# Patient Record
Sex: Female | Born: 1960 | Race: Black or African American | Hispanic: No | Smoking: Never smoker
Health system: Southern US, Community
[De-identification: ages and names within clinical notes are randomized; demographics above are authoritative.]

## PROBLEM LIST (undated history)

## (undated) DIAGNOSIS — G40909 Epilepsy, unspecified, not intractable, without status epilepticus: Secondary | ICD-10-CM

## (undated) DIAGNOSIS — J45909 Unspecified asthma, uncomplicated: Secondary | ICD-10-CM

## (undated) DIAGNOSIS — T7840XA Allergy, unspecified, initial encounter: Secondary | ICD-10-CM

## (undated) DIAGNOSIS — I10 Essential (primary) hypertension: Secondary | ICD-10-CM

## (undated) DIAGNOSIS — D649 Anemia, unspecified: Secondary | ICD-10-CM

## (undated) HISTORY — DX: Epilepsy, unspecified, not intractable, without status epilepticus: G40.909

## (undated) HISTORY — DX: Unspecified asthma, uncomplicated: J45.909

## (undated) HISTORY — DX: Essential (primary) hypertension: I10

## (undated) HISTORY — PX: BREAST SURGERY: SHX581

## (undated) HISTORY — DX: Allergy, unspecified, initial encounter: T78.40XA

## (undated) HISTORY — DX: Anemia, unspecified: D64.9

## (undated) HISTORY — PX: BREAST EXCISIONAL BIOPSY: SUR124

---

## 2004-04-02 ENCOUNTER — Ambulatory Visit: Payer: Self-pay

## 2004-04-10 ENCOUNTER — Ambulatory Visit: Payer: Self-pay

## 2004-10-10 ENCOUNTER — Ambulatory Visit: Payer: Self-pay | Admitting: General Surgery

## 2005-04-12 ENCOUNTER — Ambulatory Visit: Payer: Self-pay | Admitting: General Surgery

## 2005-07-08 ENCOUNTER — Emergency Department: Payer: Self-pay | Admitting: Unknown Physician Specialty

## 2005-07-08 ENCOUNTER — Other Ambulatory Visit: Payer: Self-pay

## 2006-04-16 ENCOUNTER — Ambulatory Visit: Payer: Self-pay | Admitting: General Surgery

## 2006-05-07 ENCOUNTER — Ambulatory Visit: Payer: Self-pay

## 2006-09-30 DIAGNOSIS — D509 Iron deficiency anemia, unspecified: Secondary | ICD-10-CM | POA: Insufficient documentation

## 2007-01-11 ENCOUNTER — Other Ambulatory Visit: Payer: Self-pay

## 2007-01-11 ENCOUNTER — Emergency Department: Payer: Self-pay | Admitting: Emergency Medicine

## 2007-04-21 ENCOUNTER — Ambulatory Visit: Payer: Self-pay | Admitting: General Surgery

## 2007-06-11 ENCOUNTER — Emergency Department: Payer: Self-pay | Admitting: Emergency Medicine

## 2007-06-11 ENCOUNTER — Other Ambulatory Visit: Payer: Self-pay

## 2007-06-15 ENCOUNTER — Ambulatory Visit: Payer: Self-pay | Admitting: Family Medicine

## 2007-06-15 ENCOUNTER — Ambulatory Visit: Payer: Self-pay | Admitting: Emergency Medicine

## 2007-08-10 ENCOUNTER — Ambulatory Visit: Payer: Self-pay | Admitting: Oncology

## 2007-08-11 ENCOUNTER — Ambulatory Visit: Payer: Self-pay | Admitting: Oncology

## 2007-09-06 ENCOUNTER — Emergency Department (HOSPITAL_COMMUNITY): Admission: EM | Admit: 2007-09-06 | Discharge: 2007-09-06 | Payer: Self-pay | Admitting: Emergency Medicine

## 2007-09-09 ENCOUNTER — Ambulatory Visit: Payer: Self-pay | Admitting: Oncology

## 2007-09-22 ENCOUNTER — Emergency Department: Payer: Self-pay | Admitting: Emergency Medicine

## 2007-09-22 ENCOUNTER — Other Ambulatory Visit: Payer: Self-pay

## 2007-10-10 ENCOUNTER — Ambulatory Visit: Payer: Self-pay | Admitting: Oncology

## 2007-12-10 ENCOUNTER — Ambulatory Visit: Payer: Self-pay | Admitting: Oncology

## 2007-12-28 ENCOUNTER — Ambulatory Visit: Payer: Self-pay | Admitting: Family Medicine

## 2008-01-05 ENCOUNTER — Ambulatory Visit: Payer: Self-pay | Admitting: Oncology

## 2008-01-10 ENCOUNTER — Ambulatory Visit: Payer: Self-pay | Admitting: Oncology

## 2008-01-13 ENCOUNTER — Ambulatory Visit: Payer: Self-pay | Admitting: Family Medicine

## 2008-02-25 ENCOUNTER — Emergency Department: Payer: Self-pay | Admitting: Emergency Medicine

## 2008-03-11 ENCOUNTER — Ambulatory Visit: Payer: Self-pay | Admitting: Oncology

## 2008-04-05 ENCOUNTER — Ambulatory Visit: Payer: Self-pay | Admitting: Oncology

## 2008-04-11 ENCOUNTER — Ambulatory Visit: Payer: Self-pay | Admitting: Oncology

## 2008-04-13 ENCOUNTER — Ambulatory Visit: Payer: Self-pay | Admitting: General Surgery

## 2008-04-20 ENCOUNTER — Ambulatory Visit: Payer: Self-pay | Admitting: General Surgery

## 2008-05-19 ENCOUNTER — Emergency Department: Payer: Self-pay

## 2008-06-09 ENCOUNTER — Ambulatory Visit: Payer: Self-pay | Admitting: Oncology

## 2008-07-05 ENCOUNTER — Ambulatory Visit: Payer: Self-pay | Admitting: Oncology

## 2008-07-09 ENCOUNTER — Ambulatory Visit: Payer: Self-pay | Admitting: Oncology

## 2008-09-08 ENCOUNTER — Ambulatory Visit: Payer: Self-pay | Admitting: Oncology

## 2008-10-04 ENCOUNTER — Ambulatory Visit: Payer: Self-pay | Admitting: Oncology

## 2008-10-09 ENCOUNTER — Ambulatory Visit: Payer: Self-pay | Admitting: Oncology

## 2009-03-11 ENCOUNTER — Ambulatory Visit: Payer: Self-pay | Admitting: Oncology

## 2009-04-06 ENCOUNTER — Ambulatory Visit: Payer: Self-pay | Admitting: Oncology

## 2009-04-11 ENCOUNTER — Ambulatory Visit: Payer: Self-pay | Admitting: Oncology

## 2009-04-14 ENCOUNTER — Ambulatory Visit: Payer: Self-pay | Admitting: General Surgery

## 2009-09-08 ENCOUNTER — Ambulatory Visit: Payer: Self-pay | Admitting: Oncology

## 2009-10-04 ENCOUNTER — Ambulatory Visit: Payer: Self-pay | Admitting: Oncology

## 2009-10-09 ENCOUNTER — Ambulatory Visit: Payer: Self-pay | Admitting: Oncology

## 2009-11-09 ENCOUNTER — Ambulatory Visit: Payer: Self-pay | Admitting: Oncology

## 2009-11-29 ENCOUNTER — Ambulatory Visit: Payer: Self-pay | Admitting: Oncology

## 2009-11-29 LAB — HM PAP SMEAR: HM PAP: NORMAL

## 2009-12-09 ENCOUNTER — Ambulatory Visit: Payer: Self-pay | Admitting: Oncology

## 2010-02-28 ENCOUNTER — Ambulatory Visit: Payer: Self-pay | Admitting: Oncology

## 2010-03-11 ENCOUNTER — Ambulatory Visit: Payer: Self-pay | Admitting: Oncology

## 2010-05-30 ENCOUNTER — Ambulatory Visit: Payer: Self-pay | Admitting: Oncology

## 2010-06-10 ENCOUNTER — Ambulatory Visit: Payer: Self-pay | Admitting: Oncology

## 2010-06-20 ENCOUNTER — Ambulatory Visit: Payer: Self-pay | Admitting: Family Medicine

## 2010-06-28 ENCOUNTER — Ambulatory Visit: Payer: Self-pay | Admitting: Family Medicine

## 2010-07-16 ENCOUNTER — Encounter: Payer: Self-pay | Admitting: Family Medicine

## 2010-07-24 NOTE — Assessment & Plan Note (Signed)
NAME:  Paula [name], Paula [name]               ACCOUNT NO.:  [id number]   MEDICAL RECORD NO.:  [id number]          PATIENT TYPE:  POB   LOCATION:  [facility] at [facility]         FACILITY:  [facility]   PHYSICIAN:  Tinnie Gens, MD        DATE OF BIRTH:  September 14, 1960   DATE OF SERVICE:  12/28/2007                                  CLINIC NOTE   CHIEF COMPLAINT:  Yearly exam.   HISTORY OF PRESENT ILLNESS:  The patient is a 50 year old nulligravida  who has a history of epilepsy.  She has had IUD in for the past 5 years.  She reports IUD was placed for abnormal uterine bleeding, iron  deficiency anemia, and this was worked really well.  She has an  occasional bleeding, but not really a lot.  She is interested in full  hysterectomy.  She is not currently sexually active as her partner has  diabetes and is unable to perform.  The patient would be issued in  replacement of her IUD.   PAST MEDICAL HISTORY:  Significant for:  1. Epilepsy.  2. Asthma.  3. Sleep apnea.   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  This continues to be followed with  annual mammograms by a general surgeon and breast exams.  Her last one  was in August 2009.   MEDICATIONS:  1. She is on Keppra 2 p.o. b.i.d.  2. Tegretol 1 p.o. t.i.d.  3. Topamax 1 p.o. t.i.d.  4. Lamictal 1 p.o. t.i.d.  She is currently weaning the Lamictal to up      and increasing the Keppra to help her with seizure control.   ALLERGIES:  None known.   OBSTETRICAL HISTORY:  She is P0 and G1, history of menarche at age 32.  She has IUD in place.  No history of abnormal Pap smear.  Last was  approximately 2 years ago.   FAMILY HISTORY:  Hypertension in her mother and breast cancer in her  mother diagnosed at age 1.   SOCIAL HISTORY:  The patient is worked for a while.  She does not smoke  or drink alcohol.  She does drink caffeinated beverages.   REVIEW OF SYSTEMS:  A 14-point review of  systems is reviewed.  Please  see GYN history in the chart.  She reports a little bit of bruising and  some coughing up blood or phlegm.  She does have gums bleeding and she  is on CPAP and does have some nasal dryness.  She also reports loss of  urine, not necessarily with coughing or sneezing.  She founds more of  her problem when she really has to go the bathroom that she loses her  urine.   PHYSICAL EXAMINATION:  VITAL SIGNS:  Her vitals are as in the chart.  Weight is 287.5 and blood pressure 138/98.  GENERAL:  She is a morbidly obese female.  GU:  Normal external female genitalia.  BUS normal.  Vagina is pink and  rugated.  Cervix is nulliparous without lesion.  Uterus is small,  anteverted.  No adnexal mass or  tenderness, although exam is markedly  limited by body habitus.   PROCEDURE:  On visualization of the cervix, intrauterine device strings  are easily visualized.  Her intrauterine device is removed without  difficulty.  Cervix has been grasped anteriorly with single-toothed  tenaculum.  The uterus was sounded to 8 cm and intrauterine device was  placed inside the uterine cavity.  Strings were trimmed to 1.5 cm.  The  patient tolerated the procedure well.   IMPRESSION:  1. Yearly exam.  2. History of menorrhagia, well controlled with intrauterine device      status post removal and replacement of intrauterine device today.   PLAN:  Continue mammograms per surgery.  Check Pap smear results.  Follow up with primary care for blood pressure issues and other issues.  Return in 2 weeks for IUD string check.           ______________________________  Tinnie Gens, MD     TP/MEDQ  D:  12/28/2007  T:  12/29/2007  Job:  [id number]

## 2010-07-27 NOTE — Assessment & Plan Note (Signed)
NAME:  Paula [name], Paula [name]               ACCOUNT NO.:  [id number]   MEDICAL RECORD NO.:  [id number]          PATIENT TYPE:  POB   LOCATION:  [facility] at [facility]         FACILITY:  [facility]   PHYSICIAN:  Tinnie Gens, MD        DATE OF BIRTH:  July 06, 1960   DATE OF SERVICE:                                  CLINIC NOTE   CHIEF COMPLAINT:  Followup IUD.   HISTORY:  This patient is a 50 year old female who has an IUD in place.  She has had no bleeding since her IUD was inserted.  She says she feels  well and doing fine.  Unfortunately, her Pap smear was rejected by lab  course, so that will need to be rerun.   PHYSICAL EXAMINATION:  VITAL SIGNS:  Vitals are as in the chart.  GENERAL:  She is an obese female in no acute.  ABDOMEN:  Soft, nontender, nondistended.  GU:  Normal external female genitalia.  BUS normal.  Vagina is pink and  rugated.  Cervix is parous without lesions.  IUD strings are easily  visualized.   IMPRESSION:  1. Abnormal bleeding status post intrauterine device insertion.  2. Need for Pap smear screening.   PLAN:  Pap smear today.  Followup as needed for her IUD.           ______________________________  Tinnie Gens, MD     TP/MEDQ  D:  01/14/2008  T:  01/15/2008  Job:  [id number]

## 2010-08-10 ENCOUNTER — Encounter: Payer: Self-pay | Admitting: Family Medicine

## 2010-08-30 ENCOUNTER — Ambulatory Visit: Payer: Self-pay | Admitting: Oncology

## 2010-09-09 ENCOUNTER — Ambulatory Visit: Payer: Self-pay | Admitting: Oncology

## 2010-11-22 ENCOUNTER — Ambulatory Visit: Payer: Self-pay | Admitting: Oncology

## 2010-12-10 ENCOUNTER — Ambulatory Visit: Payer: Self-pay | Admitting: Oncology

## 2011-02-21 ENCOUNTER — Ambulatory Visit: Payer: Self-pay | Admitting: Oncology

## 2011-03-12 ENCOUNTER — Ambulatory Visit: Payer: Self-pay | Admitting: Oncology

## 2011-05-14 ENCOUNTER — Emergency Department: Payer: Self-pay | Admitting: Emergency Medicine

## 2011-05-14 LAB — CBC
HCT: 30.5 % — ABNORMAL LOW (ref 35.0–47.0)
MCH: 29.1 pg (ref 26.0–34.0)
MCV: 87 fL (ref 80–100)
WBC: 3.9 10*3/uL (ref 3.6–11.0)

## 2011-05-15 LAB — COMPREHENSIVE METABOLIC PANEL
Albumin: 3.5 g/dL (ref 3.4–5.0)
Anion Gap: 13 (ref 7–16)
BUN: 16 mg/dL (ref 7–18)
Creatinine: 0.94 mg/dL (ref 0.60–1.30)
EGFR (African American): >60
Glucose: 93 mg/dL (ref 65–99)
Osmolality: 288 (ref 275–301)
Potassium: 4 mmol/L (ref 3.5–5.1)
SGOT(AST): 13 U/L — ABNORMAL LOW (ref 15–37)
Total Protein: 7.1 g/dL (ref 6.4–8.2)

## 2011-05-15 LAB — CK TOTAL AND CKMB (NOT AT ARMC)
CK, Total: 94 U/L (ref 21–215)
CK-MB: 0.7 ng/mL (ref 0.5–3.6)

## 2011-05-16 ENCOUNTER — Ambulatory Visit: Payer: Self-pay | Admitting: Oncology

## 2011-06-10 ENCOUNTER — Ambulatory Visit: Payer: Self-pay | Admitting: Oncology

## 2011-06-17 ENCOUNTER — Ambulatory Visit: Payer: Self-pay | Admitting: Internal Medicine

## 2011-08-19 ENCOUNTER — Ambulatory Visit: Payer: Self-pay | Admitting: Oncology

## 2011-09-09 ENCOUNTER — Ambulatory Visit: Payer: Self-pay | Admitting: Oncology

## 2011-09-16 ENCOUNTER — Ambulatory Visit: Payer: Self-pay | Admitting: Family Medicine

## 2011-11-18 ENCOUNTER — Ambulatory Visit: Payer: Self-pay | Admitting: Oncology

## 2011-12-10 ENCOUNTER — Ambulatory Visit: Payer: Self-pay | Admitting: Oncology

## 2012-02-17 ENCOUNTER — Ambulatory Visit: Payer: Self-pay | Admitting: Oncology

## 2012-03-11 ENCOUNTER — Ambulatory Visit: Payer: Self-pay | Admitting: Oncology

## 2012-05-09 ENCOUNTER — Ambulatory Visit: Payer: Self-pay | Admitting: Oncology

## 2012-06-09 ENCOUNTER — Ambulatory Visit: Payer: Self-pay | Admitting: Oncology

## 2012-08-09 ENCOUNTER — Ambulatory Visit: Payer: Self-pay | Admitting: Oncology

## 2012-09-08 ENCOUNTER — Ambulatory Visit: Payer: Self-pay | Admitting: Oncology

## 2012-11-23 ENCOUNTER — Ambulatory Visit: Payer: Self-pay | Admitting: Family Medicine

## 2012-12-28 ENCOUNTER — Ambulatory Visit: Payer: Self-pay | Admitting: Oncology

## 2013-01-09 ENCOUNTER — Ambulatory Visit: Payer: Self-pay | Admitting: Oncology

## 2013-04-09 ENCOUNTER — Emergency Department: Payer: Self-pay | Admitting: Emergency Medicine

## 2013-04-09 LAB — CBC
HCT: 35 % (ref 35.0–47.0)
HGB: 11.5 g/dL — ABNORMAL LOW (ref 12.0–16.0)
MCH: 29.1 pg (ref 26.0–34.0)
MCHC: 32.8 g/dL (ref 32.0–36.0)
MCV: 89 fL (ref 80–100)
Platelet: 204 10*3/uL (ref 150–440)
RBC: 3.95 10*6/uL (ref 3.80–5.20)
RDW: 14.1 % (ref 11.5–14.5)
WBC: 4.1 10*3/uL (ref 3.6–11.0)

## 2013-04-09 LAB — BASIC METABOLIC PANEL
Anion Gap: 6 — ABNORMAL LOW (ref 7–16)
BUN: 14 mg/dL (ref 7–18)
CO2: 27 mmol/L (ref 21–32)
CREATININE: 1.01 mg/dL (ref 0.60–1.30)
Calcium, Total: 8.9 mg/dL (ref 8.5–10.1)
Chloride: 105 mmol/L (ref 98–107)
EGFR (African American): >60
Glucose: 97 mg/dL (ref 65–99)
OSMOLALITY: 276 (ref 275–301)
Potassium: 3.6 mmol/L (ref 3.5–5.1)
SODIUM: 138 mmol/L (ref 136–145)

## 2013-04-09 LAB — TROPONIN I: Troponin-I: <0.02 ng/mL

## 2013-04-10 LAB — TROPONIN I

## 2013-04-26 ENCOUNTER — Ambulatory Visit: Payer: Self-pay | Admitting: Oncology

## 2013-05-09 ENCOUNTER — Ambulatory Visit: Payer: Self-pay | Admitting: Oncology

## 2013-05-11 ENCOUNTER — Ambulatory Visit: Payer: Self-pay | Admitting: Gastroenterology

## 2013-05-11 LAB — HM COLONOSCOPY

## 2013-06-16 DIAGNOSIS — M7502 Adhesive capsulitis of left shoulder: Secondary | ICD-10-CM | POA: Insufficient documentation

## 2013-06-16 DIAGNOSIS — G40909 Epilepsy, unspecified, not intractable, without status epilepticus: Secondary | ICD-10-CM | POA: Insufficient documentation

## 2013-06-16 DIAGNOSIS — G4733 Obstructive sleep apnea (adult) (pediatric): Secondary | ICD-10-CM | POA: Insufficient documentation

## 2013-06-16 DIAGNOSIS — G40919 Epilepsy, unspecified, intractable, without status epilepticus: Secondary | ICD-10-CM | POA: Insufficient documentation

## 2013-07-26 ENCOUNTER — Ambulatory Visit: Payer: Self-pay | Admitting: Oncology

## 2013-08-09 ENCOUNTER — Ambulatory Visit: Payer: Self-pay | Admitting: Oncology

## 2013-11-05 ENCOUNTER — Ambulatory Visit: Payer: Self-pay | Admitting: Oncology

## 2014-02-21 ENCOUNTER — Ambulatory Visit: Payer: Self-pay | Admitting: Oncology

## 2014-03-11 ENCOUNTER — Ambulatory Visit: Payer: Self-pay | Admitting: Oncology

## 2014-05-10 LAB — LIPID PANEL
Cholesterol: 229 mg/dL — AB (ref 0–200)
HDL: 102 mg/dL — AB (ref 35–70)
LDL Cholesterol: 113 mg/dL
Triglycerides: 71 mg/dL (ref 40–160)

## 2014-05-10 LAB — HEMOGLOBIN A1C: HEMOGLOBIN A1C: 5.4 % (ref 4.0–6.0)

## 2014-05-16 ENCOUNTER — Ambulatory Visit: Payer: Self-pay | Admitting: Family Medicine

## 2014-05-16 LAB — HM MAMMOGRAPHY: HM Mammogram: NORMAL

## 2014-06-28 ENCOUNTER — Encounter: Payer: Self-pay | Admitting: Family Medicine

## 2014-07-04 ENCOUNTER — Ambulatory Visit
Admit: 2014-07-04 | Disposition: A | Discharge status: Home / Self Care | Payer: Self-pay | Attending: Oncology | Admitting: Oncology

## 2014-08-15 ENCOUNTER — Encounter: Payer: Self-pay | Admitting: Family Medicine

## 2014-08-15 ENCOUNTER — Ambulatory Visit (INDEPENDENT_AMBULATORY_CARE_PROVIDER_SITE_OTHER): Payer: 59 | Admitting: Family Medicine

## 2014-08-15 VITALS — BP 134/86 | HR 88 | Temp 97.6°F | Resp 16 | Ht 62.0 in | Wt 294.1 lb

## 2014-08-15 DIAGNOSIS — J309 Allergic rhinitis, unspecified: Secondary | ICD-10-CM | POA: Insufficient documentation

## 2014-08-15 DIAGNOSIS — E559 Vitamin D deficiency, unspecified: Secondary | ICD-10-CM | POA: Insufficient documentation

## 2014-08-15 DIAGNOSIS — J3089 Other allergic rhinitis: Secondary | ICD-10-CM | POA: Diagnosis not present

## 2014-08-15 DIAGNOSIS — G40909 Epilepsy, unspecified, not intractable, without status epilepticus: Secondary | ICD-10-CM

## 2014-08-15 DIAGNOSIS — I10 Essential (primary) hypertension: Secondary | ICD-10-CM | POA: Diagnosis not present

## 2014-08-15 DIAGNOSIS — J4520 Mild intermittent asthma, uncomplicated: Secondary | ICD-10-CM | POA: Diagnosis not present

## 2014-08-15 DIAGNOSIS — I6789 Other cerebrovascular disease: Secondary | ICD-10-CM

## 2014-08-15 DIAGNOSIS — N920 Excessive and frequent menstruation with regular cycle: Secondary | ICD-10-CM | POA: Insufficient documentation

## 2014-08-15 DIAGNOSIS — E668 Other obesity: Secondary | ICD-10-CM

## 2014-08-15 DIAGNOSIS — A6009 Herpesviral infection of other urogenital tract: Secondary | ICD-10-CM | POA: Insufficient documentation

## 2014-08-15 DIAGNOSIS — R569 Unspecified convulsions: Secondary | ICD-10-CM | POA: Insufficient documentation

## 2014-08-15 MED ORDER — AMLODIPINE BESYLATE 2.5 MG PO TABS: 2.5000 mg | ORAL_TABLET | Freq: Every day | ORAL | Status: DC

## 2014-08-15 MED ORDER — LOSARTAN POTASSIUM-HCTZ 100-12.5 MG PO TABS: 1.0000 | ORAL_TABLET | Freq: Every day | ORAL | Status: DC

## 2014-08-15 NOTE — Progress Notes (Signed)
Name: Paula CombesCynthia L [name]   MRN: [id number]    DOB: 1961/01/28   Date:08/15/2014       Progress Note  Subjective  Chief Complaint  Chief Complaint  Patient presents with  . Medication Refill    3 month F/U  . Asthma    Improving  . Hypertension  . Allergic Rhinitis     HPI  HTN: taking medication, and denies side effects, and is compliant with Losatan/hctz and amlodipine. BP has been at goal during her visits, but she has not been checking bp at home  Seizure disorder: she is driving again since June 2016, she has been seizure free over the past few years. She is compliant with medications, and denies side effects [facility]  Asthma : mild intermittently, she has been asymptomatic and is not using any medications.   Allergic Rhinitis: she never started hydroxyzine, using nasal spray and loratatdine Patient Active Problem List   Diagnosis Date Noted  . Asthma, mild intermittent 08/15/2014  . Benign hypertension 08/15/2014  . Herpes 08/15/2014  . Excess, menstruation 08/15/2014  . Allergic rhinitis 08/15/2014  . Cerebral seizure 08/15/2014  . Avitaminosis D 08/15/2014  . Extreme obesity 08/03/2014  . Adhesive capsulitis 06/16/2013  . Obstructive sleep apnea of adult 06/16/2013  . Anemia, iron deficiency 09/30/2006    History reviewed. No pertinent past surgical history.  Family History  Problem Relation Age of Onset  . Cancer Mother     Breast  . Hypertension Mother   . Diabetes Mother   . Hyperlipidemia Mother     History   Social History  . Marital Status: Single    Spouse Name: N/A  . Number of Children: N/A  . Years of Education: N/A   Occupational History  . Not on file.   Social History Main Topics  . Smoking status: Never Smoker   . Smokeless tobacco: Not on file  . Alcohol Use: No  . Drug Use: No  . Sexual Activity: Not Currently   Other Topics Concern  . Not on file   Social History Narrative  . No narrative on file      Current outpatient prescriptions:  .  albuterol (PROVENTIL HFA;VENTOLIN HFA) 108 (90 BASE) MCG/ACT inhaler, Inhale 2 puffs into the lungs every 6 (six) hours as needed., Disp: , Rfl:  .  amLODipine (NORVASC) 2.5 MG tablet, Take 1 tablet by mouth daily., Disp: , Rfl:  .  budesonide-formoterol (SYMBICORT) 80-4.5 MCG/ACT inhaler, Inhale 2 puffs into the lungs as needed., Disp: , Rfl:  .  carbamazepine (TEGRETOL) 200 MG tablet, TEGRETOL, 200MG  (Oral Tablet)  one a half m and afternoon and 2 and half qhs for 0 days  Quantity: 1.00;  Refills: 0   Ordered :26-Jan-2010  Alba CorySOWLES, Raquell Richer MD;  Started 14-Mar-2008 Active, Disp: , Rfl:  .  fluticasone (FLONASE) 50 MCG/ACT nasal spray, Place 1 spray into both nostrils daily., Disp: , Rfl:  .  hydrOXYzine (ATARAX/VISTARIL) 25 MG tablet, Take 1 tablet by mouth every evening., Disp: , Rfl:  .  levETIRAcetam (KEPPRA) 500 MG tablet, Take 3 tablets by mouth 2 (two) times daily., Disp: , Rfl:  .  loratadine (CLARITIN) 10 MG tablet, Take 1 tablet by mouth 2 (two) times daily as needed., Disp: , Rfl:  .  losartan-hydrochlorothiazide (HYZAAR) 100-12.5 MG per tablet, Take 1 tablet by mouth daily., Disp: , Rfl:  .  topiramate (TOPAMAX) 100 MG tablet, Take 3 tablets by mouth 3 (three) times daily.,  Disp: , Rfl:  .  valACYclovir (VALTREX) 500 MG tablet, Take 1 tablet by mouth daily., Disp: , Rfl:   Allergies  Allergen Reactions  . Ace Inhibitors      ROS  Constitutional: Negative for fever or weight change.  Respiratory: Negative for cough and shortness of breath.   Cardiovascular: Negative for chest pain or palpitations.  Gastrointestinal: Negative for abdominal pain, no bowel changes.  Musculoskeletal: Negative for gait problem or joint swelling.  Skin: Negative for rash.  Neurological: Negative for dizziness or headache.  No other specific complaints in a complete review of systems (except as listed in HPI above).  Objective  Filed Vitals:   08/15/14  1630  BP: 134/86  Pulse: 88  Temp: 97.6 F (36.4 C)  TempSrc: Oral  Resp: 16  Height:  (1.575 m)  Weight: 294 lb 1.6 oz (133.403 kg)  SpO2: 97%    Physical Exam   Constitutional: Patient appears well-developed and morbidly obese. No distress.  HENT: Head: Normocephalic and atraumatic. Nose: Nose normal. Mouth/Throat: Oropharynx is clear and moist. No oropharyngeal exudate.  Eyes: Conjunctivae and EOM are normal. Pupils are equal, round, and reactive to light. No scleral icterus.  Neck: Normal range of motion. Neck supple. No JVD present. No thyromegaly present.  Cardiovascular: Normal rate, regular rhythm and normal heart sounds.  No murmur heard. No BLE edema. Pulmonary/Chest: Effort normal and breath sounds normal. No respiratory distress. Abdominal: Soft. Bowel sounds are normal, no distension. There is no tenderness. no masses Musculoskeletal: Normal range of motion, no joint effusions. No gross deformities Neurological: he is alert and oriented to person, place, and time. No cranial nerve deficit. Coordination, balance, strength, speech and gait are normal.  Skin: Skin is warm and dry. No rash noted. No erythema.  Psychiatric: Patient has a normal mood and affect. behavior is normal. Judgment and thought content normal.  Depression screen PHQ 2/9 08/15/2014  Decreased Interest 0  Down, Depressed, Hopeless 0  PHQ - 2 Score 0   Fall Risk: Fall Risk  08/15/2014  Falls in the past year? No     Assessment & Plan   1. Asthma, mild intermittent, uncomplicated Doing well, off medication now and spirometry within normal limits  - Spirometry: Peak  2. Other allergic rhinitis Doing well at this time  3. Cerebral seizure Continue follow up with [facility], she states she gets labs done for them on a regular basis  4. Extreme obesity Discussed importance of weight loss   5. Essential hypertension bp is at goal, recheck labs - Lipid panel - Comprehensive metabolic panel -  CBC with Differential - Hemoglobin A1c

## 2014-08-15 NOTE — Patient Instructions (Signed)

## 2014-09-23 ENCOUNTER — Ambulatory Visit (INDEPENDENT_AMBULATORY_CARE_PROVIDER_SITE_OTHER): Payer: 59 | Admitting: Family Medicine

## 2014-09-23 ENCOUNTER — Encounter: Payer: Self-pay | Admitting: Family Medicine

## 2014-09-23 VITALS — BP 132/82 | HR 66 | Temp 97.5°F | Resp 18 | Ht 62.0 in | Wt 296.0 lb

## 2014-09-23 DIAGNOSIS — I10 Essential (primary) hypertension: Secondary | ICD-10-CM

## 2014-09-23 DIAGNOSIS — Z01419 Encounter for gynecological examination (general) (routine) without abnormal findings: Secondary | ICD-10-CM

## 2014-09-23 DIAGNOSIS — Z0000 Encounter for general adult medical examination without abnormal findings: Secondary | ICD-10-CM

## 2014-09-23 DIAGNOSIS — Z30432 Encounter for removal of intrauterine contraceptive device: Secondary | ICD-10-CM

## 2014-09-23 DIAGNOSIS — Z124 Encounter for screening for malignant neoplasm of cervix: Secondary | ICD-10-CM

## 2014-09-23 DIAGNOSIS — N912 Amenorrhea, unspecified: Secondary | ICD-10-CM

## 2014-09-23 DIAGNOSIS — E559 Vitamin D deficiency, unspecified: Secondary | ICD-10-CM

## 2014-09-23 DIAGNOSIS — D509 Iron deficiency anemia, unspecified: Secondary | ICD-10-CM

## 2014-09-23 DIAGNOSIS — I8393 Asymptomatic varicose veins of bilateral lower extremities: Secondary | ICD-10-CM | POA: Insufficient documentation

## 2014-09-23 DIAGNOSIS — Z8742 Personal history of other diseases of the female genital tract: Secondary | ICD-10-CM

## 2014-09-23 NOTE — Patient Instructions (Signed)
Discussed importance of 150 minutes of physical activity weekly, eat two servings of fish weekly, eat one serving of tree nuts ( cashews, pistachios, pecans, almonds..) every other day, eat 6 servings of fruit/vegetables daily and drink plenty of water and avoid sweet beverages. 

## 2014-09-23 NOTE — Progress Notes (Signed)
Name: Paula [name]   MRN: [id number]    DOB: [birth year]   Date:09/23/2014       Progress Note  Subjective  Chief Complaint  Chief Complaint  Patient presents with  . Annual Exam    HPI  Well Woman Exam:   She is not sexually active, has IUD because of history of menorrhagia that caused anemia. She is due for replacement of IUD. She denies any spotting at this time.  Mammogram is up to date, colonoscopy is up to date, she is due for repeat pap smear.  Patient Active Problem List   Diagnosis Date Noted  . 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 Asthma, mild intermittent 08/15/2014  . Benign hypertension 08/15/2014  . Herpes genitalis in women 08/15/2014  . Allergic rhinitis 08/15/2014  . Cerebral seizure 08/15/2014  . Vitamin D deficiency 08/15/2014  . Extreme obesity 08/03/2014  . Adhesive capsulitis of left shoulder 06/16/2013  . Obstructive sleep apnea of adult 06/16/2013  . Anemia, iron deficiency 09/30/2006    History reviewed. No pertinent past surgical history.  Family History  Problem Relation Age of Onset  . Cancer Mother     Breast  . Hypertension Mother   . Diabetes Mother   . Hyperlipidemia Mother     History   Social History  . Marital Status: Single    Spouse Name: N/A  . Number of Children: N/A  . Years of Education: N/A   Occupational History  . Not on file.   Social History Main Topics  . Smoking status: Never Smoker   . Smokeless tobacco: Not on file  . Alcohol Use: No  . Drug Use: No  . Sexual Activity: Not Currently   Other Topics Concern  . Not on file   Social History Narrative     Current outpatient prescriptions:  .  albuterol (PROVENTIL HFA;VENTOLIN HFA) 108 (90 BASE) MCG/ACT inhaler, Inhale 2 puffs into the lungs every 6 (six) hours as needed., Disp: , Rfl:  .  amLODipine (NORVASC) 2.5 MG tablet, Take 1 tablet (2.5 mg total) by mouth daily., Disp: 90 tablet, Rfl: 3 .   budesonide-formoterol (SYMBICORT) 80-4.5 MCG/ACT inhaler, Inhale 2 puffs into the lungs as needed., Disp: , Rfl:  .  carbamazepine (TEGRETOL) 200 MG tablet, TEGRETOL,  (Oral Tablet)  one a half m and afternoon and 2 and half qhs for 0 days  Quantity: 1.00;  Refills: 0   Ordered :26-Jan-2010  Alba Cory MD;  Started 14-Mar-2008 Active, Disp: , Rfl:  .  fluticasone (FLONASE) 50 MCG/ACT nasal spray, Place 1 spray into both nostrils daily., Disp: , Rfl:  .  hydrOXYzine (ATARAX/VISTARIL) 25 MG tablet, Take 1 tablet by mouth every evening., Disp: , Rfl:  .  levETIRAcetam (KEPPRA) 500 MG tablet, Take 3 tablets by mouth 2 (two) times daily., Disp: , Rfl:  .  loratadine (CLARITIN) 10 MG tablet, Take 1 tablet by mouth 2 (two) times daily as needed., Disp: , Rfl:  .  losartan-hydrochlorothiazide (HYZAAR) 100-12.5 MG per tablet, Take 1 tablet by mouth daily., Disp: 90 tablet, Rfl: 3 .  topiramate (TOPAMAX) 100 MG tablet, Take 3 tablets by mouth 3 (three) times daily., Disp: , Rfl:  .  valACYclovir (VALTREX) 500 MG tablet, Take 1 tablet by mouth daily., Disp: , Rfl:   Allergies  Allergen Reactions  . Ace Inhibitors      ROS  Constitutional: Negative for fever or significant weight change.  Respiratory:  Negative for cough and shortness of breath.   Cardiovascular: Negative for chest pain or palpitations.  Gastrointestinal: Negative for abdominal pain, no bowel changes.  Musculoskeletal: Negative for gait problem or joint swelling.  Skin: Negative for rash.  Neurological: Negative for dizziness or headache.  No other specific complaints in a complete review of systems (except as listed in HPI above).  Objective  Filed Vitals:   09/23/14 1420  BP: 132/82  Pulse: 66  Temp: 97.5 F (36.4 C)  TempSrc: Oral  Resp: 18  Height: 5\' 2"  (1.575 m)  Weight: 296 lb (134.265 kg)  SpO2: 94%    Body mass index is 54.13 kg/(m^2).  Physical Exam  Constitutional: Patient appears well-developed  and obese No distress.  HENT: Head: Normocephalic and atraumatic. Ears: B TMs ok, no erythema or effusion; Nose: Nose normal. Mouth/Throat: Oropharynx is clear and moist. No oropharyngeal exudate.  Eyes: Conjunctivae and EOM are normal. Pupils are equal, round, and reactive to light. No scleral icterus.  Neck: Normal range of motion. Neck supple. No JVD present. No thyromegaly present.  Cardiovascular: Normal rate, regular rhythm and normal heart sounds.  No murmur heard. No BLE edema. She has varicose veins on both legs Pulmonary/Chest: Effort normal and breath sounds normal. No respiratory distress. Abdominal: Soft. Bowel sounds are normal, no distension. There is no tenderness. no masses Breast: no lumps or masses, no nipple discharge or rashes FEMALE GENITALIA:  External genitalia normal External urethra normal Vaginal vault normal without discharge or lesions Cervix normal without discharge or lesions Bimanual exam normal without masses RECTAL: not done Musculoskeletal: Normal range of motion, no joint effusions. No gross deformities Neurological: he is alert and oriented to person, place, and time. No cranial nerve deficit. Coordination, balance, strength, speech and gait are normal.  Skin: Skin is warm and dry. No rash noted. No erythema.  Psychiatric: Patient has a normal mood and affect. behavior is normal. Judgment and thought content normal.    PHQ2/9: Depression screen PHQ 2/9 08/15/2014  Decreased Interest 0  Down, Depressed, Hopeless 0  PHQ - 2 Score 0    Fall Risk: Fall Risk  08/15/2014  Falls in the past year? No     Assessment & Plan   1. Well woman exam Discussed importance of 150 minutes of physical activity weekly, eat two servings of fish weekly, eat one serving of tree nuts ( cashews, pistachios, pecans, almonds.Marland Kitchen.) every other day, eat 6 servings of fruit/vegetables daily and drink plenty of water and avoid sweet beverages.   2. Anemia, iron deficiency  -  Ferritin - CBC with Differential  3. Benign hypertension  - Comprehensive Metabolic Panel (CMET)  4. Vitamin D deficiency  - Vitamin D (25 hydroxy)  5. Cervical cancer screening  - Cytology - PAP  6. History of menorrhagia  But no spotting or bleeding in over one year, we will remove IUD and check labs  7. Amenorrhea - FSH - LH She would like to have another IUD placed, but per patient the current Mirena IUD was placed over 5years ago and she has been amenorrheic for over one year   8. Encounter for IUD removal Patient gave consent to remove IUD Area was prepped with betadine  IUD removed with ring forceps Patient tolerated procedure well No complications

## 2014-09-24 LAB — CBC WITH DIFFERENTIAL/PLATELET
BASOS ABS: 0 10*3/uL (ref 0.0–0.2)
Basos: 0 %
EOS (ABSOLUTE): 0.2 10*3/uL (ref 0.0–0.4)
EOS: 5 %
Hematocrit: 33.1 % — ABNORMAL LOW (ref 34.0–46.6)
Hemoglobin: 10.8 g/dL — ABNORMAL LOW (ref 11.1–15.9)
IMMATURE GRANS (ABS): 0 10*3/uL (ref 0.0–0.1)
IMMATURE GRANULOCYTES: 0 %
LYMPHS ABS: 1.1 10*3/uL (ref 0.7–3.1)
LYMPHS: 28 %
MCH: 27.6 pg (ref 26.6–33.0)
MCHC: 32.6 g/dL (ref 31.5–35.7)
MCV: 84 fL (ref 79–97)
MONOCYTES: 7 %
Monocytes Absolute: 0.3 10*3/uL (ref 0.1–0.9)
NEUTROS PCT: 60 %
Neutrophils Absolute: 2.4 10*3/uL (ref 1.4–7.0)
PLATELETS: 229 10*3/uL (ref 150–379)
RBC: 3.92 x10E6/uL (ref 3.77–5.28)
RDW: 14.4 % (ref 12.3–15.4)
WBC: 4.1 10*3/uL (ref 3.4–10.8)

## 2014-09-24 LAB — COMPREHENSIVE METABOLIC PANEL
ALT: 11 IU/L (ref 0–32)
AST: 14 IU/L (ref 0–40)
Albumin/Globulin Ratio: 1.8 (ref 1.1–2.5)
Albumin: 4.5 g/dL (ref 3.5–5.5)
Alkaline Phosphatase: 129 IU/L — ABNORMAL HIGH (ref 39–117)
BILIRUBIN TOTAL: 0.2 mg/dL (ref 0.0–1.2)
BUN / CREAT RATIO: 13 (ref 9–23)
BUN: 13 mg/dL (ref 6–24)
CHLORIDE: 104 mmol/L (ref 97–108)
CO2: 23 mmol/L (ref 18–29)
Calcium: 10 mg/dL (ref 8.7–10.2)
Creatinine, Ser: 0.98 mg/dL (ref 0.57–1.00)
GFR calc non Af Amer: 67 mL/min/{1.73_m2} (ref >59)
GFR, EST AFRICAN AMERICAN: 77 mL/min/{1.73_m2} (ref >59)
Globulin, Total: 2.5 g/dL (ref 1.5–4.5)
Glucose: 92 mg/dL (ref 65–99)
POTASSIUM: 4.5 mmol/L (ref 3.5–5.2)
SODIUM: 144 mmol/L (ref 134–144)
Total Protein: 7 g/dL (ref 6.0–8.5)

## 2014-09-24 LAB — LUTEINIZING HORMONE: LH: 38.4 m[IU]/mL

## 2014-09-24 LAB — VITAMIN D 25 HYDROXY (VIT D DEFICIENCY, FRACTURES): VIT D 25 HYDROXY: 6.3 ng/mL — AB (ref 30.0–100.0)

## 2014-09-24 LAB — FERRITIN: Ferritin: 127 ng/mL (ref 15–150)

## 2014-09-24 LAB — FOLLICLE STIMULATING HORMONE: FSH: 80.7 m[IU]/mL

## 2014-09-25 ENCOUNTER — Other Ambulatory Visit: Payer: Self-pay | Admitting: Family Medicine

## 2014-09-25 DIAGNOSIS — E559 Vitamin D deficiency, unspecified: Secondary | ICD-10-CM

## 2014-09-25 MED ORDER — VITAMIN D (ERGOCALCIFEROL) 1.25 MG (50000 UNIT) PO CAPS: 50000.0000 [IU] | ORAL_CAPSULE | ORAL | Status: DC

## 2014-09-26 ENCOUNTER — Other Ambulatory Visit: Payer: Self-pay | Admitting: Family Medicine

## 2014-09-26 ENCOUNTER — Telehealth: Payer: Self-pay

## 2014-09-26 NOTE — Telephone Encounter (Signed)
Lft vm 

## 2014-09-26 NOTE — Telephone Encounter (Signed)
-----   Message from Alba CoryKrichna Sowles, MD sent at 09/25/2014  1:57 PM EDT ----- Sugar , kidney and liver functions are within normal limits, except for alkaline phosphatase that is slightly elevated and likely from her medications - not to worry She has mild anemia, that is not iron deficiency - likely anemia of chronic disease. Vitamin D is very low and I will send prescription vitamin D [facility] and LH are elevated and consistent with post-menopausal- she does not need to go to GYN to have another IUD placed, please cancer referral and explain to patient

## 2014-09-29 LAB — PAP LB (LIQUID-BASED): PAP SMEAR COMMENT: 0

## 2014-09-29 NOTE — Progress Notes (Signed)
Patient notified

## 2014-11-02 ENCOUNTER — Encounter: Payer: Self-pay | Admitting: Family Medicine

## 2014-11-03 ENCOUNTER — Ambulatory Visit: Payer: Self-pay

## 2014-11-03 ENCOUNTER — Ambulatory Visit: Payer: Self-pay | Admitting: Oncology

## 2014-11-07 ENCOUNTER — Inpatient Hospital Stay: Payer: 59

## 2014-11-07 ENCOUNTER — Inpatient Hospital Stay: Payer: 59 | Attending: Oncology | Admitting: Oncology

## 2014-11-07 VITALS — BP 127/79 | HR 69 | Temp 96.1°F | Resp 18 | Wt 297.6 lb

## 2014-11-07 DIAGNOSIS — Z803 Family history of malignant neoplasm of breast: Secondary | ICD-10-CM | POA: Diagnosis not present

## 2014-11-07 DIAGNOSIS — Z8669 Personal history of other diseases of the nervous system and sense organs: Secondary | ICD-10-CM | POA: Insufficient documentation

## 2014-11-07 DIAGNOSIS — D72819 Decreased white blood cell count, unspecified: Secondary | ICD-10-CM

## 2014-11-07 DIAGNOSIS — I10 Essential (primary) hypertension: Secondary | ICD-10-CM | POA: Diagnosis not present

## 2014-11-07 DIAGNOSIS — Z79899 Other long term (current) drug therapy: Secondary | ICD-10-CM | POA: Diagnosis not present

## 2014-11-07 DIAGNOSIS — G4730 Sleep apnea, unspecified: Secondary | ICD-10-CM | POA: Diagnosis not present

## 2014-11-07 DIAGNOSIS — D509 Iron deficiency anemia, unspecified: Secondary | ICD-10-CM | POA: Insufficient documentation

## 2014-11-07 NOTE — Progress Notes (Signed)
Patient's concern is feeling fatigued.

## 2014-11-11 NOTE — Progress Notes (Signed)
[facility]  [phone] [phone]  ID: Jacqualin Combes OB: [birth year]  MR#: [id number]  AOZ#:[id number]  Patient Care Team: Alba Cory, MD as PCP - General (Family Medicine)  CHIEF COMPLAINT:  Chief Complaint  Patient presents with  . Follow-up    IDA    INTERVAL HISTORY: Patient returns to clinic today for repeat laboratory work and clinical evaluation.  She currently feels well and is asymptomatic. She does not complain of weakness fatigue today. She does not complain of being persistently cold. She continues to be active and work full-time. She denies any weight loss or weight gain.  She denies any current chest pain or shortness of breath.  She offers no specific complaints today.  REVIEW OF SYSTEMS:   Review of Systems  Constitutional: Negative.   Respiratory: Negative.   Cardiovascular: Negative.   Gastrointestinal: Negative.   Musculoskeletal: Negative.   Neurological: Negative.     As per HPI. Otherwise, a complete review of systems is negatve.  PAST MEDICAL HISTORY: Anemia, hypertension, seizure disorder, sleep apnea, hypertension, left breast lumpectomy with Dr. Lemar Livings. Colonoscopy in 2004.  PAST SURGICAL HISTORY: No past surgical history on file.  FAMILY HISTORY Family History  Problem Relation Age of Onset  . Cancer Mother     Breast  . Hypertension Mother   . Diabetes Mother   . Hyperlipidemia Mother        ADVANCED DIRECTIVES:    HEALTH MAINTENANCE: Social History  Substance Use Topics  . Smoking status: Never Smoker   . Smokeless tobacco: Not on file  . Alcohol Use: No     Colonoscopy:  PAP:  Bone density:  Lipid panel:  Allergies  Allergen Reactions  . Ace Inhibitors     Current Outpatient Prescriptions  Medication Sig Dispense Refill  . albuterol (PROVENTIL HFA;VENTOLIN HFA) 108 (90 BASE) MCG/ACT inhaler Inhale 2 puffs into the lungs every 6 (six) hours as needed.    Marland Kitchen amLODipine  (NORVASC) 2.5 MG tablet Take 1 tablet (2.5 mg total) by mouth daily. 90 tablet 3  . budesonide-formoterol (SYMBICORT) 80-4.5 MCG/ACT inhaler Inhale 2 puffs into the lungs as needed.    . carbamazepine (TEGRETOL) 200 MG tablet TEGRETOL, 200MG  (Oral Tablet)  one a half m and afternoon and 2 and half qhs for 0 days  Quantity: 1.00;  Refills: 0   Ordered :26-Jan-2010  Alba Cory MD;  Mora Appl 14-Mar-2008 Active    . fluticasone (FLONASE) 50 MCG/ACT nasal spray Place 1 spray into both nostrils daily.    . hydrOXYzine (ATARAX/VISTARIL) 25 MG tablet Take 1 tablet by mouth every evening.    . levETIRAcetam (KEPPRA) 500 MG tablet Take 3 tablets by mouth 2 (two) times daily.    Marland Kitchen loratadine (CLARITIN) 10 MG tablet Take 1 tablet by mouth 2 (two) times daily as needed.    Marland Kitchen losartan-hydrochlorothiazide (HYZAAR) 100-12.5 MG per tablet Take 1 tablet by mouth daily. 90 tablet 3  . topiramate (TOPAMAX) 100 MG tablet Take 3 tablets by mouth 3 (three) times daily.    . Vitamin D, Ergocalciferol, (DRISDOL) 50000 UNITS CAPS capsule Take 1 capsule (50,000 Units total) by mouth every 7 (seven) days. 12 capsule 1   No current facility-administered medications for this visit.    OBJECTIVE: Filed Vitals:   11/07/14 1058  BP: 127/79  Pulse: 69  Temp: 96.1 F (35.6 C)  Resp: 18     Body mass index is 54.42 kg/(m^2).    ECOG FS:0 - Asymptomatic  General: Well-developed, well-nourished, no acute distress. Eyes: Pink conjunctiva, anicteric sclera. Lungs: Clear to auscultation bilaterally. Heart: Regular rate and rhythm. No rubs, murmurs, or gallops. Abdomen: Soft, nontender, nondistended. No organomegaly noted, normoactive bowel sounds. Musculoskeletal: No edema, cyanosis, or clubbing. Neuro: Alert, answering all questions appropriately. Cranial nerves grossly intact. Skin: No rashes or petechiae noted. Psych: Normal affect.   LAB RESULTS:  Lab Results  Component Value Date   NA 144 09/23/2014   K 4.5  09/23/2014   CL 104 09/23/2014   CO2 23 09/23/2014   GLUCOSE 92 09/23/2014   BUN 13 09/23/2014   CREATININE 0.98 09/23/2014   CALCIUM 10.0 09/23/2014   PROT 7.0 09/23/2014   ALBUMIN 3.5 05/14/2011   AST 14 09/23/2014   ALT 11 09/23/2014   ALKPHOS 129* 09/23/2014   BILITOT 0.2 09/23/2014   GFRNONAA 67 09/23/2014   GFRAA 77 09/23/2014    Lab Results  Component Value Date   WBC 4.1 09/23/2014   NEUTROABS 2.4 09/23/2014   HGB 11.5* 04/09/2013   HCT 33.1* 09/23/2014   MCV 89 04/09/2013   PLT 204 04/09/2013     STUDIES: No results found.  ASSESSMENT: Iron deficiency anemia.  PLAN:    1. Anemia: Patient receives all of her lab work at [facility]. Patient's hemoglobin is 10.6. Serum iron 42, iron saturation 21%, ferritin 93. No intervention is needed today. He last received IV iron in May of 2015.  Her iron stores have slightly declined, therefore she may need IV Feraheme at her next clinic visit.Return to clinic in 6 months with repeat laboratory work and further evaluation.  Patient expressed understanding and was in agreement with this plan. 2.  Leukopenia: Patient's white blood cell count is within normal limits at 4.1 today. Most likely secondary to medications. No intervention is needed.  Patient expressed understanding and was in agreement with this plan. She also understands that She can call clinic at any time with any questions, concerns, or complaints.    Jeralyn Ruths, MD   11/11/2014 12:49 PM

## 2014-11-15 ENCOUNTER — Encounter: Payer: 59 | Admitting: Obstetrics and Gynecology

## 2014-12-26 ENCOUNTER — Ambulatory Visit: Payer: 59 | Admitting: Family Medicine

## 2015-01-31 ENCOUNTER — Encounter: Payer: Self-pay | Admitting: Oncology

## 2015-02-07 ENCOUNTER — Inpatient Hospital Stay: Payer: 59 | Attending: Oncology | Admitting: Oncology

## 2015-02-07 DIAGNOSIS — I10 Essential (primary) hypertension: Secondary | ICD-10-CM | POA: Diagnosis not present

## 2015-02-07 DIAGNOSIS — D509 Iron deficiency anemia, unspecified: Secondary | ICD-10-CM | POA: Diagnosis present

## 2015-02-07 DIAGNOSIS — G4730 Sleep apnea, unspecified: Secondary | ICD-10-CM | POA: Diagnosis not present

## 2015-02-07 DIAGNOSIS — G40909 Epilepsy, unspecified, not intractable, without status epilepticus: Secondary | ICD-10-CM

## 2015-02-07 DIAGNOSIS — Z803 Family history of malignant neoplasm of breast: Secondary | ICD-10-CM | POA: Diagnosis not present

## 2015-02-07 DIAGNOSIS — Z862 Personal history of diseases of the blood and blood-forming organs and certain disorders involving the immune mechanism: Secondary | ICD-10-CM | POA: Insufficient documentation

## 2015-02-07 DIAGNOSIS — Z79899 Other long term (current) drug therapy: Secondary | ICD-10-CM | POA: Insufficient documentation

## 2015-02-12 NOTE — Progress Notes (Signed)
[facility]  Telephone:(336) [phone] Fax:(336) [phone]  ID: Paula [name] OB: [birth year]  MR#: [id number]  AOZ#:[id number]  Patient Care Team: Alba Cory, MD as PCP - General (Family Medicine)  CHIEF COMPLAINT:  No chief complaint on file.   INTERVAL HISTORY: Patient returns to clinic today for repeat laboratory work and clinical evaluation.  She currently feels well and is asymptomatic. She does not complain of weakness fatigue today. She continues to complain of being persistently cold. She continues to be active and work full-time. She denies any weight loss or weight gain.  She denies any current chest pain or shortness of breath.  She offers no specific complaints today.  REVIEW OF SYSTEMS:   Review of Systems  Constitutional: Negative.   Respiratory: Negative.   Cardiovascular: Negative.   Gastrointestinal: Negative.   Musculoskeletal: Negative.   Neurological: Negative.     As per HPI. Otherwise, a complete review of systems is negatve.  PAST MEDICAL HISTORY: Anemia, hypertension, seizure disorder, sleep apnea, hypertension, left breast lumpectomy with Dr. Lemar Livings. Colonoscopy in 2004.  PAST SURGICAL HISTORY: No past surgical history on file.  FAMILY HISTORY Family History  Problem Relation Age of Onset  . Cancer Mother     Breast  . Hypertension Mother   . Diabetes Mother   . Hyperlipidemia Mother        ADVANCED DIRECTIVES:    HEALTH MAINTENANCE: Social History  Substance Use Topics  . Smoking status: Never Smoker   . Smokeless tobacco: Not on file  . Alcohol Use: No     Colonoscopy:  PAP:  Bone density:  Lipid panel:  Allergies  Allergen Reactions  . Ace Inhibitors     Current Outpatient Prescriptions  Medication Sig Dispense Refill  . albuterol (PROVENTIL HFA;VENTOLIN HFA) 108 (90 BASE) MCG/ACT inhaler Inhale 2 puffs into the lungs every 6 (six) hours as needed.    Marland Kitchen amLODipine (NORVASC) 2.5 MG tablet Take 1  tablet (2.5 mg total) by mouth daily. 90 tablet 3  . budesonide-formoterol (SYMBICORT) 80-4.5 MCG/ACT inhaler Inhale 2 puffs into the lungs as needed.    . carbamazepine (TEGRETOL) 200 MG tablet TEGRETOL,  (Oral Tablet)  one a half m and afternoon and 2 and half qhs for 0 days  Quantity: 1.00;  Refills: 0   Ordered :26-Jan-2010  Alba Cory MD;  Mora Appl 14-Mar-2008 Active    . fluticasone (FLONASE) 50 MCG/ACT nasal spray Place 1 spray into both nostrils daily.    . hydrOXYzine (ATARAX/VISTARIL) 25 MG tablet Take 1 tablet by mouth every evening.    . levETIRAcetam (KEPPRA) 500 MG tablet Take 3 tablets by mouth 2 (two) times daily.    Marland Kitchen loratadine (CLARITIN) 10 MG tablet Take 1 tablet by mouth 2 (two) times daily as needed.    Marland Kitchen losartan-hydrochlorothiazide (HYZAAR) 100-12.5 MG per tablet Take 1 tablet by mouth daily. 90 tablet 3  . topiramate (TOPAMAX) 100 MG tablet Take 3 tablets by mouth 3 (three) times daily.    . Vitamin D, Ergocalciferol, (DRISDOL) 50000 UNITS CAPS capsule Take 1 capsule (50,000 Units total) by mouth every 7 (seven) days. 12 capsule 1   No current facility-administered medications for this visit.    OBJECTIVE: There were no vitals filed for this visit.   There is no weight on file to calculate BMI.    ECOG FS:0 - Asymptomatic  General: Well-developed, well-nourished, no acute distress. Eyes: Pink conjunctiva, anicteric sclera. Lungs: Clear to auscultation bilaterally. Heart: Regular rate and  rhythm. No rubs, murmurs, or gallops. Abdomen: Soft, nontender, nondistended. No organomegaly noted, normoactive bowel sounds. Musculoskeletal: No edema, cyanosis, or clubbing. Neuro: Alert, answering all questions appropriately. Cranial nerves grossly intact. Skin: No rashes or petechiae noted. Psych: Normal affect.   LAB RESULTS:  Lab Results  Component Value Date   NA 144 09/23/2014   K 4.5 09/23/2014   CL 104 09/23/2014   CO2 23 09/23/2014   GLUCOSE 92  09/23/2014   BUN 13 09/23/2014   CREATININE 0.98 09/23/2014   CALCIUM 10.0 09/23/2014   PROT 7.0 09/23/2014   ALBUMIN 4.5 09/23/2014   AST 14 09/23/2014   ALT 11 09/23/2014   ALKPHOS 129* 09/23/2014   BILITOT 0.2 09/23/2014   GFRNONAA 67 09/23/2014   GFRAA 77 09/23/2014    Lab Results  Component Value Date   WBC 4.1 09/23/2014   NEUTROABS 2.4 09/23/2014   HGB 11.5* 04/09/2013   HCT 33.1* 09/23/2014   MCV 89 04/09/2013   PLT 204 04/09/2013     STUDIES: No results found.  ASSESSMENT: Iron deficiency anemia.  PLAN:    1. Anemia: Patient receives all of her lab work at [facility]. Patient's hemoglobin is decreased but essentially unchanged. Her iron stores are within normal limits. No intervention is needed today. He last received IV iron in May of 2015. Return to clinic in 6 months with repeat laboratory work and further evaluation.  Have previously recommended that patient be discharged from clinic and continued monitoring from her primary care physician, but she refuses and wishes to follow-up every 6 months as planned.  2.  History of leukopenia: Patient's white blood cell count is within normal limits. Most likely secondary to medications. No intervention is needed.  Patient expressed understanding and was in agreement with this plan. She also understands that She can call clinic at any time with any questions, concerns, or complaints.    Jeralyn Ruthsimothy J Adaia Matthies, MD   02/12/2015 11:05 AM

## 2015-05-08 ENCOUNTER — Ambulatory Visit: Payer: 59 | Admitting: Oncology

## 2015-05-08 ENCOUNTER — Ambulatory Visit: Payer: 59

## 2015-05-09 ENCOUNTER — Ambulatory Visit: Payer: 59 | Admitting: Oncology

## 2015-05-09 ENCOUNTER — Ambulatory Visit: Payer: 59

## 2015-06-05 ENCOUNTER — Other Ambulatory Visit: Payer: Self-pay | Admitting: Family Medicine

## 2015-06-05 NOTE — Telephone Encounter (Signed)
Patient requesting refill. 

## 2015-08-07 ENCOUNTER — Ambulatory Visit: Payer: 59

## 2015-08-07 ENCOUNTER — Ambulatory Visit: Payer: 59 | Admitting: Oncology

## 2015-08-14 ENCOUNTER — Inpatient Hospital Stay: Payer: 59 | Attending: Oncology | Admitting: Oncology

## 2015-08-14 ENCOUNTER — Inpatient Hospital Stay: Payer: 59

## 2015-08-14 VITALS — BP 155/90 | HR 71 | Temp 98.7°F | Resp 18 | Wt 306.4 lb

## 2015-08-14 DIAGNOSIS — D509 Iron deficiency anemia, unspecified: Secondary | ICD-10-CM | POA: Insufficient documentation

## 2015-08-14 DIAGNOSIS — G4730 Sleep apnea, unspecified: Secondary | ICD-10-CM

## 2015-08-14 DIAGNOSIS — I10 Essential (primary) hypertension: Secondary | ICD-10-CM | POA: Insufficient documentation

## 2015-08-14 DIAGNOSIS — Z79899 Other long term (current) drug therapy: Secondary | ICD-10-CM | POA: Insufficient documentation

## 2015-08-14 DIAGNOSIS — J069 Acute upper respiratory infection, unspecified: Secondary | ICD-10-CM

## 2015-08-14 DIAGNOSIS — G40909 Epilepsy, unspecified, not intractable, without status epilepticus: Secondary | ICD-10-CM | POA: Diagnosis not present

## 2015-08-14 DIAGNOSIS — R5383 Other fatigue: Secondary | ICD-10-CM

## 2015-08-14 DIAGNOSIS — Z809 Family history of malignant neoplasm, unspecified: Secondary | ICD-10-CM

## 2015-08-14 DIAGNOSIS — R05 Cough: Secondary | ICD-10-CM | POA: Diagnosis not present

## 2015-08-14 LAB — CBC WITH DIFFERENTIAL/PLATELET
BASOS PCT: 1 %
Basophils Absolute: 0 10*3/uL (ref 0–0.1)
EOS ABS: 0.2 10*3/uL (ref 0–0.7)
Eosinophils Relative: 5 %
HEMATOCRIT: 30.9 % — AB (ref 35.0–47.0)
HEMOGLOBIN: 10.1 g/dL — AB (ref 12.0–16.0)
LYMPHS ABS: 1.2 10*3/uL (ref 1.0–3.6)
Lymphocytes Relative: 32 %
MCH: 27 pg (ref 26.0–34.0)
MCHC: 32.8 g/dL (ref 32.0–36.0)
MCV: 82.3 fL (ref 80.0–100.0)
MONOS PCT: 9 %
Monocytes Absolute: 0.3 10*3/uL (ref 0.2–0.9)
NEUTROS ABS: 2 10*3/uL (ref 1.4–6.5)
NEUTROS PCT: 53 %
Platelets: 220 10*3/uL (ref 150–440)
RBC: 3.75 MIL/uL — AB (ref 3.80–5.20)
RDW: 15.1 % — ABNORMAL HIGH (ref 11.5–14.5)
WBC: 3.8 10*3/uL (ref 3.6–11.0)

## 2015-08-14 LAB — IRON AND TIBC
IRON: 25 ug/dL — AB (ref 28–170)
SATURATION RATIOS: 10 % — AB (ref 10.4–31.8)
TIBC: 249 ug/dL — AB (ref 250–450)
UIBC: 224 ug/dL

## 2015-08-14 LAB — FERRITIN: FERRITIN: 43 ng/mL (ref 11–307)

## 2015-08-14 NOTE — Progress Notes (Signed)
Patient is feeling an increase in fatigue.  She has a cough for the past 3 weeks and is requesting rx for antibiotic.

## 2015-08-14 NOTE — Progress Notes (Signed)
[facility]  Telephone:(336) [phone] Fax:(336) [phone]  ID: Jacqualin Combes OB: [birth year]  MR#: [id number]  QIO#:[id number]  Patient Care Team: Alba Cory, MD as PCP - General (Family Medicine)  CHIEF COMPLAINT:  Chief Complaint  Patient presents with  . Anemia    INTERVAL HISTORY: Patient returns to clinic today for repeat laboratory work and clinical evaluation. She is feeling increased fatigue today but has been fighting a cold for about 3 weeks. She is coughing up clear sputum and is requesting an antibiotic. Otherwise, she feels well. She continues to be active and work full-time. She denies fever or chills. She denies any weight loss or weight gain.  She denies any current chest pain or shortness of breath.  She offers no specific complaints today.  REVIEW OF SYSTEMS:   Review of Systems  Constitutional: Positive for malaise/fatigue.  Respiratory: Positive for cough.   Cardiovascular: Negative.   Gastrointestinal: Negative.   Musculoskeletal: Negative.   Neurological: Positive for weakness.    As per HPI. Otherwise, a complete review of systems is negatve.  PAST MEDICAL HISTORY: Anemia, hypertension, seizure disorder, sleep apnea, hypertension, left breast lumpectomy with Dr. Lemar Livings. Colonoscopy in 2004.  PAST SURGICAL HISTORY: No past surgical history on file.  FAMILY HISTORY Family History  Problem Relation Age of Onset  . Cancer Mother     Breast  . Hypertension Mother   . Diabetes Mother   . Hyperlipidemia Mother        ADVANCED DIRECTIVES:    HEALTH MAINTENANCE: Social History  Substance Use Topics  . Smoking status: Never Smoker   . Smokeless tobacco: Not on file  . Alcohol Use: No     Allergies  Allergen Reactions  . Ace Inhibitors     Current Outpatient Prescriptions  Medication Sig Dispense Refill  . albuterol (PROVENTIL HFA;VENTOLIN HFA) 108 (90 BASE) MCG/ACT inhaler Inhale 2 puffs into the lungs every 6 (six)  hours as needed.    Marland Kitchen amLODipine (NORVASC) 2.5 MG tablet Take 1 tablet (2.5 mg total) by mouth daily. 90 tablet 3  . budesonide-formoterol (SYMBICORT) 80-4.5 MCG/ACT inhaler Inhale 2 puffs into the lungs as needed.    . carbamazepine (TEGRETOL) 200 MG tablet TEGRETOL,  (Oral Tablet)  one a half m and afternoon and 2 and half qhs for 0 days  Quantity: 1.00;  Refills: 0   Ordered :26-Jan-2010  Alba Cory MD;  Mora Appl 14-Mar-2008 Active    . fluticasone (FLONASE) 50 MCG/ACT nasal spray Place 1 spray into both nostrils daily.    . hydrOXYzine (ATARAX/VISTARIL) 25 MG tablet Take 1 tablet by mouth every evening.    . levETIRAcetam (KEPPRA) 500 MG tablet Take 3 tablets by mouth 2 (two) times daily.    Marland Kitchen loratadine (CLARITIN) 10 MG tablet Take 1 tablet by mouth 2 (two) times daily as needed.    Marland Kitchen losartan-hydrochlorothiazide (HYZAAR) 100-12.5 MG per tablet Take 1 tablet by mouth daily. 90 tablet 3  . topiramate (TOPAMAX) 100 MG tablet Take 3 tablets by mouth 3 (three) times daily.    . Vitamin D, Ergocalciferol, (DRISDOL) 50000 units CAPS capsule Take 1 capsule by mouth  every 7 days 12 capsule 0   No current facility-administered medications for this visit.    OBJECTIVE: Filed Vitals:   08/14/15 1444  BP: 155/90  Pulse: 71  Temp: 98.7 F (37.1 C)  Resp: 18     Body mass index is 56.03 kg/(m^2).    ECOG FS:0 - Asymptomatic  General: Well-developed, well-nourished, no acute distress. Eyes: Pink conjunctiva, anicteric sclera. Lungs: Clear to auscultation bilaterally. Heart: Regular rate and rhythm. No rubs, murmurs, or gallops. Abdomen: Soft, nontender, nondistended. No organomegaly noted, normoactive bowel sounds. Musculoskeletal: No edema, cyanosis, or clubbing. Neuro: Alert, answering all questions appropriately. Cranial nerves grossly intact. Skin: No rashes or petechiae noted. Psych: Normal affect.   LAB RESULTS:  Lab Results  Component Value Date   NA 144 09/23/2014   K  4.5 09/23/2014   CL 104 09/23/2014   CO2 23 09/23/2014   GLUCOSE 92 09/23/2014   BUN 13 09/23/2014   CREATININE 0.98 09/23/2014   CALCIUM 10.0 09/23/2014   PROT 7.0 09/23/2014   ALBUMIN 4.5 09/23/2014   AST 14 09/23/2014   ALT 11 09/23/2014   ALKPHOS 129* 09/23/2014   BILITOT 0.2 09/23/2014   GFRNONAA 67 09/23/2014   GFRAA 77 09/23/2014    Lab Results  Component Value Date   WBC 3.8 08/14/2015   NEUTROABS 2.0 08/14/2015   HGB 10.1* 08/14/2015   HCT 30.9* 08/14/2015   MCV 82.3 08/14/2015   PLT 220 08/14/2015     STUDIES: No results found.  ASSESSMENT: Iron deficiency anemia.  PLAN:    1. Anemia: Patient did not have time to go to [facility] for her labs so she did get them here today. She will go to [facility] in the future for all her labs. Her HGB is decreased at 10.1 today. Her iron stores are mildly low today. Her last IV iron was in May 2015. Patient will return to clinic for 2 doses of 510 mg IV feraheme separated by one week.  Then she will return to clinic in 6 months with repeat laboratory work and further evaluation.  Have previously recommended that patient be discharged from clinic and continued monitoring from her primary care physician, but she refuses and wishes to follow-up every 6 months as planned.  2. Cough: Offered chest xray but patient declined at this time.    Patient expressed understanding and was in agreement with this plan. She also understands that She can call clinic at any time with any questions, concerns, or complaints.    Genevie Cheshirericia Taysen Bushart, NP   08/14/2015 3:26 PM

## 2015-08-21 ENCOUNTER — Other Ambulatory Visit: Payer: Self-pay | Admitting: Oncology

## 2015-08-21 ENCOUNTER — Inpatient Hospital Stay: Payer: 59

## 2015-08-21 VITALS — BP 117/79 | HR 72 | Temp 99.4°F | Resp 20

## 2015-08-21 DIAGNOSIS — D509 Iron deficiency anemia, unspecified: Secondary | ICD-10-CM

## 2015-08-21 MED ORDER — SODIUM CHLORIDE 0.9 % IV SOLN
510.0000 mg | Freq: Once | INTRAVENOUS | Status: AC
  Administered 2015-08-21: 510 mg via INTRAVENOUS
  Filled 2015-08-21: qty 17

## 2015-08-21 MED ORDER — SODIUM CHLORIDE 0.9 % IV SOLN
Freq: Once | INTRAVENOUS | Status: AC
  Administered 2015-08-21: 11:00:00 via INTRAVENOUS
  Filled 2015-08-21: qty 1000

## 2015-08-22 NOTE — Progress Notes (Signed)
08/14/2015 visit - Dr. Finnegan was available for consultation and review of plan of care for this patient 

## 2015-08-28 ENCOUNTER — Ambulatory Visit: Payer: 59

## 2015-09-01 ENCOUNTER — Ambulatory Visit (INDEPENDENT_AMBULATORY_CARE_PROVIDER_SITE_OTHER): Payer: 59

## 2015-09-01 ENCOUNTER — Ambulatory Visit (INDEPENDENT_AMBULATORY_CARE_PROVIDER_SITE_OTHER): Payer: 59 | Admitting: Sports Medicine

## 2015-09-01 ENCOUNTER — Encounter: Payer: Self-pay | Admitting: Sports Medicine

## 2015-09-01 DIAGNOSIS — M25571 Pain in right ankle and joints of right foot: Secondary | ICD-10-CM

## 2015-09-01 DIAGNOSIS — G5751 Tarsal tunnel syndrome, right lower limb: Secondary | ICD-10-CM

## 2015-09-01 DIAGNOSIS — M19071 Primary osteoarthritis, right ankle and foot: Secondary | ICD-10-CM | POA: Diagnosis not present

## 2015-09-01 DIAGNOSIS — M779 Enthesopathy, unspecified: Secondary | ICD-10-CM | POA: Diagnosis not present

## 2015-09-01 DIAGNOSIS — M79671 Pain in right foot: Secondary | ICD-10-CM | POA: Diagnosis not present

## 2015-09-01 MED ORDER — DICLOFENAC SODIUM 75 MG PO TBEC: 75.0000 mg | DELAYED_RELEASE_TABLET | Freq: Two times a day (BID) | ORAL | Status: DC

## 2015-09-01 MED ORDER — TRIAMCINOLONE ACETONIDE 10 MG/ML IJ SUSP: 10.0000 mg | Freq: Once | INTRAMUSCULAR | Status: DC

## 2015-09-01 MED ORDER — METHYLPREDNISOLONE 4 MG PO TBPK: ORAL_TABLET | ORAL | Status: DC

## 2015-09-01 NOTE — Progress Notes (Signed)
Patient ID: Paula [name], female   DOB: 10/29/1960, 55 y.o.   MRN: [id number] Subjective: Paula [name] is a 55 y.o. female patient who presents to office for evaluation of right ankle pain. Patient complains of continued pain in the ankle 3 weeks. Patient cannot describe pain. States that it just hurts. Patient has tried wearing her tennis shoes and orthotics with no relief in symptoms. Patient denies any other pedal complaints. Denies injury/trip/fall/sprain/any causative factors.   Patient Active Problem List   Diagnosis Date Noted  . 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 Asthma, mild intermittent 08/15/2014  . Benign hypertension 08/15/2014  . Herpes genitalis in women 08/15/2014  . Allergic rhinitis 08/15/2014  . Cerebral seizure (HCC) 08/15/2014  . Vitamin D deficiency 08/15/2014  . Extreme obesity (HCC) 08/03/2014  . Morbid obesity (HCC) 08/03/2014  . Adhesive capsulitis of left shoulder 06/16/2013  . Obstructive sleep apnea of adult 06/16/2013  . Epilepsy (HCC) 06/16/2013  . Intractable epilepsy (HCC) 06/16/2013  . Anemia, iron deficiency 09/30/2006    Current Outpatient Prescriptions on File Prior to Visit  Medication Sig Dispense Refill  . albuterol (PROVENTIL HFA;VENTOLIN HFA) 108 (90 BASE) MCG/ACT inhaler Inhale 2 puffs into the lungs every 6 (six) hours as needed.    Marland Kitchen. amLODipine (NORVASC) 2.5 MG tablet Take 1 tablet (2.5 mg total) by mouth daily. 90 tablet 3  . budesonide-formoterol (SYMBICORT) 80-4.5 MCG/ACT inhaler Inhale 2 puffs into the lungs as needed.    . carbamazepine (TEGRETOL) 200 MG tablet TEGRETOL, 200MG  (Oral Tablet)  one a half m and afternoon and 2 and half qhs for 0 days  Quantity: 1.00;  Refills: 0   Ordered :26-Jan-2010  Alba CorySOWLES, KRICHNA MD;  Mora ApplStarted 14-Mar-2008 Active    . fluticasone (FLONASE) 50 MCG/ACT nasal spray Place 1 spray into both nostrils daily.    . hydrOXYzine (ATARAX/VISTARIL) 25 MG tablet  Take 1 tablet by mouth every evening.    . levETIRAcetam (KEPPRA) 500 MG tablet Take 3 tablets by mouth 2 (two) times daily.    Marland Kitchen. loratadine (CLARITIN) 10 MG tablet Take 1 tablet by mouth 2 (two) times daily as needed.    Marland Kitchen. losartan-hydrochlorothiazide (HYZAAR) 100-12.5 MG per tablet Take 1 tablet by mouth daily. 90 tablet 3  . topiramate (TOPAMAX) 100 MG tablet Take 3 tablets by mouth 3 (three) times daily.    . Vitamin D, Ergocalciferol, (DRISDOL) 50000 units CAPS capsule Take 1 capsule by mouth  every 7 days 12 capsule 0   No current facility-administered medications on file prior to visit.    Allergies  Allergen Reactions  . Ace Inhibitors     Objective:  General: Alert and oriented x3 in no acute distress  Dermatology: No open lesions bilateral lower extremities, no webspace macerations, no ecchymosis bilateral, all nails x 10 are well manicured.  Vascular: Dorsalis Pedis and Posterior Tibial pedal pulses palpable, Capillary Fill Time 3 seconds,(+) pedal hair growth bilateral, Trace edema bilateral lower extremities, right ankle greater than left, Temperature gradient within normal limits.  Neurology: Michaell CowingGross sensation intact via light touch bilateral, Protective sensation intact  with Phoebe PerchSemmes Weinstein Monofilament to all pedal sites, Position sense intact, vibratory intact bilateral, Deep tendon reflexes within normal limits bilateral, No babinski sign present bilateral. (- )Tinels sign bilateral.   Musculoskeletal: Mild tenderness with palpation at Right ankle at extensor tendon complex with most tender point at sinus tarsi. Negative talar tilt, Negative tib-fib stress, No instability. No  pain with calf compression bilateral. Range of motion within normal limits with mild guarding on right ankle. Strength within normal limits in all groups bilateral. Mild hammertoe and pes planus.  Xrays  Right ankle and Foot   Impression: Normal osseous mineralization. There is significant ankle  and midfoot spurring suggestive of arthritis. There is significant calcaneal spurring and mild changes to toes suggestive of hammertoe deformity. Midtarsal breach, suggestive of planus foot type. No acute fracture or dislocation. No foreign body, soft tissues within normal limits.  Assessment and Plan: Problem List Items Addressed This Visit    None    Visit Diagnoses    Right foot pain    -  Primary    Relevant Medications    triamcinolone acetonide (KENALOG) 10 MG/ML injection 10 mg (Start on 09/01/2015  9:45 AM)    methylPREDNISolone (MEDROL DOSEPAK) 4 MG TBPK tablet    diclofenac (VOLTAREN) 75 MG EC tablet    Other Relevant Orders    DG Foot 2 Views Right    DG Ankle 2 Views Right    Ankle pain, right        Relevant Medications    triamcinolone acetonide (KENALOG) 10 MG/ML injection 10 mg (Start on 09/01/2015  9:45 AM)    methylPREDNISolone (MEDROL DOSEPAK) 4 MG TBPK tablet    diclofenac (VOLTAREN) 75 MG EC tablet    Sinus tarsi syndrome of right ankle        Relevant Medications    triamcinolone acetonide (KENALOG) 10 MG/ML injection 10 mg (Start on 09/01/2015  9:45 AM)    methylPREDNISolone (MEDROL DOSEPAK) 4 MG TBPK tablet    diclofenac (VOLTAREN) 75 MG EC tablet    Tendonitis        Relevant Medications    triamcinolone acetonide (KENALOG) 10 MG/ML injection 10 mg (Start on 09/01/2015  9:45 AM)    methylPREDNISolone (MEDROL DOSEPAK) 4 MG TBPK tablet    diclofenac (VOLTAREN) 75 MG EC tablet    Osteoarthritis of ankle and foot, right        Relevant Medications    triamcinolone acetonide (KENALOG) 10 MG/ML injection 10 mg (Start on 09/01/2015  9:45 AM)    methylPREDNISolone (MEDROL DOSEPAK) 4 MG TBPK tablet    diclofenac (VOLTAREN) 75 MG EC tablet        -Complete examination performed -Xrays reviewed -Discussed treatement options for likely tendinitis with sinus tarsi syndrome and arthritis secondary to foot type -After oral consent and aseptic prep, injected a mixture  containing 1 ml of 2%  plain lidocaine, 1 ml 0.5% plain marcaine, 0.5 ml of kenalog 10 and 0.5 ml of dexamethasone phosphate into right sinus tarsi without complication. Post-injection care discussed with patient.  -Dispensed Tri-Lock ankle brace for right and instructed patient on use -Rx Medrol dose pack and Voltaren to start once completed -Recommend, rest, ice, elevation daily until symptoms improve -Patient to return to office 4 weeks or sooner if condition worsens. Will evaluate patient's shoes and orthotics at next visit, if her symptoms are better.  Asencion Islamitorya Nohelia Valenza, DPM

## 2015-09-04 ENCOUNTER — Inpatient Hospital Stay: Payer: 59

## 2015-09-04 VITALS — BP 119/74 | HR 68 | Temp 98.4°F | Resp 18

## 2015-09-04 DIAGNOSIS — D509 Iron deficiency anemia, unspecified: Secondary | ICD-10-CM

## 2015-09-04 MED ORDER — SODIUM CHLORIDE 0.9 % IV SOLN
Freq: Once | INTRAVENOUS | Status: AC
Start: 2015-09-04 — End: 2015-09-04
  Administered 2015-09-04: 10:00:00 via INTRAVENOUS
  Filled 2015-09-04: qty 1000

## 2015-09-04 MED ORDER — SODIUM CHLORIDE 0.9 % IV SOLN
510.0000 mg | Freq: Once | INTRAVENOUS | Status: AC
  Administered 2015-09-04: 510 mg via INTRAVENOUS
  Filled 2015-09-04: qty 17

## 2015-09-08 ENCOUNTER — Other Ambulatory Visit: Payer: Self-pay | Admitting: Family Medicine

## 2015-09-08 MED ORDER — AMLODIPINE BESYLATE 2.5 MG PO TABS: 2.5000 mg | ORAL_TABLET | Freq: Every day | ORAL | Status: DC

## 2015-09-08 MED ORDER — LOSARTAN POTASSIUM-HCTZ 100-12.5 MG PO TABS: 1.0000 | ORAL_TABLET | Freq: Every day | ORAL | Status: DC

## 2015-09-08 NOTE — Telephone Encounter (Signed)
PT HAS MADE APPT ON July 5 BUT IS NEEDING HER BLOOD PRESSURE. PHARM IS [facility] FOR RIGHT NOW FOR 30 DAY SUPPLY BUT THEN  NEEDS 90 DAY SUPPLY TO GO TO [facility] ORDER

## 2015-09-08 NOTE — Telephone Encounter (Signed)
Left message with her mom. Patient has appointment for next week.

## 2015-09-13 ENCOUNTER — Encounter: Payer: Self-pay | Admitting: Family Medicine

## 2015-09-13 ENCOUNTER — Ambulatory Visit (INDEPENDENT_AMBULATORY_CARE_PROVIDER_SITE_OTHER): Payer: 59 | Admitting: Family Medicine

## 2015-09-13 VITALS — HR 84 | Temp 98.7°F | Resp 16 | Wt 307.1 lb

## 2015-09-13 DIAGNOSIS — J4520 Mild intermittent asthma, uncomplicated: Secondary | ICD-10-CM | POA: Diagnosis not present

## 2015-09-13 DIAGNOSIS — Z1159 Encounter for screening for other viral diseases: Secondary | ICD-10-CM | POA: Diagnosis not present

## 2015-09-13 DIAGNOSIS — E559 Vitamin D deficiency, unspecified: Secondary | ICD-10-CM | POA: Diagnosis not present

## 2015-09-13 DIAGNOSIS — I6789 Other cerebrovascular disease: Secondary | ICD-10-CM | POA: Diagnosis not present

## 2015-09-13 DIAGNOSIS — J3089 Other allergic rhinitis: Secondary | ICD-10-CM

## 2015-09-13 DIAGNOSIS — D509 Iron deficiency anemia, unspecified: Secondary | ICD-10-CM | POA: Diagnosis not present

## 2015-09-13 DIAGNOSIS — M19071 Primary osteoarthritis, right ankle and foot: Secondary | ICD-10-CM

## 2015-09-13 DIAGNOSIS — G5601 Carpal tunnel syndrome, right upper limb: Secondary | ICD-10-CM | POA: Insufficient documentation

## 2015-09-13 DIAGNOSIS — I10 Essential (primary) hypertension: Secondary | ICD-10-CM | POA: Diagnosis not present

## 2015-09-13 DIAGNOSIS — G4733 Obstructive sleep apnea (adult) (pediatric): Secondary | ICD-10-CM | POA: Diagnosis not present

## 2015-09-13 DIAGNOSIS — G40909 Epilepsy, unspecified, not intractable, without status epilepticus: Secondary | ICD-10-CM

## 2015-09-13 DIAGNOSIS — G5751 Tarsal tunnel syndrome, right lower limb: Secondary | ICD-10-CM

## 2015-09-13 MED ORDER — AMLODIPINE BESYLATE 2.5 MG PO TABS: 2.5000 mg | ORAL_TABLET | Freq: Every day | ORAL | Status: DC

## 2015-09-13 MED ORDER — FLUTICASONE PROPIONATE 50 MCG/ACT NA SUSP: 2.0000 | Freq: Every day | NASAL | Status: DC

## 2015-09-13 MED ORDER — LOSARTAN POTASSIUM-HCTZ 100-12.5 MG PO TABS: 1.0000 | ORAL_TABLET | Freq: Every day | ORAL | Status: DC

## 2015-09-13 MED ORDER — VITAMIN D (ERGOCALCIFEROL) 1.25 MG (50000 UNIT) PO CAPS: ORAL_CAPSULE | ORAL | Status: DC

## 2015-09-13 NOTE — Progress Notes (Signed)
Name: Paula CombesCynthia L [name]   MRN: [id number]    DOB: Dec 11, 1960   Date:09/13/2015       Progress Note  Subjective  Chief Complaint  Chief Complaint  Patient presents with  . Medication Refill  . Foot Pain    patient stated that she has had some issues with her right ankle and has paperwork for Dr. Carlynn PurlSowles to review.  . Wheezing    patient stated she is not sure if she is having some sinus problems or not b/c she is having some issues breathing  . Ear Pain    patient stated that on yesterday she had some left ear pain    HPI  HTN: taking medication, and denies side effects, and is compliant with Losatan/hctz and amlodipine. BP has been at goal during her visits. No chest pain or palpitation  Right ankle and foot pain: she has been seeing Podiatrist, and is supposed to be taking Diclofenac but she was afraid to do so. She states pain is intermittent, sharp, and sometimes has sensation of pin and needles. She had steroid injection and already took a medrol dose pain. She is currently wearing an ankle brace and seems to improve symptoms.   Seizure disorder: she is driving again since June 2016, she has been seizure free over the past four  years. She is compliant with medications, and denies side effects [facility]   Asthma : mild intermittently, she has noticed some wheezing lately, states symptoms are worse with high humidity, SOB is stable, does not taking daily medication, using rescue inhaler about once a week at most. No nocturnal symptoms  OSA: used to wear CPAP but had lost weight and was off CPAP, gaining weight again, she is not sure if she snores, waking up feeling rested  Allergic Rhinitis: she is only taking medication prn, yesterday had some left ear pain, described as sharp, but resolved now, no cold symptoms or fever  Obesity: she continues to gain weight, she states she has joined a gym and will try to lose weight again   Patient Active Problem List   Diagnosis  Date Noted  . Primary osteoarthritis, right ankle and foot 09/13/2015  . Carpal tunnel syndrome, right 09/13/2015  . Tarsal tunnel syndrome of right side 09/13/2015  . 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 Asthma, mild intermittent 08/15/2014  . Benign hypertension 08/15/2014  . Herpes genitalis in women 08/15/2014  . Allergic rhinitis 08/15/2014  . Cerebral seizure (HCC) 08/15/2014  . Vitamin D deficiency 08/15/2014  . Morbid obesity (HCC) 08/03/2014  . Adhesive capsulitis of left shoulder 06/16/2013  . Obstructive sleep apnea of adult 06/16/2013  . Epilepsy (HCC) 06/16/2013  . Intractable epilepsy (HCC) 06/16/2013  . Anemia, iron deficiency 09/30/2006    Past Surgical History  Procedure Laterality Date  . Breast surgery Left     benign lump at age 55    Family History  Problem Relation Age of Onset  . Cancer Mother     Breast  . Hypertension Mother   . Diabetes Mother   . Hyperlipidemia Mother     Social History   Social History  . Marital Status: Single    Spouse Name: N/A  . Number of Children: N/A  . Years of Education: N/A   Occupational History  . Not on file.   Social History Main Topics  . Smoking status: Never Smoker   . Smokeless tobacco: Not on  file  . Alcohol Use: No  . Drug Use: No  . Sexual Activity: Not Currently   Other Topics Concern  . Not on file   Social History Narrative     Current outpatient prescriptions:  .  albuterol (PROVENTIL HFA;VENTOLIN HFA) 108 (90 BASE) MCG/ACT inhaler, Inhale 2 puffs into the lungs every 6 (six) hours as needed., Disp: , Rfl:  .  amLODipine (NORVASC) 2.5 MG tablet, Take 1 tablet (2.5 mg total) by mouth daily., Disp: 90 tablet, Rfl: 1 .  carbamazepine (TEGRETOL) 200 MG tablet, TEGRETOL,  (Oral Tablet)  one a half m and afternoon and 2 and half qhs for 0 days  Quantity: 1.00;  Refills: 0   Ordered :26-Jan-2010  Alba Cory MD;  Started 14-Mar-2008  Active, Disp: , Rfl:  .  diclofenac (VOLTAREN) 75 MG EC tablet, Take 1 tablet (75 mg total) by mouth 2 (two) times daily., Disp: 30 tablet, Rfl: 0 .  fluticasone (FLONASE) 50 MCG/ACT nasal spray, Place 2 sprays into both nostrils daily., Disp: 48 g, Rfl: 1 .  levETIRAcetam (KEPPRA) 500 MG tablet, Take 3 tablets by mouth 2 (two) times daily., Disp: , Rfl:  .  loratadine (CLARITIN) 10 MG tablet, Take 1 tablet by mouth 2 (two) times daily as needed., Disp: , Rfl:  .  losartan-hydrochlorothiazide (HYZAAR) 100-12.5 MG tablet, Take 1 tablet by mouth daily., Disp: 90 tablet, Rfl: 1 .  topiramate (TOPAMAX) 100 MG tablet, Take 3 tablets by mouth 3 (three) times daily., Disp: , Rfl:  .  Vitamin D, Ergocalciferol, (DRISDOL) 50000 units CAPS capsule, Take 1 capsule by mouth  every 7 days, Disp: 12 capsule, Rfl: 0  Current facility-administered medications:  .  triamcinolone acetonide (KENALOG) 10 MG/ML injection 10 mg, 10 mg, Other, Once, Asencion Islam, DPM  Allergies  Allergen Reactions  . Ace Inhibitors      ROS  Ten systems reviewed and is negative except as mentioned in HPI  Objective  Filed Vitals:   09/13/15 0947  Pulse: 84  Temp: 98.7 F (37.1 C)  TempSrc: Oral  Resp: 16  Weight: 307 lb 1.6 oz (139.3 kg)  SpO2: 98%    Body mass index is 56.16 kg/(m^2).  Physical Exam  Constitutional: Patient appears well-developed and well-nourished. Obese  No distress.  HEENT: head atraumatic, normocephalic, pupils equal and reactive to light, ears normal TM bilaterally- pain likely secondary from allergies and needs to resume Fluticasone,  neck supple, throat within normal limits Cardiovascular: Normal rate, regular rhythm and normal heart sounds.  No murmur heard. No BLE edema. Pulmonary/Chest: Effort normal and breath sounds normal. No respiratory distress. Abdominal: Soft.  There is no tenderness. Psychiatric: Patient has a normal mood and affect. behavior is normal. Judgment and thought  content normal. Muscular skeletal: wearing a brace of right ankle  Recent Results (from the past 2160 hour(s))  CBC with Differential/Platelet     Status: Abnormal   Collection Time: 08/14/15  2:29 PM  Result Value Ref Range   WBC 3.8 3.6 - 11.0 K/uL   RBC 3.75 (L) 3.80 - 5.20 MIL/uL   Hemoglobin 10.1 (L) 12.0 - 16.0 g/dL   HCT 16.1 (L) 09.6 - 04.5 %   MCV 82.3 80.0 - 100.0 fL   MCH 27.0 26.0 - 34.0 pg   MCHC 32.8 32.0 - 36.0 g/dL   RDW 40.9 (H) 81.1 - 91.4 %   Platelets 220 150 - 440 K/uL   Neutrophils Relative % 53 %   Neutro  Abs 2.0 1.4 - 6.5 K/uL   Lymphocytes Relative 32 %   Lymphs Abs 1.2 1.0 - 3.6 K/uL   Monocytes Relative 9 %   Monocytes Absolute 0.3 0.2 - 0.9 K/uL   Eosinophils Relative 5 %   Eosinophils Absolute 0.2 0 - 0.7 K/uL   Basophils Relative 1 %   Basophils Absolute 0.0 0 - 0.1 K/uL  Ferritin     Status: None   Collection Time: 08/14/15  2:29 PM  Result Value Ref Range   Ferritin 43 11 - 307 ng/mL  Iron and TIBC     Status: Abnormal   Collection Time: 08/14/15  2:29 PM  Result Value Ref Range   Iron 25 (L) 28 - 170 ug/dL   TIBC 161249 (L) 096250 - 045450 ug/dL   Saturation Ratios 10 (L) 10.4 - 31.8 %   UIBC 224 ug/dL     WUJ8/1PHQ2/9: [facility] 09/13/2015 08/15/2014  Decreased Interest 0 0  Down, Depressed, Hopeless 0 0  PHQ - 2 Score 0 0     Fall Risk: Fall Risk  09/13/2015 08/15/2014  Falls in the past year? No No    Functional Status Survey: Is the patient deaf or have difficulty hearing?: No Does the patient have difficulty seeing, even when wearing glasses/contacts?: No Does the patient have difficulty concentrating, remembering, or making decisions?: No Does the patient have difficulty walking or climbing stairs?: Yes Does the patient have difficulty dressing or bathing?: No Does the patient have difficulty doing errands alone such as visiting a doctor's office or shopping?: No    Assessment & Plan  1. Benign hypertension  At goal,  check labs - losartan-hydrochlorothiazide (HYZAAR) 100-12.5 MG tablet; Take 1 tablet by mouth daily.  Dispense: 90 tablet; Refill: 1 - amLODipine (NORVASC) 2.5 MG tablet; Take 1 tablet (2.5 mg total) by mouth daily.  Dispense: 90 tablet; Refill: 1 - Comprehensive metabolic panel - Lipid panel  2. Vitamin D deficiency  - Vitamin D, Ergocalciferol, (DRISDOL) 50000 units CAPS capsule; Take 1 capsule by mouth  every 7 days  Dispense: 12 capsule; Refill: 0 - VITAMIN D 25 Hydroxy (Vit-D Deficiency, Fractures)  3. Anemia, iron deficiency  Keep follow up with hematologist, high iron diet and iron infusion, she is post-menopausal now and it should normalize soon  4. Asthma, mild intermittent, uncomplicated  Continue prn medication  5. Other allergic rhinitis  - fluticasone (FLONASE) 50 MCG/ACT nasal spray; Place 2 sprays into both nostrils daily.  Dispense: 48 g; Refill: 1  6. Cerebral seizure (HCC)  Continue follow up with Neurologist  7. Morbid obesity, unspecified obesity type [facility](HCC)  Discussed with the patient the risk posed by an increased BMI. Discussed importance of portion control, calorie counting and at least 150 minutes of physical activity weekly. Avoid sweet beverages and drink more water. Eat at least 6 servings of fruit and vegetables daily  - Lipid panel - Hemoglobin A1c  8. Need for hepatitis C screening test  - Hepatitis C antibody  9. Primary osteoarthritis, right ankle and foot  Discussed possible risk of Diclofenac  10. Tarsal tunnel syndrome of right side  Keep follow up with Podiatrist  11. Obstructive sleep apnea of adult  Discuss with Neurologist, may need repeat sleep study

## 2015-09-19 LAB — COMPREHENSIVE METABOLIC PANEL
A/G RATIO: 1.4 (ref 1.2–2.2)
ALK PHOS: 124 IU/L — AB (ref 39–117)
ALT: 13 IU/L (ref 0–32)
AST: 11 IU/L (ref 0–40)
Albumin: 4 g/dL (ref 3.5–5.5)
BUN/Creatinine Ratio: 16 (ref 9–23)
BUN: 13 mg/dL (ref 6–24)
CALCIUM: 9.1 mg/dL (ref 8.7–10.2)
CHLORIDE: 102 mmol/L (ref 96–106)
CO2: 24 mmol/L (ref 18–29)
Creatinine, Ser: 0.8 mg/dL (ref 0.57–1.00)
GFR calc Af Amer: 97 mL/min/{1.73_m2} (ref >59)
GFR, EST NON AFRICAN AMERICAN: 84 mL/min/{1.73_m2} (ref >59)
GLOBULIN, TOTAL: 2.9 g/dL (ref 1.5–4.5)
Glucose: 89 mg/dL (ref 65–99)
POTASSIUM: 4.2 mmol/L (ref 3.5–5.2)
SODIUM: 140 mmol/L (ref 134–144)
Total Protein: 6.9 g/dL (ref 6.0–8.5)

## 2015-09-19 LAB — HEMOGLOBIN A1C
Est. average glucose Bld gHb Est-mCnc: 111 mg/dL
Hgb A1c MFr Bld: 5.5 % (ref 4.8–5.6)

## 2015-09-19 LAB — LIPID PANEL
CHOL/HDL RATIO: 2.1 ratio (ref 0.0–4.4)
CHOLESTEROL TOTAL: 196 mg/dL (ref 100–199)
HDL: 92 mg/dL (ref >39)
LDL Calculated: 87 mg/dL (ref 0–99)
TRIGLYCERIDES: 86 mg/dL (ref 0–149)
VLDL Cholesterol Cal: 17 mg/dL (ref 5–40)

## 2015-09-19 LAB — VITAMIN D 25 HYDROXY (VIT D DEFICIENCY, FRACTURES): VIT D 25 HYDROXY: 7.5 ng/mL — AB (ref 30.0–100.0)

## 2015-09-19 LAB — HEPATITIS C ANTIBODY

## 2015-09-21 ENCOUNTER — Other Ambulatory Visit: Payer: Self-pay | Admitting: Family Medicine

## 2015-09-21 DIAGNOSIS — E559 Vitamin D deficiency, unspecified: Secondary | ICD-10-CM

## 2015-09-21 MED ORDER — VITAMIN D (ERGOCALCIFEROL) 1.25 MG (50000 UNIT) PO CAPS: ORAL_CAPSULE | ORAL | Status: DC

## 2015-09-29 ENCOUNTER — Ambulatory Visit (INDEPENDENT_AMBULATORY_CARE_PROVIDER_SITE_OTHER): Payer: 59 | Admitting: Sports Medicine

## 2015-09-29 ENCOUNTER — Encounter: Payer: Self-pay | Admitting: Sports Medicine

## 2015-09-29 DIAGNOSIS — M79671 Pain in right foot: Secondary | ICD-10-CM

## 2015-09-29 DIAGNOSIS — M25571 Pain in right ankle and joints of right foot: Secondary | ICD-10-CM | POA: Diagnosis not present

## 2015-09-29 DIAGNOSIS — G5751 Tarsal tunnel syndrome, right lower limb: Secondary | ICD-10-CM

## 2015-09-29 DIAGNOSIS — M19071 Primary osteoarthritis, right ankle and foot: Secondary | ICD-10-CM

## 2015-09-29 DIAGNOSIS — M729 Fibroblastic disorder, unspecified: Secondary | ICD-10-CM

## 2015-09-29 DIAGNOSIS — M779 Enthesopathy, unspecified: Secondary | ICD-10-CM

## 2015-09-29 MED ORDER — TRIAMCINOLONE ACETONIDE 10 MG/ML IJ SUSP: 10.0000 mg | Freq: Once | INTRAMUSCULAR | Status: DC

## 2015-09-29 NOTE — Progress Notes (Signed)
Patient ID: YITTY ROADS, female   DOB: 1960-06-06, 55 y.o.   MRN: [id number]   Subjective: Paula [name] is a 55 y.o. female patient who returns to office for evaluation of right ankle pain, patient had injection 4 weeks ago and states that the shot helped and that her foot is "doing all right". Patient has tried wearing tri-lock states she has difficulty with the brace and bought another brace that slips on that she is able to wear a little better. Reports she did not take the diclofenac 2x daily because she did not want it to interact with her other meds. Patient denies any other pedal complaints.  Patient Active Problem List   Diagnosis Date Noted  . Primary osteoarthritis, right ankle and foot 09/13/2015  . Carpal tunnel syndrome, right 09/13/2015  . Tarsal tunnel syndrome of right side 09/13/2015  . 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 Asthma, mild intermittent 08/15/2014  . Benign hypertension 08/15/2014  . Herpes genitalis in women 08/15/2014  . Allergic rhinitis 08/15/2014  . Cerebral seizure (HCC) 08/15/2014  . Vitamin D deficiency 08/15/2014  . Morbid obesity (HCC) 08/03/2014  . Adhesive capsulitis of left shoulder 06/16/2013  . Obstructive sleep apnea of adult 06/16/2013  . Epilepsy (HCC) 06/16/2013  . Intractable epilepsy (HCC) 06/16/2013  . Anemia, iron deficiency 09/30/2006    Current Outpatient Prescriptions on File Prior to Visit  Medication Sig Dispense Refill  . albuterol (PROVENTIL HFA;VENTOLIN HFA) 108 (90 BASE) MCG/ACT inhaler Inhale 2 puffs into the lungs every 6 (six) hours as needed.    Marland Kitchen amLODipine (NORVASC) 2.5 MG tablet Take 1 tablet (2.5 mg total) by mouth daily. 90 tablet 1  . carbamazepine (TEGRETOL) 200 MG tablet TEGRETOL,  (Oral Tablet)  one a half m and afternoon and 2 and half qhs for 0 days  Quantity: 1.00;  Refills: 0   Ordered :26-Jan-2010  Alba Cory MD;  Mora Appl 14-Mar-2008 Active    .  diclofenac (VOLTAREN) 75 MG EC tablet Take 1 tablet (75 mg total) by mouth 2 (two) times daily. 30 tablet 0  . fluticasone (FLONASE) 50 MCG/ACT nasal spray Place 2 sprays into both nostrils daily. 48 g 1  . levETIRAcetam (KEPPRA) 500 MG tablet Take 3 tablets by mouth 2 (two) times daily.    Marland Kitchen loratadine (CLARITIN) 10 MG tablet Take 1 tablet by mouth 2 (two) times daily as needed.    Marland Kitchen losartan-hydrochlorothiazide (HYZAAR) 100-12.5 MG tablet Take 1 tablet by mouth daily. 90 tablet 1  . topiramate (TOPAMAX) 100 MG tablet Take 3 tablets by mouth 3 (three) times daily.    . Vitamin D, Ergocalciferol, (DRISDOL) 50000 units CAPS capsule Take 1 capsule by mouth  every 7 days 12 capsule 0   Current Facility-Administered Medications on File Prior to Visit  Medication Dose Route Frequency Provider Last Rate Last Dose  . triamcinolone acetonide (KENALOG) 10 MG/ML injection 10 mg  10 mg Other Once Asencion Islam, DPM        Allergies  Allergen Reactions  . Ace Inhibitors     Objective:  General: Alert and oriented x3 in no acute distress  Dermatology: No open lesions bilateral lower extremities, no webspace macerations, no ecchymosis bilateral, all nails x 10 are well manicured.  Vascular: Dorsalis Pedis and Posterior Tibial pedal pulses palpable, Capillary Fill Time 3 seconds,(+) pedal hair growth bilateral, Trace edema bilateral lower extremities, right ankle greater than left, Temperature gradient within normal limits.  Neurology: Michaell CowingGross sensation intact via light touch bilateral, Protective sensation intact  with Phoebe PerchSemmes Weinstein Monofilament to all pedal sites, Position sense intact, vibratory intact bilateral, Deep tendon reflexes within normal limits bilateral, No babinski sign present bilateral. (- )Tinels sign bilateral.   Musculoskeletal: Mild tenderness with palpation at Right ankle at lateral ankle ligaments and extensor tendon complex with most tender point at sinus tarsi. Negative talar  tilt, Negative tib-fib stress, No instability. No reproducible pain at plantar fascia. No pain with calf compression bilateral. Range of motion within normal limits with mild guarding on right ankle. Strength within normal limits in all groups bilateral. Mild hammertoe and pes planus.  Assessment and Plan: Problem List Items Addressed This Visit    None    Visit Diagnoses    Right foot pain    -  Primary    Relevant Medications    triamcinolone acetonide (KENALOG) 10 MG/ML injection 10 mg    Ankle pain, right        Relevant Medications    triamcinolone acetonide (KENALOG) 10 MG/ML injection 10 mg    Sinus tarsi syndrome of right ankle        Relevant Medications    triamcinolone acetonide (KENALOG) 10 MG/ML injection 10 mg    Tendonitis        Relevant Medications    triamcinolone acetonide (KENALOG) 10 MG/ML injection 10 mg    Osteoarthritis of ankle and foot, right        Relevant Medications    triamcinolone acetonide (KENALOG) 10 MG/ML injection 10 mg    Fasciitis          -Complete examination performed -Previous Xrays reviewed -Discussed treatement options and continued care for  tendinitis with sinus tarsi syndrome and arthritis secondary to foot type -After oral consent and aseptic prep, injected a mixture containing 1 ml of 2%  plain lidocaine, 1 ml 0.5% plain marcaine, 0.5 ml of kenalog 40 and 0.5 ml of dexamethasone phosphate into right sinus tarsi without complication. This is injection #2. Post-injection care discussed with patient.  -Continue with Tri-Lock ankle brace for right Outside of work and soft slip on ankle brace when at work -Scanned patient for new set of orthotics -Continue with Diclofenac until completed -Recommend, rest, ice, elevation daily until symptoms improve -Patient to return to office pick up orthotics or sooner if condition worsens.   Asencion Islamitorya Akshith Moncus, DPM

## 2015-10-13 ENCOUNTER — Encounter: Payer: Self-pay | Admitting: *Deleted

## 2015-10-20 ENCOUNTER — Encounter: Payer: Self-pay | Admitting: Sports Medicine

## 2015-10-20 ENCOUNTER — Ambulatory Visit (INDEPENDENT_AMBULATORY_CARE_PROVIDER_SITE_OTHER): Payer: 59 | Admitting: Sports Medicine

## 2015-10-20 DIAGNOSIS — M729 Fibroblastic disorder, unspecified: Secondary | ICD-10-CM

## 2015-10-20 DIAGNOSIS — G5751 Tarsal tunnel syndrome, right lower limb: Secondary | ICD-10-CM

## 2015-10-20 DIAGNOSIS — M25571 Pain in right ankle and joints of right foot: Secondary | ICD-10-CM

## 2015-10-20 DIAGNOSIS — M19071 Primary osteoarthritis, right ankle and foot: Secondary | ICD-10-CM

## 2015-10-20 DIAGNOSIS — M779 Enthesopathy, unspecified: Secondary | ICD-10-CM

## 2015-10-20 DIAGNOSIS — M79671 Pain in right foot: Secondary | ICD-10-CM

## 2015-10-20 NOTE — Patient Instructions (Signed)

## 2015-10-20 NOTE — Progress Notes (Signed)
Patient discussed with medical assistant. One pair of refurbished Custom Functional orthotics were fitted and dispensed to patient with wear instructions and break in period explained. Patient to follow up as scheduled for continued care or sooner if problems or issues arise. -Dr. Marylene LandStover

## 2015-12-18 ENCOUNTER — Encounter: Payer: Self-pay | Admitting: Family Medicine

## 2015-12-18 ENCOUNTER — Ambulatory Visit (INDEPENDENT_AMBULATORY_CARE_PROVIDER_SITE_OTHER): Payer: 59 | Admitting: Family Medicine

## 2015-12-18 VITALS — BP 134/76 | HR 82 | Temp 98.1°F | Resp 16 | Ht 62.0 in | Wt 307.5 lb

## 2015-12-18 DIAGNOSIS — I10 Essential (primary) hypertension: Secondary | ICD-10-CM

## 2015-12-18 DIAGNOSIS — J4520 Mild intermittent asthma, uncomplicated: Secondary | ICD-10-CM

## 2015-12-18 DIAGNOSIS — D509 Iron deficiency anemia, unspecified: Secondary | ICD-10-CM | POA: Diagnosis not present

## 2015-12-18 DIAGNOSIS — G4733 Obstructive sleep apnea (adult) (pediatric): Secondary | ICD-10-CM | POA: Diagnosis not present

## 2015-12-18 DIAGNOSIS — Z23 Encounter for immunization: Secondary | ICD-10-CM

## 2015-12-18 DIAGNOSIS — E559 Vitamin D deficiency, unspecified: Secondary | ICD-10-CM

## 2015-12-18 MED ORDER — LOSARTAN POTASSIUM-HCTZ 100-12.5 MG PO TABS: 1.0000 | ORAL_TABLET | Freq: Every day | ORAL | 1 refills | Status: DC

## 2015-12-18 MED ORDER — AMLODIPINE BESYLATE 2.5 MG PO TABS: 2.5000 mg | ORAL_TABLET | Freq: Every day | ORAL | 1 refills | Status: DC

## 2015-12-18 MED ORDER — VITAMIN D (ERGOCALCIFEROL) 1.25 MG (50000 UNIT) PO CAPS: ORAL_CAPSULE | ORAL | 1 refills | Status: DC

## 2015-12-18 NOTE — Progress Notes (Signed)
Name: Paula [name]   MRN: [id number]    DOB: May 25, 1960   Date:12/18/2015       Progress Note  Subjective  Chief Complaint  Chief Complaint  Patient presents with  . Medication Refill    3 month F/U  . Obesity    Patient weight is unchanged  . Hypertension    Edema in Bilateral feet and is wearing new orthotics  . Asthma    Worst when the weather changes  . Allergic Rhinitis     HPI  HTN: taking medication, and denies side effects, and is compliant with Losatan/hctz and amlodipine. BP has been at goal during her visits. No chest pain, SOB or  palpitation  Right ankle and foot pain: she was seeing  Podiatrist, she is doing better since she was given steroid injections and wore a brace, she was given the diagnosed of OA, advised her to stop Diclofenac and take Tylenol prn for pain  Seizure disorder: she is driving again since June 2016, she has been seizure free over the past four  years. She is compliant with medications, and denies side effects Continue follow up at [facility]- Dr. Alessandra Bevels   Asthma : mild intermittently, she has been doing well now, no wheezing or SOB , she only uses inhaler prn, and has not used in the past few months. No nocturnal symptoms  OSA: used to wear CPAP but had lost weight and was off CPAP, gaining weight again, she is not sure if she snores, waking up feeling rested, she states Dr. Alessandra Bevels sent her for another sleep study and she does not need CPAP at this time  Allergic Rhinitis: she is only taking medication prn, she denies rhinorrhea or nasal congestion at this time  Obesity: she did not gain any weight since last visit, she joined a gym but has not gone to the gym yet.   Anemia: post-menopausal now, she sees hematologist   Vitamin D deficiency: needs to take Vitamin D rx daily   Patient Active Problem List   Diagnosis Date Noted  . Primary osteoarthritis, right ankle and foot 09/13/2015  . Carpal tunnel syndrome, right 09/13/2015  . Tarsal  tunnel syndrome of right side 09/13/2015  . 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 Asthma, mild intermittent 08/15/2014  . Benign hypertension 08/15/2014  . Herpes genitalis in women 08/15/2014  . Allergic rhinitis 08/15/2014  . Cerebral seizure 08/15/2014  . Vitamin D deficiency 08/15/2014  . Morbid obesity (HCC) 08/03/2014  . Adhesive capsulitis of left shoulder 06/16/2013  . Obstructive sleep apnea of adult 06/16/2013  . Epilepsy (HCC) 06/16/2013  . Intractable epilepsy (HCC) 06/16/2013  . Anemia, iron deficiency 09/30/2006    Past Surgical History:  Procedure Laterality Date  . BREAST SURGERY Left    benign lump at age 57    Family History  Problem Relation Age of Onset  . Cancer Mother     Breast  . Hypertension Mother   . Diabetes Mother   . Hyperlipidemia Mother     Social History   Social History  . Marital status: Single    Spouse name: N/A  . Number of children: N/A  . Years of education: N/A   Occupational History  . Not on file.   Social History Main Topics  . Smoking status: Never Smoker  . Smokeless tobacco: Not on file  . Alcohol use No  . Drug use: No  . Sexual activity: Not Currently  Other Topics Concern  . Not on file   Social History Narrative  . No narrative on file     Current Outpatient Prescriptions:  .  albuterol (PROVENTIL HFA;VENTOLIN HFA) 108 (90 BASE) MCG/ACT inhaler, Inhale 2 puffs into the lungs every 6 (six) hours as needed., Disp: , Rfl:  .  amLODipine (NORVASC) 2.5 MG tablet, Take 1 tablet (2.5 mg total) by mouth daily., Disp: 90 tablet, Rfl: 1 .  carbamazepine (TEGRETOL) 200 MG tablet, TEGRETOL, 200MG  (Oral Tablet)  one a half m and afternoon and 2 and half qhs for 0 days  Quantity: 1.00;  Refills: 0   Ordered :26-Jan-2010  Alba CorySOWLES, Lunna Vogelgesang MD;  Started 14-Mar-2008 Active, Disp: , Rfl:  .  diclofenac (VOLTAREN) 75 MG EC tablet, Take 1 tablet (75 mg total) by mouth 2 (two) times  daily., Disp: 30 tablet, Rfl: 0 .  fluticasone (FLONASE) 50 MCG/ACT nasal spray, Place 2 sprays into both nostrils daily., Disp: 48 g, Rfl: 1 .  levETIRAcetam (KEPPRA) 500 MG tablet, Take 3 tablets by mouth 2 (two) times daily., Disp: , Rfl:  .  losartan-hydrochlorothiazide (HYZAAR) 100-12.5 MG tablet, Take 1 tablet by mouth daily., Disp: 90 tablet, Rfl: 1 .  topiramate (TOPAMAX) 100 MG tablet, Take 3 tablets by mouth 3 (three) times daily., Disp: , Rfl:  .  Vitamin D, Ergocalciferol, (DRISDOL) 50000 units CAPS capsule, Take 1 capsule by mouth  every 7 days, Disp: 12 capsule, Rfl: 1  Allergies  Allergen Reactions  . Ace Inhibitors      ROS  Constitutional: Negative for fever or weight change.  Respiratory: Negative for cough and shortness of breath.   Cardiovascular: Negative for chest pain or palpitations.  Gastrointestinal: Negative for abdominal pain, no bowel changes.  Musculoskeletal: Negative for gait problem or joint swelling.  Skin: She states that a couple of weeks ago she had redness and pain on left lower leg, but resolved now, likely cellulitis advised to come in if it happens again Neurological: Negative for dizziness or headache.  No other specific complaints in a complete review of systems (except as listed in HPI above).  Objective  Vitals:   12/18/15 0924  BP: 134/76  Pulse: 82  Resp: 16  Temp: 98.1 F (36.7 C)  TempSrc: Oral  SpO2: 99%  Weight: (!) 307 lb 8 oz (139.5 kg)  Height: 5\' 2"  (1.575 m)    Body mass index is 56.24 kg/m.  Physical Exam  Constitutional: Patient appears well-developed and well-nourished. Obese No distress.  HEENT: head atraumatic, normocephalic, pupils equal and reactive to light, neck supple, throat within normal limits Cardiovascular: Normal rate, regular rhythm and normal heart sounds.  No murmur heard. Trace BLE edema. Pulmonary/Chest: Effort normal and breath sounds normal. No respiratory distress. Abdominal: Soft.  There  is no tenderness. Psychiatric: Patient has a normal mood and affect. behavior is normal. Judgment and thought content normal. Skin: no sings of infection on legs, she has thick toenails, but no tinea pedis  PHQ2/9: Depression screen [facility] 2/9 12/18/2015 09/13/2015 08/15/2014  Decreased Interest 0 0 0  Down, Depressed, Hopeless 0 0 0  PHQ - 2 Score 0 0 0     Fall Risk: Fall Risk  12/18/2015 09/13/2015 08/15/2014  Falls in the past year? No No No     Functional Status Survey: Is the patient deaf or have difficulty hearing?: No Does the patient have difficulty seeing, even when wearing glasses/contacts?: No Does the patient have difficulty concentrating, remembering, or making  decisions?: No Does the patient have difficulty walking or climbing stairs?: No Does the patient have difficulty dressing or bathing?: No Does the patient have difficulty doing errands alone such as visiting a doctor's office or shopping?: No    Assessment & Plan  1. Mild intermittent asthma without complication  - Spirometry: Peak - normal   2. Needs flu shot  - Flu Vaccine QUAD 36+ mos PF IM (Fluarix & Fluzone Quad PF)  3. Morbid obesity, unspecified obesity type [facility])  Discussed with the patient the risk posed by an increased BMI. Discussed importance of portion control, calorie counting and at least 150 minutes of physical activity weekly. Avoid sweet beverages and drink more water. Eat at least 6 servings of fruit and vegetables daily   4. Benign hypertension  - amLODipine (NORVASC) 2.5 MG tablet; Take 1 tablet (2.5 mg total) by mouth daily.  Dispense: 90 tablet; Refill: 1 - losartan-hydrochlorothiazide (HYZAAR) 100-12.5 MG tablet; Take 1 tablet by mouth daily.  Dispense: 90 tablet; Refill: 1  5. Vitamin D deficiency  - Vitamin D, Ergocalciferol, (DRISDOL) 50000 units CAPS capsule; Take 1 capsule by mouth  every 7 days  Dispense: 12 capsule; Refill: 1  6. Obstructive sleep apnea of adult  Per patient  recently re-tested and dose not needs CPAP at this time  7. Iron deficiency anemia, unspecified iron deficiency anemia type  Continue follow up with hematologist

## 2016-02-06 ENCOUNTER — Encounter: Payer: Self-pay | Admitting: Oncology

## 2016-02-12 ENCOUNTER — Inpatient Hospital Stay: Payer: 59 | Attending: Oncology | Admitting: Oncology

## 2016-02-12 ENCOUNTER — Inpatient Hospital Stay: Payer: 59

## 2016-02-12 VITALS — BP 138/81 | HR 64 | Temp 98.6°F | Resp 18

## 2016-02-12 DIAGNOSIS — G40909 Epilepsy, unspecified, not intractable, without status epilepticus: Secondary | ICD-10-CM | POA: Diagnosis not present

## 2016-02-12 DIAGNOSIS — Z803 Family history of malignant neoplasm of breast: Secondary | ICD-10-CM | POA: Diagnosis not present

## 2016-02-12 DIAGNOSIS — D509 Iron deficiency anemia, unspecified: Secondary | ICD-10-CM | POA: Diagnosis not present

## 2016-02-12 DIAGNOSIS — Z79899 Other long term (current) drug therapy: Secondary | ICD-10-CM | POA: Insufficient documentation

## 2016-02-12 DIAGNOSIS — R5383 Other fatigue: Secondary | ICD-10-CM | POA: Diagnosis not present

## 2016-02-12 DIAGNOSIS — G4730 Sleep apnea, unspecified: Secondary | ICD-10-CM | POA: Insufficient documentation

## 2016-02-12 DIAGNOSIS — R531 Weakness: Secondary | ICD-10-CM | POA: Diagnosis not present

## 2016-02-12 DIAGNOSIS — I10 Essential (primary) hypertension: Secondary | ICD-10-CM | POA: Insufficient documentation

## 2016-02-12 NOTE — Progress Notes (Signed)
Complains of pain in both feet.

## 2016-02-12 NOTE — Progress Notes (Signed)
[facility]  Telephone:([phone] Fax:(336) [phone]  ID: Jacqualin Combesynthia L Beever OB: 1960-09-21  MR#: [id number]  WUX#:[id number]#:[id number]  Patient Care Team: Alba CoryKrichna Sowles, MD as PCP - General (Family Medicine)  CHIEF COMPLAINT: Iron deficiency anemia.  INTERVAL HISTORY: Patient returns to clinic today for repeat laboratory work and clinical evaluation. She is still feeling tired and sluggish. She continues to be active and work full-time. She denies fever or chills. She denies any weight loss or weight gain. She denies any current chest pain or shortness of breath.  She offers no specific complaints today.  REVIEW OF SYSTEMS:   Review of Systems  Constitutional: Positive for malaise/fatigue. Negative for fever and weight loss.  Respiratory: Negative for cough and shortness of breath.   Cardiovascular: Negative.  Negative for chest pain and leg swelling.  Gastrointestinal: Negative.  Negative for abdominal pain, blood in stool and melena.  Musculoskeletal: Negative.   Neurological: Positive for weakness.  Psychiatric/Behavioral: Negative.  The patient is not nervous/anxious.     As per HPI. Otherwise, a complete review of systems is negative.  PAST MEDICAL HISTORY: Anemia, hypertension, seizure disorder, sleep apnea, hypertension, left breast lumpectomy with Dr. Lemar LivingsByrnett. Colonoscopy in 2004.  PAST SURGICAL HISTORY: Past Surgical History:  Procedure Laterality Date  . BREAST SURGERY Left    benign lump at age 55    FAMILY HISTORY Family History  Problem Relation Age of Onset  . Cancer Mother     Breast  . Hypertension Mother   . Diabetes Mother   . Hyperlipidemia Mother        ADVANCED DIRECTIVES:    HEALTH MAINTENANCE: Social History  Substance Use Topics  . Smoking status: Never Smoker  . Smokeless tobacco: Not on file  . Alcohol use No     Allergies  Allergen Reactions  . Ace Inhibitors     Current Outpatient Prescriptions  Medication Sig  Dispense Refill  . albuterol (PROVENTIL HFA;VENTOLIN HFA) 108 (90 BASE) MCG/ACT inhaler Inhale 2 puffs into the lungs every 6 (six) hours as needed.    Marland Kitchen. amLODipine (NORVASC) 2.5 MG tablet Take 1 tablet (2.5 mg total) by mouth daily. 90 tablet 1  . carbamazepine (TEGRETOL) 200 MG tablet TEGRETOL, 200MG  (Oral Tablet)  one a half m and afternoon and 2 and half qhs for 0 days  Quantity: 1.00;  Refills: 0   Ordered :26-Jan-2010  Alba CorySOWLES, KRICHNA MD;  Mora ApplStarted 14-Mar-2008 Active    . fluticasone (FLONASE) 50 MCG/ACT nasal spray Place 2 sprays into both nostrils daily. 48 g 1  . levETIRAcetam (KEPPRA) 500 MG tablet Take 3 tablets by mouth 2 (two) times daily.    Marland Kitchen. losartan-hydrochlorothiazide (HYZAAR) 100-12.5 MG tablet Take 1 tablet by mouth daily. 90 tablet 1  . topiramate (TOPAMAX) 100 MG tablet Take 3 tablets by mouth 3 (three) times daily.    . Vitamin D, Ergocalciferol, (DRISDOL) 50000 units CAPS capsule Take 1 capsule by mouth  every 7 days 12 capsule 1   No current facility-administered medications for this visit.     OBJECTIVE: Vitals:   02/12/16 0955  BP: 138/81  Pulse: 64  Resp: 18  Temp: 98.6 F (37 C)     There is no height or weight on file to calculate BMI.    ECOG FS:0 - Asymptomatic  General: Well-developed, well-nourished, no acute distress. Eyes: Pink conjunctiva, anicteric sclera. Lungs: Clear to auscultation bilaterally. Heart: Regular rate and rhythm. No rubs, murmurs, or gallops. Abdomen: Soft, nontender, nondistended. No  organomegaly noted, normoactive bowel sounds. Musculoskeletal: No edema, cyanosis, or clubbing. Neuro: Alert, answering all questions appropriately. Cranial nerves grossly intact. Skin: No rashes or petechiae noted. Psych: Normal affect.   LAB RESULTS:  Lab Results  Component Value Date   NA 140 09/18/2015   K 4.2 09/18/2015   CL 102 09/18/2015   CO2 24 09/18/2015   GLUCOSE 89 09/18/2015   BUN 13 09/18/2015   CREATININE 0.80 09/18/2015    CALCIUM 9.1 09/18/2015   PROT 6.9 09/18/2015   ALBUMIN 4.0 09/18/2015   AST 11 09/18/2015   ALT 13 09/18/2015   ALKPHOS 124 (H) 09/18/2015   BILITOT <0.2 09/18/2015   GFRNONAA 84 09/18/2015   GFRAA 97 09/18/2015    Lab Results  Component Value Date   WBC 3.8 08/14/2015   NEUTROABS 2.0 08/14/2015   HGB 10.1 (L) 08/14/2015   HCT 30.9 (L) 08/14/2015   MCV 82.3 08/14/2015   PLT 220 08/14/2015     STUDIES: No results found.  ASSESSMENT: Iron deficiency anemia.  PLAN:    1. Iron deficiency anemia: She uses [facility] for labs. Her HGB 10.6 today which is baseline for her. Her iron stores are 187 today and Iron level are mildly low. Her last IV iron was in June 2017. She does not require feraheme today. She is okay with that. She will return to clinic in 6 months with repeat laboratory work and further evaluation.   Patient expressed understanding and was in agreement with this plan. She also understands that She can call clinic at any time with any questions, concerns, or complaints.   Durenda HurtJennifer Burns, NP 02/12/2016 10:50 AM  Patient was seen and evaluated independently and I agree with the assessment and plan as dictated above.  Jeralyn Ruthsimothy J Finnegan, MD 02/12/16 10:02 PM

## 2016-03-01 ENCOUNTER — Other Ambulatory Visit: Payer: Self-pay | Admitting: Family Medicine

## 2016-03-01 DIAGNOSIS — I10 Essential (primary) hypertension: Secondary | ICD-10-CM

## 2016-05-13 DIAGNOSIS — I10 Essential (primary) hypertension: Secondary | ICD-10-CM | POA: Diagnosis not present

## 2016-05-13 DIAGNOSIS — M722 Plantar fascial fibromatosis: Secondary | ICD-10-CM | POA: Diagnosis not present

## 2016-05-30 DIAGNOSIS — M79671 Pain in right foot: Secondary | ICD-10-CM | POA: Diagnosis not present

## 2016-05-30 DIAGNOSIS — M19071 Primary osteoarthritis, right ankle and foot: Secondary | ICD-10-CM | POA: Diagnosis not present

## 2016-05-30 DIAGNOSIS — M25571 Pain in right ankle and joints of right foot: Secondary | ICD-10-CM | POA: Diagnosis not present

## 2016-06-17 ENCOUNTER — Ambulatory Visit (INDEPENDENT_AMBULATORY_CARE_PROVIDER_SITE_OTHER): Payer: 59 | Admitting: Family Medicine

## 2016-06-17 VITALS — BP 124/68 | HR 85 | Temp 97.6°F | Resp 16 | Ht 62.0 in | Wt 304.3 lb

## 2016-06-17 DIAGNOSIS — J4520 Mild intermittent asthma, uncomplicated: Secondary | ICD-10-CM | POA: Diagnosis not present

## 2016-06-17 DIAGNOSIS — I6789 Other cerebrovascular disease: Secondary | ICD-10-CM

## 2016-06-17 DIAGNOSIS — M722 Plantar fascial fibromatosis: Secondary | ICD-10-CM

## 2016-06-17 DIAGNOSIS — M19071 Primary osteoarthritis, right ankle and foot: Secondary | ICD-10-CM | POA: Diagnosis not present

## 2016-06-17 DIAGNOSIS — G40909 Epilepsy, unspecified, not intractable, without status epilepticus: Secondary | ICD-10-CM

## 2016-06-17 DIAGNOSIS — E559 Vitamin D deficiency, unspecified: Secondary | ICD-10-CM | POA: Diagnosis not present

## 2016-06-17 DIAGNOSIS — G4733 Obstructive sleep apnea (adult) (pediatric): Secondary | ICD-10-CM

## 2016-06-17 DIAGNOSIS — M25571 Pain in right ankle and joints of right foot: Secondary | ICD-10-CM

## 2016-06-17 DIAGNOSIS — I10 Essential (primary) hypertension: Secondary | ICD-10-CM

## 2016-06-17 MED ORDER — VITAMIN D (ERGOCALCIFEROL) 1.25 MG (50000 UNIT) PO CAPS: ORAL_CAPSULE | ORAL | 1 refills | Status: DC

## 2016-06-17 MED ORDER — AMLODIPINE BESYLATE 2.5 MG PO TABS: 2.5000 mg | ORAL_TABLET | Freq: Every day | ORAL | 1 refills | Status: DC

## 2016-06-17 MED ORDER — LOSARTAN POTASSIUM-HCTZ 100-12.5 MG PO TABS: 1.0000 | ORAL_TABLET | Freq: Every day | ORAL | 1 refills | Status: DC

## 2016-06-17 NOTE — Progress Notes (Signed)
Name: Paula [name]   MRN: [id number]    DOB: May 19, 1960   Date:06/17/2016       Progress Note  Subjective  Chief Complaint  Chief Complaint  Patient presents with  . Hypertension    6 month follow up  . Obesity  . Sleep Apnea  . Rash    itchy rash on arms and back of her neck    HPI  HTN: taking medication, and denies side effects, and is compliant with Losatan/hctz and amlodipine. BP has been at goal during her visits. No chest pain, SOB or palpitation.   Right ankle and foot pain: she has seen Dr. Ether Griffins, and had right foot steroid injection and is doing better, she will have insoles also.   Seizure disorder: she is driving again since June 2016, she has been seizure free over the past four years. She is compliant with medications, and denies side effects Continue follow up at [facility]- Dr. Alessandra Bevels  Asthma : mild intermittently, she has been doing well now, no wheezing or SOB , she only uses inhaler prn, and has not used in the past 6 months. No nocturnal symptoms  OSA: used to wear CPAP but had lost weight and was off CPAP,  she is not sure if she snores, waking up feeling rested, she states Dr. Alessandra Bevels sent her for another sleep study and she does not need CPAP at this time  Allergic Rhinitis: she is only taking medication prn, she denies rhinorrhea or nasal congestion at this time  Obesity: she lost a few pounds since last visit. Sshe joined a gym one year ago,  but has not gone to the gym yet. She will try to stop drinking sodas.   Anemia: post-menopausal now, she sees hematologist, Dr. Orlie Dakin    Vitamin D deficiency: needs to take Vitamin D rx as prescribed  Patient Active Problem List   Diagnosis Date Noted  . 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 Plantar fasciitis, right 06/17/2016  . Osteoarthritis of right foot 06/17/2016  . Primary osteoarthritis, right ankle and foot 09/13/2015  . Carpal tunnel syndrome, right 09/13/2015  . Tarsal tunnel  syndrome of right side 09/13/2015  . 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 Asthma, mild intermittent 08/15/2014  . Benign hypertension 08/15/2014  . Herpes genitalis in women 08/15/2014  . Allergic rhinitis 08/15/2014  . Cerebral seizure 08/15/2014  . Vitamin D deficiency 08/15/2014  . Morbid obesity (HCC) 08/03/2014  . Adhesive capsulitis of left shoulder 06/16/2013  . Obstructive sleep apnea of adult 06/16/2013  . Epilepsy (HCC) 06/16/2013  . Intractable epilepsy (HCC) 06/16/2013  . Anemia, iron deficiency 09/30/2006    Past Surgical History:  Procedure Laterality Date  . BREAST SURGERY Left    benign lump at age 7    Family History  Problem Relation Age of Onset  . Cancer Mother     Breast  . Hypertension Mother   . Diabetes Mother   . Hyperlipidemia Mother     Social History   Social History  . Marital status: Single    Spouse name: N/A  . Number of children: N/A  . Years of education: N/A   Occupational History  . Not on file.   Social History Main Topics  . Smoking status: Never Smoker  . Smokeless tobacco: Not on file  . Alcohol use No  . Drug use: No  . Sexual activity: Not Currently   Other Topics  Concern  . Not on file   Social History Narrative  . No narrative on file     Current Outpatient Prescriptions:  .  meloxicam (MOBIC) 15 MG tablet, Take by mouth., Disp: , Rfl:  .  albuterol (PROVENTIL HFA;VENTOLIN HFA) 108 (90 BASE) MCG/ACT inhaler, Inhale 2 puffs into the lungs every 6 (six) hours as needed., Disp: , Rfl:  .  amLODipine (NORVASC) 2.5 MG tablet, Take 1 tablet (2.5 mg total) by mouth daily., Disp: 90 tablet, Rfl: 1 .  carbamazepine (TEGRETOL) 200 MG tablet, TEGRETOL,  (Oral Tablet)  one a half m and afternoon and 2 and half qhs for 0 days  Quantity: 1.00;  Refills: 0   Ordered :26-Jan-2010  Alba Cory MD;  Started 14-Mar-2008 Active, Disp: , Rfl:  .  fluticasone (FLONASE) 50  MCG/ACT nasal spray, Place 2 sprays into both nostrils daily., Disp: 48 g, Rfl: 1 .  levETIRAcetam (KEPPRA) 500 MG tablet, Take 3 tablets by mouth 2 (two) times daily., Disp: , Rfl:  .  losartan-hydrochlorothiazide (HYZAAR) 100-12.5 MG tablet, Take 1 tablet by mouth daily., Disp: 90 tablet, Rfl: 1 .  topiramate (TOPAMAX) 100 MG tablet, Take 3 tablets by mouth 3 (three) times daily., Disp: , Rfl:  .  Vitamin D, Ergocalciferol, (DRISDOL) 50000 units CAPS capsule, Take 1 capsule by mouth  every 7 days, Disp: 12 capsule, Rfl: 1  Allergies  Allergen Reactions  . Ace Inhibitors      ROS  Constitutional: Negative for fever or significant weight change.  Respiratory: Negative for cough and shortness of breath.   Cardiovascular: Negative for chest pain or palpitations.  Gastrointestinal: Negative for abdominal pain, no bowel changes.  Musculoskeletal: Negative for gait problem or joint swelling.  Skin: Negative for rash.  Neurological: Negative for dizziness or headache.  No other specific complaints in a complete review of systems (except as listed in HPI above).  Objective  Vitals:   06/17/16 0912  BP: 124/68  Pulse: 85  Resp: 16  Temp: 97.6 F (36.4 C)  SpO2: 99%  Weight: (!) 304 lb 5 oz (138 kg)  Height:  (1.575 m)    Body mass index is 55.66 kg/m.  Physical Exam  Constitutional: Patient appears well-developed and well-nourished. Obese.  No distress.  HEENT: head atraumatic, normocephalic, pupils equal and reactive to light, neck supple, throat within normal limits Cardiovascular: Normal rate, regular rhythm and normal heart sounds.  No murmur heard. No BLE edema. Pulmonary/Chest: Effort normal and breath sounds normal. No respiratory distress. Abdominal: Soft.  There is no tenderness. Psychiatric: Patient has a normal mood and affect. behavior is normal. Judgment and thought content normal.   PHQ2/9: Depression screen [facility] 2/9 06/17/2016 12/18/2015 09/13/2015 08/15/2014   Decreased Interest 0 0 0 0  Down, Depressed, Hopeless 0 0 0 0  PHQ - 2 Score 0 0 0 0     Fall Risk: Fall Risk  06/17/2016 12/18/2015 09/13/2015 08/15/2014  Falls in the past year? No No No No     Functional Status Survey: Is the patient deaf or have difficulty hearing?: No Does the patient have difficulty seeing, even when wearing glasses/contacts?: No Does the patient have difficulty concentrating, remembering, or making decisions?: No Does the patient have difficulty walking or climbing stairs?: Yes Does the patient have difficulty dressing or bathing?: No Does the patient have difficulty doing errands alone such as visiting a doctor's office or shopping?: No    Assessment & Plan  1. Benign hypertension  Well controlled - losartan-hydrochlorothiazide (HYZAAR) 100-12.5 MG tablet; Take 1 tablet by mouth daily.  Dispense: 90 tablet; Refill: 1 - amLODipine (NORVASC) 2.5 MG tablet; Take 1 tablet (2.5 mg total) by mouth daily.  Dispense: 90 tablet; Refill: 1  2. Sinus tarsi syndrome of right foot  Seeing Dr. Ether Griffins  3. Plantar fasciitis, right  She will get insoles from Dr. Ether Griffins  4. Primary osteoarthritis of right foot  Continue follow up with Dr. Ether Griffins  5. Obstructive sleep apnea of adult  She had it done by neurologist at [facility], and states not a candidate for CPAP at this time  6. Cerebral seizure  Doing well, sees Dr. Alessandra Bevels, no episodes in the past 3 years  7. Morbid obesity, unspecified obesity type [facility])  Discussed with the patient the risk posed by an increased BMI. Discussed importance of portion control, calorie counting and at least 150 minutes of physical activity weekly. Avoid sweet beverages and drink more water. Eat at least 6 servings of fruit and vegetables daily  She states she will try to cut down on sodas , go from 3 to one can a day.   8. Mild intermittent asthma without complication  She is doing well at this time  9. Vitamin D deficiency  -  Vitamin D, Ergocalciferol, (DRISDOL) 50000 units CAPS capsule; Take 1 capsule by mouth  every 7 days  Dispense: 12 capsule; Refill: 1

## 2016-06-21 ENCOUNTER — Other Ambulatory Visit: Payer: Self-pay | Admitting: Family Medicine

## 2016-06-21 DIAGNOSIS — Z1231 Encounter for screening mammogram for malignant neoplasm of breast: Secondary | ICD-10-CM

## 2016-07-15 ENCOUNTER — Other Ambulatory Visit: Payer: Self-pay | Admitting: Family Medicine

## 2016-07-15 ENCOUNTER — Ambulatory Visit
Admission: RE | Admit: 2016-07-15 | Discharge: 2016-07-15 | Disposition: A | Discharge status: Home / Self Care | Payer: 59 | Source: Ambulatory Visit | Attending: Family Medicine | Admitting: Family Medicine

## 2016-07-15 DIAGNOSIS — Z1231 Encounter for screening mammogram for malignant neoplasm of breast: Secondary | ICD-10-CM | POA: Diagnosis not present

## 2016-08-07 DIAGNOSIS — Z7689 Persons encountering health services in other specified circumstances: Secondary | ICD-10-CM | POA: Diagnosis not present

## 2016-08-12 ENCOUNTER — Inpatient Hospital Stay: Payer: 59

## 2016-08-12 ENCOUNTER — Inpatient Hospital Stay: Payer: 59 | Attending: Oncology | Admitting: Oncology

## 2016-08-12 VITALS — BP 112/73 | HR 87 | Temp 99.6°F

## 2016-08-12 DIAGNOSIS — Z803 Family history of malignant neoplasm of breast: Secondary | ICD-10-CM | POA: Insufficient documentation

## 2016-08-12 DIAGNOSIS — R531 Weakness: Secondary | ICD-10-CM | POA: Diagnosis not present

## 2016-08-12 DIAGNOSIS — G40909 Epilepsy, unspecified, not intractable, without status epilepticus: Secondary | ICD-10-CM | POA: Insufficient documentation

## 2016-08-12 DIAGNOSIS — D509 Iron deficiency anemia, unspecified: Secondary | ICD-10-CM | POA: Insufficient documentation

## 2016-08-12 DIAGNOSIS — R5383 Other fatigue: Secondary | ICD-10-CM | POA: Diagnosis not present

## 2016-08-12 DIAGNOSIS — Z79899 Other long term (current) drug therapy: Secondary | ICD-10-CM | POA: Diagnosis not present

## 2016-08-12 DIAGNOSIS — I10 Essential (primary) hypertension: Secondary | ICD-10-CM | POA: Diagnosis not present

## 2016-08-12 DIAGNOSIS — G4730 Sleep apnea, unspecified: Secondary | ICD-10-CM | POA: Diagnosis not present

## 2016-08-12 NOTE — Progress Notes (Signed)
[facility]  Telephone:(336) [phone] Fax:(336) [phone]  ID: Paula [name] OB: 1961/02/22  MR#: [id number]  UXL#:[id number]#:[id number]  Patient Care Team: Alba CorySowles, Krichna, MD as PCP - General (Family Medicine) Gwyneth RevelsFowler, Justin, DPM as Consulting Physician (Podiatry) Bonnee QuinVaughn, Bradley V, MD as Consulting Physician (Neurology) Alwyn Peaallwood, Dwayne D, MD as Consulting Physician (Cardiology)  CHIEF COMPLAINT: Iron deficiency anemia.  INTERVAL HISTORY: Patient returns to clinic today for repeat laboratory work and clinical evaluation. She has chronic weakness and fatigue. Despite this, she continues to be active and work full-time. She has no neurologic complaints. She denies fever or chills. She denies any weight loss or weight gain. She denies any current chest pain or shortness of breath.  She denies any nausea, vomiting, constipation, or diarrhea. She has no urinary complaints. Patient offers no specific complaints today.  REVIEW OF SYSTEMS:   Review of Systems  Constitutional: Positive for malaise/fatigue. Negative for fever and weight loss.  Respiratory: Negative for cough and shortness of breath.   Cardiovascular: Negative.  Negative for chest pain and leg swelling.  Gastrointestinal: Negative.  Negative for abdominal pain, blood in stool and melena.  Genitourinary: Negative.   Musculoskeletal: Negative.   Skin: Negative.  Negative for rash.  Neurological: Positive for weakness.  Psychiatric/Behavioral: Negative.  The patient is not nervous/anxious.     As per HPI. Otherwise, a complete review of systems is negative.  PAST MEDICAL HISTORY: Anemia, hypertension, seizure disorder, sleep apnea, hypertension, left breast lumpectomy with Dr. Lemar LivingsByrnett. Colonoscopy in 2004.  PAST SURGICAL HISTORY: Past Surgical History:  Procedure Laterality Date  . BREAST BIOPSY Left    age 56 tumor removed  . BREAST SURGERY Left    benign lump at age 56    FAMILY HISTORY Family History    Problem Relation Age of Onset  . Cancer Mother        Breast  . Hypertension Mother   . Diabetes Mother   . Hyperlipidemia Mother   . Breast cancer Mother        ADVANCED DIRECTIVES:    HEALTH MAINTENANCE: Social History  Substance Use Topics  . Smoking status: Never Smoker  . Smokeless tobacco: Not on file  . Alcohol use No     Allergies  Allergen Reactions  . Ace Inhibitors     Current Outpatient Prescriptions  Medication Sig Dispense Refill  . albuterol (PROVENTIL HFA;VENTOLIN HFA) 108 (90 BASE) MCG/ACT inhaler Inhale 2 puffs into the lungs every 6 (six) hours as needed.    Marland Kitchen. amLODipine (NORVASC) 2.5 MG tablet Take 1 tablet (2.5 mg total) by mouth daily. 90 tablet 1  . carbamazepine (TEGRETOL) 200 MG tablet TEGRETOL, 200MG  (Oral Tablet)  one a half m and afternoon and 2 and half qhs for 0 days  Quantity: 1.00;  Refills: 0   Ordered :26-Jan-2010  Alba CorySOWLES, KRICHNA MD;  Mora ApplStarted 14-Mar-2008 Active    . fluticasone (FLONASE) 50 MCG/ACT nasal spray Place 2 sprays into both nostrils daily. 48 g 1  . levETIRAcetam (KEPPRA) 500 MG tablet Take 3 tablets by mouth 2 (two) times daily.    Marland Kitchen. losartan-hydrochlorothiazide (HYZAAR) 100-12.5 MG tablet Take 1 tablet by mouth daily. 90 tablet 1  . meloxicam (MOBIC) 15 MG tablet   0  . topiramate (TOPAMAX) 100 MG tablet Take 3 tablets by mouth 3 (three) times daily.    . Vitamin D, Ergocalciferol, (DRISDOL) 50000 units CAPS capsule Take 1 capsule by mouth  every 7 days 12 capsule 1   No  current facility-administered medications for this visit.     OBJECTIVE: Vitals:   08/12/16 1401  BP: 112/73  Pulse: 87  Temp: 99.6 F (37.6 C)     There is no height or weight on file to calculate BMI.    ECOG FS:0 - Asymptomatic  General: Well-developed, well-nourished, no acute distress. Eyes: Pink conjunctiva, anicteric sclera. Lungs: Clear to auscultation bilaterally. Heart: Regular rate and rhythm. No rubs, murmurs, or gallops. Abdomen:  Soft, nontender, nondistended. No organomegaly noted, normoactive bowel sounds. Musculoskeletal: No edema, cyanosis, or clubbing. Neuro: Alert, answering all questions appropriately. Cranial nerves grossly intact. Skin: No rashes or petechiae noted. Psych: Normal affect.   LAB RESULTS:  Lab Results  Component Value Date   NA 140 09/18/2015   K 4.2 09/18/2015   CL 102 09/18/2015   CO2 24 09/18/2015   GLUCOSE 89 09/18/2015   BUN 13 09/18/2015   CREATININE 0.80 09/18/2015   CALCIUM 9.1 09/18/2015   PROT 6.9 09/18/2015   ALBUMIN 4.0 09/18/2015   AST 11 09/18/2015   ALT 13 09/18/2015   ALKPHOS 124 (H) 09/18/2015   BILITOT <0.2 09/18/2015   GFRNONAA 84 09/18/2015   GFRAA 97 09/18/2015    Lab Results  Component Value Date   WBC 3.8 08/14/2015   NEUTROABS 2.0 08/14/2015   HGB 10.1 (L) 08/14/2015   HCT 30.9 (L) 08/14/2015   MCV 82.3 08/14/2015   PLT 220 08/14/2015     STUDIES: Mm Screening Breast Tomo Bilateral  Result Date: 07/15/2016 CLINICAL DATA:  Screening. EXAM: 2D DIGITAL SCREENING BILATERAL MAMMOGRAM WITH CAD AND ADJUNCT TOMO COMPARISON:  Previous exam(s). ACR Breast Density Category b: There are scattered areas of fibroglandular density. FINDINGS: There are no findings suspicious for malignancy. Images were processed with CAD. IMPRESSION: No mammographic evidence of malignancy. A result letter of this screening mammogram will be mailed directly to the patient. RECOMMENDATION: Screening mammogram in one year. (Code:SM-B-01Y) BI-RADS CATEGORY  1: Negative. Electronically Signed   By: Dalphine Handing M.D.   On: 07/15/2016 13:22    ASSESSMENT: Iron deficiency anemia.  PLAN:    1. Iron deficiency anemia: She uses [facility] for labs which are pending at time of dictation. Previously, her hemoglobin was greater than 10.0 which is her baseline. Iron stores continue to be within normal limits. Her last IV iron was in June 2017. She does not require feraheme today. Return to clinic in  6 months with repeat laboratory work and further evaluation.   Approximately 20 minutes was spent in discussion of which greater than 50% was consultation.  Patient expressed understanding and was in agreement with this plan. She also understands that She can call clinic at any time with any questions, concerns, or complaints.    Jeralyn Ruths, MD 08/12/16 2:14 PM

## 2016-08-12 NOTE — Progress Notes (Signed)
Patient here today for follow up.   

## 2016-09-24 DIAGNOSIS — M19071 Primary osteoarthritis, right ankle and foot: Secondary | ICD-10-CM | POA: Diagnosis not present

## 2016-09-24 DIAGNOSIS — M76822 Posterior tibial tendinitis, left leg: Secondary | ICD-10-CM | POA: Diagnosis not present

## 2016-09-24 DIAGNOSIS — M76821 Posterior tibial tendinitis, right leg: Secondary | ICD-10-CM | POA: Diagnosis not present

## 2016-09-24 DIAGNOSIS — B351 Tinea unguium: Secondary | ICD-10-CM | POA: Diagnosis not present

## 2016-10-08 ENCOUNTER — Encounter: Payer: Self-pay | Admitting: Family Medicine

## 2016-10-08 ENCOUNTER — Ambulatory Visit (INDEPENDENT_AMBULATORY_CARE_PROVIDER_SITE_OTHER): Payer: 59 | Admitting: Family Medicine

## 2016-10-08 VITALS — BP 130/82 | HR 95 | Temp 97.8°F | Resp 16 | Ht 62.0 in | Wt 303.0 lb

## 2016-10-08 DIAGNOSIS — L299 Pruritus, unspecified: Secondary | ICD-10-CM

## 2016-10-08 DIAGNOSIS — R21 Rash and other nonspecific skin eruption: Secondary | ICD-10-CM | POA: Diagnosis not present

## 2016-10-08 MED ORDER — CETIRIZINE HCL 10 MG PO TABS: 10.0000 mg | ORAL_TABLET | Freq: Every day | ORAL | 1 refills | Status: DC

## 2016-10-08 MED ORDER — TRIAMCINOLONE ACETONIDE 0.1 % EX OINT: 1.0000 "application " | TOPICAL_OINTMENT | Freq: Two times a day (BID) | CUTANEOUS | 1 refills | Status: DC

## 2016-10-08 NOTE — Patient Instructions (Addendum)
Pruritus Pruritus is an itching feeling. There are many different conditions and factors that can make your skin itchy. Dry skin is one of the most common causes of itching. Most cases of itching do not require medical attention. Itchy skin can turn into a rash. Follow these instructions at home: Watch your pruritus for any changes. Take these steps to help with your condition: Skin Care  Moisturize your skin as needed. A moisturizer that contains petroleum jelly is best for keeping moisture in your skin.  Take or apply medicines only as directed by your health care provider. This may include: ? Corticosteroid cream. ? Anti-itch lotions. ? Oral anti-histamines.  Apply cool compresses to the affected areas.  Try taking a bath with: ? Epsom salts. Follow the instructions on the packaging. You can get these at your local pharmacy or grocery store. ? Baking soda. Pour a small amount into the bath as directed by your health care provider. ? Colloidal oatmeal. Follow the instructions on the packaging. You can get this at your local pharmacy or grocery store.  Try applying baking soda paste to your skin. Stir water into baking soda until it reaches a paste-like consistency.  Do not scratch your skin.  Avoid hot showers or baths, which can make itching worse. A cold shower may help with itching as long as you use a moisturizer after.  Avoid scented soaps, detergents, and perfumes. Use gentle soaps, detergents, perfumes, and other cosmetic products. General instructions  Avoid wearing tight clothes.  Keep a journal to help track what causes your itch. Write down: ? What you eat. ? What cosmetic products you use. ? What you drink. ? What you wear. This includes jewelry.  Use a humidifier. This keeps the air moist, which helps to prevent dry skin. Contact a health care provider if:  The itching does not go away after several days.  You sweat at night.  You have weight loss.  You  are unusually thirsty.  You urinate more than normal.  You are more tired than normal.  You have abdominal pain.  Your skin tingles.  You feel weak.  Your skin or the whites of your eyes look yellow (jaundice).  Your skin feels numb. This information is not intended to replace advice given to you by your health care provider. Make sure you discuss any questions you have with your health care provider. Document Released: 11/07/2010 Document Revised: 08/03/2015 Document Reviewed: 02/21/2014 Elsevier Interactive Patient Education  2018 [employer]  Rash A rash is a change in the color of the skin. A rash can also change the way your skin feels. There are many different conditions and factors that can cause a rash. Follow these instructions at home: Pay attention to any changes in your symptoms. Follow these instructions to help with your condition: Medicine Take or apply over-the-counter and prescription medicines only as told by your doctor. These may include:  Corticosteroid cream.  Anti-itch lotions.  Oral antihistamines.  Skin Care  Put cool compresses on the affected areas.  Try taking a bath with: ? Epsom salts. Follow the instructions on the packaging. You can get these at your local pharmacy or grocery store. ? Baking soda. Pour a small amount into the bath as told by your doctor. ? Colloidal oatmeal. Follow the instructions on the packaging. You can get this at your local pharmacy or grocery store.  Try putting baking soda paste onto your skin. Stir water into baking soda until it gets like  a paste.  Do not scratch or rub your skin.  Avoid covering the rash. Make sure the rash is exposed to air as much as possible. General instructions  Avoid hot showers or baths, which can make itching worse. A cold shower may help.  Avoid scented soaps, detergents, and perfumes. Use gentle soaps, detergents, perfumes, and other cosmetic products.  Avoid anything that  causes your rash. Keep a journal to help track what causes your rash. Write down: ? What you eat. ? What cosmetic products you use. ? What you drink. ? What you wear. This includes jewelry.  Keep all follow-up visits as told by your doctor. This is important. Contact a doctor if:  You sweat at night.  You lose weight.  You pee (urinate) more than normal.  You feel weak.  You throw up (vomit).  Your skin or the whites of your eyes look yellow (jaundice).  Your skin: ? Tingles. ? Is numb.  Your rash: ? Does not go away after a few days. ? Gets worse.  You are: ? More thirsty than normal. ? More tired than normal.  You have: ? New symptoms. ? Pain in your belly (abdomen). ? A fever. ? Watery poop (diarrhea). Get help right away if:  Your rash covers all or most of your body. The rash may or may not be painful.  You have blisters that: ? Are on top of the rash. ? Grow larger. ? Grow together. ? Are painful. ? Are inside your nose or mouth.  You have a rash that: ? Looks like purple pinprick-sized spots all over your body. ? Has a "bull's eye" or looks like a target. ? Is red and painful, causes your skin to peel, and is not from being in the sun too long. This information is not intended to replace advice given to you by your health care provider. Make sure you discuss any questions you have with your health care provider. Document Released: 08/14/2007 Document Revised: 08/03/2015 Document Reviewed: 07/13/2014 Elsevier Interactive Patient Education  2018 [employer]

## 2016-10-08 NOTE — Progress Notes (Addendum)
Name: Paula [name]   MRN: [id number]    DOB: January 28, 1961   Date:10/08/2016       Progress Note  Subjective  Chief Complaint  Chief Complaint  Patient presents with  . Rash    all over body    HPI  Pt presents with c/o rash on BUE and chest for several months; describes rash as very itchy but no pain, bleeding, or discharge. She has not tried any new foods recently, no recent medication changes, no new products or detergents.  She does note that she thinks she may have bed bugs In her home and her itching is worse at night and when she is very hot/sweating. She has not initiated any bed bug inspection/eradication efforts but plans to do so this week.  She has not done anything to help the rash yet aside from applying a cool pack/[facility] both of which seems to relieve the itching. She denies fatigue/malaise, seeing any insects on her skin/in her home, no nausea/vomiting/diarrhea, no body aches.  Patient Active Problem List   Diagnosis Date Noted  . 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 Plantar fasciitis, right 06/17/2016  . Osteoarthritis of right foot 06/17/2016  . Primary osteoarthritis, right ankle and foot 09/13/2015  . Carpal tunnel syndrome, right 09/13/2015  . Tarsal tunnel syndrome of right side 09/13/2015  . 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 Asthma, mild intermittent 08/15/2014  . Benign hypertension 08/15/2014  . Herpes genitalis in women 08/15/2014  . Allergic rhinitis 08/15/2014  . Cerebral seizure 08/15/2014  . Vitamin D deficiency 08/15/2014  . Morbid obesity (HCC) 08/03/2014  . Adhesive capsulitis of left shoulder 06/16/2013  . Obstructive sleep apnea of adult 06/16/2013  . Epilepsy (HCC) 06/16/2013  . Intractable epilepsy (HCC) 06/16/2013  . Anemia, iron deficiency 09/30/2006    Social History  Substance Use Topics  . Smoking status: Never Smoker  . Smokeless tobacco: Never Used  .  Alcohol use No     Current Outpatient Prescriptions:  .  albuterol (PROVENTIL HFA;VENTOLIN HFA) 108 (90 BASE) MCG/ACT inhaler, Inhale 2 puffs into the lungs every 6 (six) hours as needed., Disp: , Rfl:  .  amLODipine (NORVASC) 2.5 MG tablet, Take 1 tablet (2.5 mg total) by mouth daily., Disp: 90 tablet, Rfl: 1 .  carbamazepine (TEGRETOL) 200 MG tablet, TEGRETOL, 200MG  (Oral Tablet)  one a half m and afternoon and 2 and half qhs for 0 days  Quantity: 1.00;  Refills: 0   Ordered :26-Jan-2010  Paula CorySOWLES, KRICHNA MD;  Started 14-Mar-2008 Active, Disp: , Rfl:  .  fluticasone (FLONASE) 50 MCG/ACT nasal spray, Place 2 sprays into both nostrils daily., Disp: 48 g, Rfl: 1 .  levETIRAcetam (KEPPRA) 500 MG tablet, Take 3 tablets by mouth 2 (two) times daily., Disp: , Rfl:  .  losartan-hydrochlorothiazide (HYZAAR) 100-12.5 MG tablet, Take 1 tablet by mouth daily., Disp: 90 tablet, Rfl: 1 .  meloxicam (MOBIC) 15 MG tablet, , Disp: , Rfl: 0 .  topiramate (TOPAMAX) 100 MG tablet, Take 3 tablets by mouth 3 (three) times daily., Disp: , Rfl:  .  Vitamin D, Ergocalciferol, (DRISDOL) 50000 units CAPS capsule, Take 1 capsule by mouth  every 7 days, Disp: 12 capsule, Rfl: 1  Allergies  Allergen Reactions  . Ace Inhibitors     ROS Constitutional: Negative for fever or weight change.  Respiratory: Negative for cough and shortness of breath.   Cardiovascular: Negative  for chest pain or palpitations.  Gastrointestinal: Negative for abdominal pain, no bowel changes.  Musculoskeletal: Negative for gait problem or joint swelling.  Skin: Positive for rash and pruritis.  Neurological: Negative for dizziness or headache.  No other specific complaints in a complete review of systems (except as listed in HPI above).  Objective  Vitals:   10/08/16 0839  BP: 130/82  Pulse: 95  Resp: 16  Temp: 97.8 F (36.6 C)  TempSrc: Oral  SpO2: 99%  Weight: (!) 303 lb (137.4 kg)  Height: 5\' 2"  (1.575 m)   Body mass index is  55.42 kg/m.  Nursing Note and Vital Signs reviewed.  Physical Exam  Constitutional: Patient appears well-developed and well-nourished. Obese No distress.  HEENT: head atraumatic, normocephalic Cardiovascular: Normal rate, regular rhythm, S1/S2 present.  No murmur or rub heard. No BLE edema. Pulmonary/Chest: Effort normal and breath sounds clear. No respiratory distress or retractions. Psychiatric: Patient has a normal mood and affect. behavior is normal. Judgment and thought content normal. Skin: Maculopapular rash to BUE and chest - intermittent areas of rash present, it is not diffuse on these areas. Minimal erythema, no bleeding, excoriation, or discharge on areas.  No rash present on flexor surfaces or interdigital folds.  No results found for this or any previous visit (from the past 2160 hour(s)).   Assessment & Plan 1. Rash and nonspecific skin eruption - triamcinolone ointment (KENALOG) 0.1 %; Apply 1 application topically 2 (two) times daily.  Dispense: 30 g; Refill: 1  2. Pruritus - cetirizine (ZYRTEC) 10 MG tablet; Take 1 tablet (10 mg total) by mouth daily.  Dispense: 30 tablet; Refill: 1 - Apply cold packs and/or sit in cool area. - Discussed using Vistaril for itching at night and patient declines, wants to try Zyrtec first.  -Red flags and when to present for emergency care or RTC including fever >101.77F, malaise/fatigue, bleeding/exudate, new/worsening/un-resolving symptoms reviewed with patient at time of visit. Follow up and care instructions discussed and provided in AVS.  I have reviewed this encounter including the documentation in this note and/or discussed this patient with the Deboraha Sprangprovider,Nakota Ackert, FNP, NP-C. I am certifying that I agree with the content of this note as supervising physician.  Paula CoryKrichna Sowles, MD [facility] 10/09/2016, 9:38 PM

## 2016-10-14 ENCOUNTER — Encounter: Payer: Self-pay | Admitting: Family Medicine

## 2016-10-14 ENCOUNTER — Ambulatory Visit (INDEPENDENT_AMBULATORY_CARE_PROVIDER_SITE_OTHER): Payer: 59 | Admitting: Family Medicine

## 2016-10-14 VITALS — BP 128/82 | HR 78 | Temp 98.3°F | Resp 16 | Ht 62.0 in | Wt 299.3 lb

## 2016-10-14 DIAGNOSIS — R21 Rash and other nonspecific skin eruption: Secondary | ICD-10-CM

## 2016-10-14 NOTE — Progress Notes (Addendum)
Name: Paula [name]   MRN: [id number]    DOB: 23-Jun-1960   Date:10/14/2016       Progress Note  Subjective  Chief Complaint  Chief Complaint  Patient presents with  . Follow-up    6 days recheck rash    HPI  Patient presents to follow up on rash. She has been significantly less itchy since using Kenalog ointment and PO Zyrtec and she denies pain, fevers/chills, bleeding/drainage.  She has noticed that the rash and itching worsen only when she is hot, but overall the rash has greatly improved. She still thinks she has bed bugs in her home, but has not done anything about it yet; says plans to call someone this week or next.  Patient Active Problem List   Diagnosis Date Noted  . 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 Plantar fasciitis, right 06/17/2016  . Osteoarthritis of right foot 06/17/2016  . Primary osteoarthritis, right ankle and foot 09/13/2015  . Carpal tunnel syndrome, right 09/13/2015  . Tarsal tunnel syndrome of right side 09/13/2015  . 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 Asthma, mild intermittent 08/15/2014  . Benign hypertension 08/15/2014  . Herpes genitalis in women 08/15/2014  . Allergic rhinitis 08/15/2014  . Cerebral seizure 08/15/2014  . Vitamin D deficiency 08/15/2014  . Morbid obesity (HCC) 08/03/2014  . Adhesive capsulitis of left shoulder 06/16/2013  . Obstructive sleep apnea of adult 06/16/2013  . Epilepsy (HCC) 06/16/2013  . Intractable epilepsy (HCC) 06/16/2013  . Anemia, iron deficiency 09/30/2006    Social History  Substance Use Topics  . Smoking status: Never Smoker  . Smokeless tobacco: Never Used  . Alcohol use No     Current Outpatient Prescriptions:  .  albuterol (PROVENTIL HFA;VENTOLIN HFA) 108 (90 BASE) MCG/ACT inhaler, Inhale 2 puffs into the lungs every 6 (six) hours as needed., Disp: , Rfl:  .  amLODipine (NORVASC) 2.5 MG tablet, Take 1 tablet (2.5 mg total) by mouth  daily., Disp: 90 tablet, Rfl: 1 .  carbamazepine (TEGRETOL) 200 MG tablet, TEGRETOL, 200MG  (Oral Tablet)  one a half m and afternoon and 2 and half qhs for 0 days  Quantity: 1.00;  Refills: 0   Ordered :26-Jan-2010  Alba Cory MD;  Started 14-Mar-2008 Active, Disp: , Rfl:  .  cetirizine (ZYRTEC) 10 MG tablet, Take 1 tablet (10 mg total) by mouth daily., Disp: 30 tablet, Rfl: 1 .  fluticasone (FLONASE) 50 MCG/ACT nasal spray, Place 2 sprays into both nostrils daily., Disp: 48 g, Rfl: 1 .  levETIRAcetam (KEPPRA) 500 MG tablet, Take 3 tablets by mouth 2 (two) times daily., Disp: , Rfl:  .  losartan-hydrochlorothiazide (HYZAAR) 100-12.5 MG tablet, Take 1 tablet by mouth daily., Disp: 90 tablet, Rfl: 1 .  meloxicam (MOBIC) 15 MG tablet, , Disp: , Rfl: 0 .  topiramate (TOPAMAX) 100 MG tablet, Take 3 tablets by mouth 3 (three) times daily., Disp: , Rfl:  .  triamcinolone ointment (KENALOG) 0.1 %, Apply 1 application topically 2 (two) times daily., Disp: 30 g, Rfl: 1 .  Vitamin D, Ergocalciferol, (DRISDOL) 50000 units CAPS capsule, Take 1 capsule by mouth  every 7 days, Disp: 12 capsule, Rfl: 1  Allergies  Allergen Reactions  . Ace Inhibitors     ROS  Constitutional: Negative for fever; lost 4lbs since last visit (pt is trying to lose weight).  Respiratory: Negative for cough and shortness of breath.   Cardiovascular: Negative for chest  pain or palpitations.  Gastrointestinal: Negative for abdominal pain, no bowel changes.  Musculoskeletal: Negative for gait problem or joint swelling.  Skin: Positive for rash - still present on chest and BUE.  Neurological: Negative for dizziness or headache.  No other specific complaints in a complete review of systems (except as listed in HPI above).  Objective  Vitals:   10/14/16 0752  BP: 128/82  Pulse: 78  Resp: 16  Temp: 98.3 F (36.8 C)  TempSrc: Oral  SpO2: 99%  Weight: 299 lb 4.8 oz (135.8 kg)  Height: 5\' 2"  (1.575 m)   Body mass index  is 54.74 kg/m.  Nursing Note and Vital Signs reviewed.  Physical Exam Constitutional: Patient appears well-developed and well-nourished. Obese No distress.  HEENT: head atraumatic, normocephalic Cardiovascular: Normal rate, regular rhythm, S1/S2 present.  No murmur or rub heard. No BLE edema. Pulmonary/Chest: Effort normal and breath sounds clear. No respiratory distress or retractions. Psychiatric: Patient has a normal mood and affect. behavior is normal. Judgment and thought content normal. Skin: BUE and chest have very small number of isolated papular, mildly erythematous lesions. No bleeding/drainage or excoriation to this area.  No results found for this or any previous visit (from the past 2160 hour(s)).   Assessment & Plan  1. Rash and nonspecific skin eruption - Will call someone about bed bug removal this week or next. - Will call when she needs refills of Zyrtec and/or Kenalog ointment - Discussed signs and symptoms of Scabies and of infection - Keep cool when possible, apply cool packs to areas PRN for itch relief.  -Red flags and when to present for emergency care or RTC including fever >101.30F, increase in redness, drainage/bleeding, new/worsening/un-resolving symptoms, reviewed with patient at time of visit. Follow up and care instructions discussed and provided in AVS.  I have reviewed this encounter including the documentation in this note and/or discussed this patient with the Deboraha Sprangprovider,Emily Boyce, FNP, NP-C. I am certifying that I agree with the content of this note as supervising physician.  Alba CoryKrichna Sowles, MD [facility] 10/14/2016, 10:07 AM

## 2016-11-25 ENCOUNTER — Other Ambulatory Visit: Payer: Self-pay | Admitting: Family Medicine

## 2016-11-25 DIAGNOSIS — I10 Essential (primary) hypertension: Secondary | ICD-10-CM

## 2016-11-25 NOTE — Telephone Encounter (Signed)
Patient requesting refill of Amlodipine and Losartan-HCTZ to [facility].

## 2016-12-30 ENCOUNTER — Ambulatory Visit (INDEPENDENT_AMBULATORY_CARE_PROVIDER_SITE_OTHER): Payer: 59 | Admitting: Family Medicine

## 2016-12-30 ENCOUNTER — Encounter: Payer: Self-pay | Admitting: Family Medicine

## 2016-12-30 VITALS — BP 140/80 | HR 80 | Temp 98.0°F | Resp 14 | Ht 64.0 in | Wt 300.2 lb

## 2016-12-30 DIAGNOSIS — Z6841 Body Mass Index (BMI) 40.0 and over, adult: Secondary | ICD-10-CM | POA: Diagnosis not present

## 2016-12-30 DIAGNOSIS — J4521 Mild intermittent asthma with (acute) exacerbation: Secondary | ICD-10-CM | POA: Diagnosis not present

## 2016-12-30 DIAGNOSIS — G4733 Obstructive sleep apnea (adult) (pediatric): Secondary | ICD-10-CM

## 2016-12-30 DIAGNOSIS — I6789 Other cerebrovascular disease: Secondary | ICD-10-CM | POA: Diagnosis not present

## 2016-12-30 DIAGNOSIS — I10 Essential (primary) hypertension: Secondary | ICD-10-CM

## 2016-12-30 DIAGNOSIS — Z01419 Encounter for gynecological examination (general) (routine) without abnormal findings: Secondary | ICD-10-CM | POA: Diagnosis not present

## 2016-12-30 DIAGNOSIS — L309 Dermatitis, unspecified: Secondary | ICD-10-CM | POA: Diagnosis not present

## 2016-12-30 DIAGNOSIS — E559 Vitamin D deficiency, unspecified: Secondary | ICD-10-CM | POA: Diagnosis not present

## 2016-12-30 DIAGNOSIS — G40909 Epilepsy, unspecified, not intractable, without status epilepticus: Secondary | ICD-10-CM

## 2016-12-30 DIAGNOSIS — B002 Herpesviral gingivostomatitis and pharyngotonsillitis: Secondary | ICD-10-CM

## 2016-12-30 DIAGNOSIS — M19071 Primary osteoarthritis, right ankle and foot: Secondary | ICD-10-CM

## 2016-12-30 DIAGNOSIS — Z1239 Encounter for other screening for malignant neoplasm of breast: Secondary | ICD-10-CM

## 2016-12-30 MED ORDER — LOSARTAN POTASSIUM-HCTZ 100-12.5 MG PO TABS: 1.0000 | ORAL_TABLET | Freq: Every day | ORAL | 1 refills | Status: DC

## 2016-12-30 MED ORDER — AMLODIPINE BESYLATE 2.5 MG PO TABS: 2.5000 mg | ORAL_TABLET | Freq: Every day | ORAL | 1 refills | Status: DC

## 2016-12-30 MED ORDER — BUDESONIDE-FORMOTEROL FUMARATE 160-4.5 MCG/ACT IN AERO: 2.0000 | INHALATION_SPRAY | Freq: Two times a day (BID) | RESPIRATORY_TRACT | 0 refills | Status: DC

## 2016-12-30 MED ORDER — MELOXICAM 15 MG PO TABS: 15.0000 mg | ORAL_TABLET | Freq: Every day | ORAL | 0 refills | Status: DC

## 2016-12-30 MED ORDER — PIMECROLIMUS 1 % EX CREA
TOPICAL_CREAM | Freq: Two times a day (BID) | CUTANEOUS | 0 refills | Status: DC
Start: 2016-12-30 — End: 2017-03-17

## 2016-12-30 MED ORDER — ASPIRIN EC 81 MG PO TBEC: 81.0000 mg | DELAYED_RELEASE_TABLET | Freq: Every day | ORAL | 0 refills | Status: DC

## 2016-12-30 MED ORDER — VALACYCLOVIR HCL 1 G PO TABS: 1000.0000 mg | ORAL_TABLET | Freq: Two times a day (BID) | ORAL | 0 refills | Status: DC

## 2016-12-30 NOTE — Patient Instructions (Signed)
Preventive Care 40-64 Years, Female Preventive care refers to lifestyle choices and visits with your health care provider that can promote health and wellness. What does preventive care include?  A yearly physical exam. This is also called an annual well check.  Dental exams once or twice a year.  Routine eye exams. Ask your health care provider how often you should have your eyes checked.  Personal lifestyle choices, including: ? Daily care of your teeth and gums. ? Regular physical activity. ? Eating a healthy diet. ? Avoiding tobacco and drug use. ? Limiting alcohol use. ? Practicing safe sex. ? Taking low-dose aspirin daily starting at age 58. ? Taking vitamin and mineral supplements as recommended by your health care provider. What happens during an annual well check? The services and screenings done by your health care provider during your annual well check will depend on your age, overall health, lifestyle risk factors, and family history of disease. Counseling Your health care provider may ask you questions about your:  Alcohol use.  Tobacco use.  Drug use.  Emotional well-being.  Home and relationship well-being.  Sexual activity.  Eating habits.  Work and work Statistician.  Method of birth control.  Menstrual cycle.  Pregnancy history.  Screening You may have the following tests or measurements:  Height, weight, and BMI.  Blood pressure.  Lipid and cholesterol levels. These may be checked every 5 years, or more frequently if you are over 81 years old.  Skin check.  Lung cancer screening. You may have this screening every year starting at age 78 if you have a 30-pack-year history of smoking and currently smoke or have quit within the past 15 years.  Fecal occult blood test (FOBT) of the stool. You may have this test every year starting at age 65.  Flexible sigmoidoscopy or colonoscopy. You may have a sigmoidoscopy every 5 years or a colonoscopy  every 10 years starting at age 30.  Hepatitis C blood test.  Hepatitis B blood test.  Sexually transmitted disease (STD) testing.  Diabetes screening. This is done by checking your blood sugar (glucose) after you have not eaten for a while (fasting). You may have this done every 1-3 years.  Mammogram. This may be done every 1-2 years. Talk to your health care provider about when you should start having regular mammograms. This may depend on whether you have a family history of breast cancer.  BRCA-related cancer screening. This may be done if you have a family history of breast, ovarian, tubal, or peritoneal cancers.  Pelvic exam and Pap test. This may be done every 3 years starting at age 80. Starting at age 36, this may be done every 5 years if you have a Pap test in combination with an HPV test.  Bone density scan. This is done to screen for osteoporosis. You may have this scan if you are at high risk for osteoporosis.  Discuss your test results, treatment options, and if necessary, the need for more tests with your health care provider. Vaccines Your health care provider may recommend certain vaccines, such as:  Influenza vaccine. This is recommended every year.  Tetanus, diphtheria, and acellular pertussis (Tdap, Td) vaccine. You may need a Td booster every 10 years.  Varicella vaccine. You may need this if you have not been vaccinated.  Zoster vaccine. You may need this after age 5.  Measles, mumps, and rubella (MMR) vaccine. You may need at least one dose of MMR if you were born in  1955 or later. You may also need a second dose.  Pneumococcal 13-valent conjugate (PCV13) vaccine. You may need this if you have certain conditions and were not previously vaccinated.  Pneumococcal polysaccharide (PPSV23) vaccine. You may need one or two doses if you smoke cigarettes or if you have certain conditions.  Meningococcal vaccine. You may need this if you have certain  conditions.  Hepatitis A vaccine. You may need this if you have certain conditions or if you travel or work in places where you may be exposed to hepatitis A.  Hepatitis B vaccine. You may need this if you have certain conditions or if you travel or work in places where you may be exposed to hepatitis B.  Haemophilus influenzae type b (Hib) vaccine. You may need this if you have certain conditions.  Talk to your health care provider about which screenings and vaccines you need and how often you need them. This information is not intended to replace advice given to you by your health care provider. Make sure you discuss any questions you have with your health care provider. Document Released: 03/24/2015 Document Revised: 11/15/2015 Document Reviewed: 12/27/2014 Elsevier Interactive Patient Education  2017 [employer].

## 2016-12-30 NOTE — Progress Notes (Addendum)
Name: Paula [name]   MRN: [id number]    DOB: [birth year]   Date:12/30/2016       Progress Note  Subjective  Chief Complaint  Chief Complaint  Patient presents with  . Annual Exam  . Hypertension    HPI  Well Woman:  She is dating same person for over 30 years but they are not sexually active. No breast lumps, just lumpy breasts. Denies vaginal discharge. She has urge incontinence. Discussed Kegel exercise  HTN: taking medication, and denies side effects, and is compliant with Losatan/hctz and amlodipine. No checking bp at home. No chest pain, SOB or palpitation.    Right ankle and foot pain: she has seen Dr. Ether Griffins, and had right foot steroid injection and is doing better, she has been wearing insoles, denies limping. Taking Meloxicam prn and needs a refill.   Seizure disorder: she is driving again since June 2016, she has been seizure free over the past four years. She is compliant with medications, and denies side effects Continue follow up at [facility]- Dr. Alessandra Bevels. Labs checked by him  Asthma : mild intermittently, she states she noticed some wheezing over the past few days, she denies cough or SOB, she would like to have a refill of Symbicort to take until better, she only uses inhaler prn, and has not used in the past 6 months. No nocturnal symptoms  OSA: used to wear CPAP but had lost weight and was off CPAP,  she is not sure if she snores, waking up feeling rested, she states Dr. Alessandra Bevels sent her for another sleep study and she does not need CPAP at this time,  Allergic Rhinitis: she is only taking medication prn, she denies rhinorrhea or nasal congestion at this time  Obesity: weight is unchanged and she is not interested in weight loss management at this time. She joined a gym one year ago,  but has not gone to the gym yet.  Anemia: post-menopausal now, she sees hematologist, Dr. Orlie Dakin. Colonoscopy is up to date  Vitamin D deficiency: she has been taking Vitamin D  supplements  Patient Active Problem List   Diagnosis Date Noted  . 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 Plantar fasciitis, right 06/17/2016  . Osteoarthritis of right foot 06/17/2016  . Primary osteoarthritis, right ankle and foot 09/13/2015  . Carpal tunnel syndrome, right 09/13/2015  . Tarsal tunnel syndrome of right side 09/13/2015  . 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 Asthma, mild intermittent 08/15/2014  . Benign hypertension 08/15/2014  . Herpes genitalis in women 08/15/2014  . Allergic rhinitis 08/15/2014  . Cerebral seizure 08/15/2014  . Vitamin D deficiency 08/15/2014  . Morbid obesity (HCC) 08/03/2014  . Adhesive capsulitis of left shoulder 06/16/2013  . Obstructive sleep apnea of adult 06/16/2013  . Epilepsy (HCC) 06/16/2013  . Intractable epilepsy (HCC) 06/16/2013  . Anemia, iron deficiency 09/30/2006    Past Surgical History:  Procedure Laterality Date  . BREAST BIOPSY Left    age 89 tumor removed  . BREAST SURGERY Left    benign lump at age 68    Family History  Problem Relation Age of Onset  . Cancer Mother        Breast  . Hypertension Mother   . Diabetes Mother   . Hyperlipidemia Mother   . Breast cancer Mother     Social History   Social History  . Marital status: Single    Spouse name:  N/A  . Number of children: 0  . Years of education: N/A   Occupational History  . lab specialist [employer]   Social History Main Topics  . Smoking status: Never Smoker  . Smokeless tobacco: Never Used  . Alcohol use No  . Drug use: No  . Sexual activity: Not Currently   Other Topics Concern  . Not on file   Social History Narrative   Lives with mother and boyfriend   Never had children    Works for [employer]      Current Outpatient Prescriptions:  .  albuterol (PROVENTIL HFA;VENTOLIN HFA) 108 (90 BASE) MCG/ACT inhaler, Inhale 2 puffs into the lungs every 6 (six) hours as needed., Disp: ,  Rfl:  .  carbamazepine (TEGRETOL) 200 MG tablet, TEGRETOL, 200MG  (Oral Tablet)  one a half m and afternoon and 2 and half qhs for 0 days  Quantity: 1.00;  Refills: 0   Ordered :26-Jan-2010  Alba Cory MD;  Started 14-Mar-2008 Active, Disp: , Rfl:  .  cetirizine (ZYRTEC) 10 MG tablet, Take 1 tablet (10 mg total) by mouth daily., Disp: 30 tablet, Rfl: 1 .  fluticasone (FLONASE) 50 MCG/ACT nasal spray, Place 2 sprays into both nostrils daily., Disp: 48 g, Rfl: 1 .  levETIRAcetam (KEPPRA) 500 MG tablet, Take 3 tablets by mouth 2 (two) times daily., Disp: , Rfl:  .  meloxicam (MOBIC) 15 MG tablet, Take 1 tablet (15 mg total) by mouth daily., Disp: 90 tablet, Rfl: 0 .  topiramate (TOPAMAX) 100 MG tablet, Take 3 tablets by mouth 3 (three) times daily., Disp: , Rfl:  .  triamcinolone ointment (KENALOG) 0.1 %, Apply 1 application topically 2 (two) times daily., Disp: 30 g, Rfl: 1 .  Vitamin D, Ergocalciferol, (DRISDOL) 50000 units CAPS capsule, Take 1 capsule by mouth  every 7 days, Disp: 12 capsule, Rfl: 1 .  amLODipine (NORVASC) 2.5 MG tablet, Take 1 tablet (2.5 mg total) by mouth daily., Disp: 90 tablet, Rfl: 1 .  aspirin EC 81 MG tablet, Take 1 tablet (81 mg total) by mouth daily., Disp: 30 tablet, Rfl: 0 .  losartan-hydrochlorothiazide (HYZAAR) 100-12.5 MG tablet, Take 1 tablet by mouth daily., Disp: 90 tablet, Rfl: 1 .  pimecrolimus (ELIDEL) 1 % cream, Apply topically 2 (two) times daily., Disp: 100 g, Rfl: 0 .  valACYclovir (VALTREX) 1000 MG tablet, Take 1 tablet (1,000 mg total) by mouth 2 (two) times daily., Disp: 20 tablet, Rfl: 0  Allergies  Allergen Reactions  . Ace Inhibitors     ROS  Constitutional: Negative for fever or weight change.  Respiratory: Negative for cough and shortness of breath.   Cardiovascular: Negative for chest pain or palpitations.  Gastrointestinal: Negative for abdominal pain, no bowel changes.  Musculoskeletal: Negative for gait problem or joint swelling.   Skin: Negative for rash.  Neurological: Negative for dizziness or headache.  No other specific complaints in a complete review of systems (except as listed in HPI above).  Objective  Vitals:   12/30/16 0828  BP: 140/80  Pulse: 80  Resp: 14  Temp: 98 F (36.7 C)  TempSrc: Oral  SpO2: 97%  Weight: (!) 300 lb 3.2 oz (136.2 kg)  Height: 5\' 4"  (1.626 m)    Body mass index is 51.53 kg/m.  Physical Exam  Constitutional: Patient appears well-developed and well-nourished. No distress.  HENT: Head: Normocephalic and atraumatic. Ears: B TMs ok, no erythema or effusion; Nose: Nose normal. Mouth/Throat: Oropharynx is clear and moist. No oropharyngeal  exudate. Torus palatino Eyes: Conjunctivae and EOM are normal. Pupils are equal, round, and reactive to light. No scleral icterus.  Neck: Normal range of motion. Neck supple. No JVD present. No thyromegaly present.  Cardiovascular: Normal rate, regular rhythm and normal heart sounds.  No murmur heard. No BLE edema. Pulmonary/Chest: Effort normal and breath sounds normal. No respiratory distress. Abdominal: Soft. Bowel sounds are normal, no distension. There is no tenderness. no masses Breast: no lumps or masses, no nipple discharge or rashes FEMALE GENITALIA:  Did not want to have exam RECTAL: not done Musculoskeletal: Normal range of motion, no joint effusions. No gross deformities Neurological: he is alert and oriented to person, place, and time. No cranial nerve deficit. Coordination, balance, strength, speech and gait are normal.  Skin: Skin is warm and dry. She has a papular rash on anterior chest, also keratosis pilaris on her back and arms.  Psychiatric: Patient has a normal mood and affect. behavior is normal. Judgment and thought content normal.  PHQ2/9: Depression screen [facility] 2/9 12/30/2016 06/17/2016 12/18/2015 09/13/2015 08/15/2014  Decreased Interest 0 0 0 0 0  Down, Depressed, Hopeless 0 0 0 0 0  PHQ - 2 Score 0 0 0 0 0      Fall Risk: Fall Risk  12/30/2016 12/30/2016 06/17/2016 12/18/2015 09/13/2015  Falls in the past year? Yes No No No No  Number falls in past yr: 1 - - - -  Injury with Fall? No - - - -  Follow up Education provided - - - -    Functional Status Survey: Is the patient deaf or have difficulty hearing?: No Does the patient have difficulty seeing, even when wearing glasses/contacts?: No Does the patient have difficulty concentrating, remembering, or making decisions?: No Does the patient have difficulty walking or climbing stairs?: Yes Does the patient have difficulty dressing or bathing?: No Does the patient have difficulty doing errands alone such as visiting a doctor's office or shopping?: No  Current Exercise Habits: The patient does not participate in regular exercise at present     Assessment & Plan  1. Well woman exam  Discussed importance of 150 minutes of physical activity weekly, eat two servings of fish weekly, eat one serving of tree nuts ( cashews, pistachios, pecans, almonds.Marland Kitchen) every other day, eat 6 servings of fruit/vegetables daily and drink plenty of water and avoid sweet beverages.  - CBC with Differential/Platelet - Hemoglobin A1c - HIV antibody - Lipid panel - TSH - VITAMIN D 25 Hydroxy (Vit-D Deficiency, Fractures) - Vitamin B12 - Comprehensive metabolic panel  2. Benign hypertension  - amLODipine (NORVASC) 2.5 MG tablet; Take 1 tablet (2.5 mg total) by mouth daily.  Dispense: 90 tablet; Refill: 1 - losartan-hydrochlorothiazide (HYZAAR) 100-12.5 MG tablet; Take 1 tablet by mouth daily.  Dispense: 90 tablet; Refill: 1  3. Breast cancer screening  Up to date  4. Obstructive sleep apnea of adult  She is not compliant with CPAP   5. Cerebral seizure  Keep follow up with neurologist, no recent episodes  6. Mild intermittent asthma without complication  Symbicort   7. Morbid obesity, unspecified obesity type [facility])  Discussed with the patient the  risk posed by an increased BMI. Discussed importance of portion control, calorie counting and at least 150 minutes of physical activity weekly. Avoid sweet beverages and drink more water. Eat at least 6 servings of fruit and vegetables daily   8. Primary osteoarthritis of right foot  - meloxicam (MOBIC) 15 MG tablet; Take  1 tablet (15 mg total) by mouth daily.  Dispense: 90 tablet; Refill: 0  9. Vitamin D deficiency   10. Oral herpes simplex infection  - valACYclovir (VALTREX) 1000 MG tablet; Take 1 tablet (1,000 mg total) by mouth 2 (two) times daily.  Dispense: 20 tablet; Refill: 0  11. Eczema, unspecified type  - pimecrolimus (ELIDEL) 1 % cream; Apply topically 2 (two) times daily.  Dispense: 100 g; Refill: 0

## 2016-12-31 DIAGNOSIS — Z01419 Encounter for gynecological examination (general) (routine) without abnormal findings: Secondary | ICD-10-CM | POA: Diagnosis not present

## 2017-01-01 ENCOUNTER — Encounter: Payer: Self-pay | Admitting: Family Medicine

## 2017-01-01 ENCOUNTER — Other Ambulatory Visit: Payer: Self-pay | Admitting: Family Medicine

## 2017-01-01 DIAGNOSIS — R7303 Prediabetes: Secondary | ICD-10-CM | POA: Insufficient documentation

## 2017-01-01 LAB — COMPREHENSIVE METABOLIC PANEL
A/G RATIO: 1.5 (ref 1.2–2.2)
ALK PHOS: 127 IU/L — AB (ref 39–117)
ALT: 8 IU/L (ref 0–32)
AST: 10 IU/L (ref 0–40)
Albumin: 4.3 g/dL (ref 3.5–5.5)
BUN / CREAT RATIO: 15 (ref 9–23)
BUN: 13 mg/dL (ref 6–24)
CHLORIDE: 104 mmol/L (ref 96–106)
CO2: 26 mmol/L (ref 20–29)
Calcium: 9.3 mg/dL (ref 8.7–10.2)
Creatinine, Ser: 0.87 mg/dL (ref 0.57–1.00)
GFR calc Af Amer: 87 mL/min/{1.73_m2} (ref >59)
GFR calc non Af Amer: 76 mL/min/{1.73_m2} (ref >59)
GLOBULIN, TOTAL: 2.9 g/dL (ref 1.5–4.5)
Glucose: 112 mg/dL — ABNORMAL HIGH (ref 65–99)
POTASSIUM: 3.8 mmol/L (ref 3.5–5.2)
SODIUM: 140 mmol/L (ref 134–144)
Total Protein: 7.2 g/dL (ref 6.0–8.5)

## 2017-01-01 LAB — CBC WITH DIFFERENTIAL/PLATELET
Basophils Absolute: 0 10*3/uL (ref 0.0–0.2)
Basos: 0 %
EOS (ABSOLUTE): 0.1 10*3/uL (ref 0.0–0.4)
EOS: 4 %
HEMATOCRIT: 33 % — AB (ref 34.0–46.6)
HEMOGLOBIN: 10.6 g/dL — AB (ref 11.1–15.9)
IMMATURE GRANS (ABS): 0 10*3/uL (ref 0.0–0.1)
Immature Granulocytes: 0 %
LYMPHS ABS: 1 10*3/uL (ref 0.7–3.1)
Lymphs: 29 %
MCH: 26.6 pg (ref 26.6–33.0)
MCHC: 32.1 g/dL (ref 31.5–35.7)
MCV: 83 fL (ref 79–97)
MONOCYTES: 6 %
Monocytes Absolute: 0.2 10*3/uL (ref 0.1–0.9)
NEUTROS ABS: 2 10*3/uL (ref 1.4–7.0)
Neutrophils: 61 %
Platelets: 246 10*3/uL (ref 150–379)
RBC: 3.99 x10E6/uL (ref 3.77–5.28)
RDW: 15 % (ref 12.3–15.4)
WBC: 3.3 10*3/uL — ABNORMAL LOW (ref 3.4–10.8)

## 2017-01-01 LAB — LIPID PANEL
CHOL/HDL RATIO: 2.5 ratio (ref 0.0–4.4)
CHOLESTEROL TOTAL: 220 mg/dL — AB (ref 100–199)
HDL: 89 mg/dL (ref >39)
LDL CALC: 111 mg/dL — AB (ref 0–99)
TRIGLYCERIDES: 99 mg/dL (ref 0–149)
VLDL Cholesterol Cal: 20 mg/dL (ref 5–40)

## 2017-01-01 LAB — TSH: TSH: 1.68 u[IU]/mL (ref 0.450–4.500)

## 2017-01-01 LAB — HEMOGLOBIN A1C
ESTIMATED AVERAGE GLUCOSE: 117 mg/dL
Hgb A1c MFr Bld: 5.7 % — ABNORMAL HIGH (ref 4.8–5.6)

## 2017-01-01 LAB — VITAMIN B12: VITAMIN B 12: 346 pg/mL (ref 232–1245)

## 2017-01-01 LAB — VITAMIN D 25 HYDROXY (VIT D DEFICIENCY, FRACTURES): VIT D 25 HYDROXY: 11.1 ng/mL — AB (ref 30.0–100.0)

## 2017-01-01 LAB — HIV ANTIBODY (ROUTINE TESTING W REFLEX): HIV Screen 4th Generation wRfx: NONREACTIVE

## 2017-01-01 MED ORDER — ROSUVASTATIN CALCIUM 10 MG PO TABS: 10.0000 mg | ORAL_TABLET | Freq: Every day | ORAL | 1 refills | Status: DC

## 2017-01-27 ENCOUNTER — Ambulatory Visit (INDEPENDENT_AMBULATORY_CARE_PROVIDER_SITE_OTHER): Payer: 59 | Admitting: Family Medicine

## 2017-01-27 ENCOUNTER — Encounter: Payer: Self-pay | Admitting: Family Medicine

## 2017-01-27 VITALS — BP 128/78 | HR 99 | Temp 98.3°F | Resp 16 | Ht 64.0 in | Wt 300.5 lb

## 2017-01-27 DIAGNOSIS — J45901 Unspecified asthma with (acute) exacerbation: Secondary | ICD-10-CM | POA: Diagnosis not present

## 2017-01-27 DIAGNOSIS — J069 Acute upper respiratory infection, unspecified: Secondary | ICD-10-CM

## 2017-01-27 DIAGNOSIS — I10 Essential (primary) hypertension: Secondary | ICD-10-CM | POA: Diagnosis not present

## 2017-01-27 MED ORDER — BUDESONIDE-FORMOTEROL FUMARATE 160-4.5 MCG/ACT IN AERO: 2.0000 | INHALATION_SPRAY | Freq: Two times a day (BID) | RESPIRATORY_TRACT | 2 refills | Status: DC

## 2017-01-27 MED ORDER — ALBUTEROL SULFATE HFA 108 (90 BASE) MCG/ACT IN AERS: 2.0000 | INHALATION_SPRAY | Freq: Four times a day (QID) | RESPIRATORY_TRACT | 0 refills | Status: DC | PRN

## 2017-01-27 NOTE — Patient Instructions (Signed)
Coricidin HBP for cold symptoms and also saline spray are safe

## 2017-01-27 NOTE — Progress Notes (Signed)
Name: Paula [name]   MRN: [name]    DOB: 05-16-1960   Date:01/27/2017       Progress Note  Subjective  Chief Complaint  Chief Complaint  Patient presents with  . Cough    Onset-2 days, nasal drainage, wheezing, SOB, congested. Chest tightness from coughing so much  . Asthma    HPI  URI/asthma flare: she states that she developed rhinorrhea, nasal congestion, mild sore throat and a cough two days ago. No fever, chills, nausea , vomiting or change in appetite. She states she has also noticed some wheezing at night. She had a symbicort at home but did not use it yet. No shortness of breath. She feels some chest congestion.   HTN: bp elevated when she came in. BP is usually at goal    Patient Active Problem List   Diagnosis Date Noted  . Pre-diabetes 01/01/2017  . 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 Plantar fasciitis, right 06/17/2016  . Osteoarthritis of right foot 06/17/2016  . Primary osteoarthritis, right ankle and foot 09/13/2015  . Carpal tunnel syndrome, right 09/13/2015  . Tarsal tunnel syndrome of right side 09/13/2015  . 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 Asthma, mild intermittent 08/15/2014  . Benign hypertension 08/15/2014  . Herpes genitalis in women 08/15/2014  . Allergic rhinitis 08/15/2014  . Cerebral seizure 08/15/2014  . Vitamin D deficiency 08/15/2014  . Morbid obesity (HCC) 08/03/2014  . Adhesive capsulitis of left shoulder 06/16/2013  . Obstructive sleep apnea of adult 06/16/2013  . Epilepsy (HCC) 06/16/2013  . Intractable epilepsy (HCC) 06/16/2013  . Anemia, iron deficiency 09/30/2006    Past Surgical History:  Procedure Laterality Date  . BREAST BIOPSY Left    age 56 tumor removed  . BREAST SURGERY Left    benign lump at age 56    Family History  Problem Relation Age of Onset  . Cancer Mother        Breast  . Hypertension Mother   . Diabetes Mother   . Hyperlipidemia  Mother   . Breast cancer Mother     Social History   Socioeconomic History  . Marital status: Single    Spouse name: Not on file  . Number of children: 0  . Years of education: Not on file  . Highest education level: Not on file  Social Needs  . Financial resource strain: Not on file  . Food insecurity - worry: Not on file  . Food insecurity - inability: Not on file  . Transportation needs - medical: Not on file  . Transportation needs - non-medical: Not on file  Occupational History  . Occupation: lab specialist    Employer: [employer]  Tobacco Use  . Smoking status: Never Smoker  . Smokeless tobacco: Never Used  Substance and Sexual Activity  . Alcohol use: No    Alcohol/week: 0.0 oz  . Drug use: No  . Sexual activity: Not Currently  Other Topics Concern  . Not on file  Social History Narrative   Lives with mother and boyfriend   Never had children    Works for [employer]      Current Outpatient Medications:  .  albuterol (PROVENTIL HFA;VENTOLIN HFA) 108 (90 Base) MCG/ACT inhaler, Inhale 2 puffs every 6 (six) hours as needed into the lungs., Disp: 1 Inhaler, Rfl: 0 .  amLODipine (NORVASC) 2.5 MG tablet, Take 1 tablet (2.5 mg total) by mouth daily., Disp: 90 tablet, Rfl:  1 .  aspirin EC 81 MG tablet, Take 1 tablet (81 mg total) by mouth daily., Disp: 30 tablet, Rfl: 0 .  budesonide-formoterol (SYMBICORT) 160-4.5 MCG/ACT inhaler, Inhale 2 puffs 2 (two) times daily into the lungs., Disp: 1 Inhaler, Rfl: 2 .  carbamazepine (TEGRETOL) 200 MG tablet, TEGRETOL, 200MG  (Oral Tablet)  one a half m and afternoon and 2 and half qhs for 0 days  Quantity: 1.00;  Refills: 0   Ordered :26-Jan-2010  Alba Cory MD;  Started 14-Mar-2008 Active, Disp: , Rfl:  .  cetirizine (ZYRTEC) 10 MG tablet, Take 1 tablet (10 mg total) by mouth daily., Disp: 30 tablet, Rfl: 1 .  fluticasone (FLONASE) 50 MCG/ACT nasal spray, Place 2 sprays into both nostrils daily., Disp: 48 g, Rfl: 1 .  levETIRAcetam  (KEPPRA) 500 MG tablet, Take 3 tablets by mouth 2 (two) times daily., Disp: , Rfl:  .  losartan-hydrochlorothiazide (HYZAAR) 100-12.5 MG tablet, Take 1 tablet by mouth daily., Disp: 90 tablet, Rfl: 1 .  meloxicam (MOBIC) 15 MG tablet, Take 1 tablet (15 mg total) by mouth daily., Disp: 90 tablet, Rfl: 0 .  pimecrolimus (ELIDEL) 1 % cream, Apply topically 2 (two) times daily., Disp: 100 g, Rfl: 0 .  rosuvastatin (CRESTOR) 10 MG tablet, Take 1 tablet (10 mg total) by mouth daily., Disp: 90 tablet, Rfl: 1 .  topiramate (TOPAMAX) 100 MG tablet, Take 3 tablets by mouth 3 (three) times daily., Disp: , Rfl:  .  triamcinolone ointment (KENALOG) 0.1 %, Apply 1 application topically 2 (two) times daily., Disp: 30 g, Rfl: 1 .  valACYclovir (VALTREX) 1000 MG tablet, Take 1 tablet (1,000 mg total) by mouth 2 (two) times daily., Disp: 20 tablet, Rfl: 0 .  Vitamin D, Ergocalciferol, (DRISDOL) 50000 units CAPS capsule, Take 1 capsule by mouth  every 7 days, Disp: 12 capsule, Rfl: 1  Allergies  Allergen Reactions  . Ace Inhibitors      ROS  Ten systems reviewed and is negative except as mentioned in HPI   Objective  Vitals:   01/27/17 1541 01/27/17 1608  BP: (!) 154/88 128/78  Pulse: 99   Resp: 16   Temp: 98.3 F (36.8 C)   TempSrc: Oral   SpO2: 98%   Weight: (!) 300 lb 8 oz (136.3 kg)   Height: 5\' 4"  (1.626 m)     Body mass index is 51.58 kg/m.  Physical Exam  Constitutional: Patient appears well-developed and well-nourished. Obese  No distress.  HEENT: head atraumatic, normocephalic, pupils equal and reactive to light, ears normal TM billeralty , neck supple, throat within normal limits, boggy turbinates with erythema Cardiovascular: Normal rate, regular rhythm and normal heart sounds.  No murmur heard. No BLE edema. Pulmonary/Chest: Effort normal and breath sounds normal. No respiratory distress. Abdominal: Soft.  There is no tenderness. Psychiatric: Patient has a normal mood and  affect. behavior is normal. Judgment and thought content normal.  Recent Results (from the past 2160 hour(s))  CBC with Differential/Platelet     Status: Abnormal   Collection Time: 12/31/16  1:53 PM  Result Value Ref Range   WBC 3.3 (L) 3.4 - 10.8 x10E3/uL   RBC 3.99 3.77 - 5.28 x10E6/uL   Hemoglobin 10.6 (L) 11.1 - 15.9 g/dL   Hematocrit 16.1 (L) 09.6 - 46.6 %   MCV 83 79 - 97 fL   MCH 26.6 26.6 - 33.0 pg   MCHC 32.1 31.5 - 35.7 g/dL   RDW 04.5 40.9 - 81.1 %  Platelets 246 150 - 379 x10E3/uL   Neutrophils 61 Not Estab. %   Lymphs 29 Not Estab. %   Monocytes 6 Not Estab. %   Eos 4 Not Estab. %   Basos 0 Not Estab. %   Neutrophils Absolute 2.0 1.4 - 7.0 x10E3/uL   Lymphocytes Absolute 1.0 0.7 - 3.1 x10E3/uL   Monocytes Absolute 0.2 0.1 - 0.9 x10E3/uL   EOS (ABSOLUTE) 0.1 0.0 - 0.4 x10E3/uL   Basophils Absolute 0.0 0.0 - 0.2 x10E3/uL   Immature Granulocytes 0 Not Estab. %   Immature Grans (Abs) 0.0 0.0 - 0.1 x10E3/uL  Hemoglobin A1c     Status: Abnormal   Collection Time: 12/31/16  1:53 PM  Result Value Ref Range   Hgb A1c MFr Bld 5.7 (H) 4.8 - 5.6 %    Comment:          Prediabetes: 5.7 - 6.4          Diabetes: >6.4          Glycemic control for adults with diabetes: <7.0    Est. average glucose Bld gHb Est-mCnc 117 mg/dL  HIV antibody     Status: None   Collection Time: 12/31/16  1:53 PM  Result Value Ref Range   HIV Screen 4th Generation wRfx Non Reactive Non Reactive  Lipid panel     Status: Abnormal   Collection Time: 12/31/16  1:53 PM  Result Value Ref Range   Cholesterol, Total 220 (H) 100 - 199 mg/dL   Triglycerides 99 0 - 149 mg/dL   HDL 89 >47>39 mg/dL   VLDL Cholesterol Cal 20 5 - 40 mg/dL   LDL Calculated 829111 (H) 0 - 99 mg/dL   Chol/HDL Ratio 2.5 0.0 - 4.4 ratio    Comment:                                   T. Chol/HDL Ratio                                             Men  Women                               1/2 Avg.Risk  3.4    3.3                                    Avg.Risk  5.0    4.4                                2X Avg.Risk  9.6    7.1                                3X Avg.Risk 23.4   11.0   TSH     Status: None   Collection Time: 12/31/16  1:53 PM  Result Value Ref Range   TSH 1.680 0.450 - 4.500 uIU/mL  VITAMIN D 25 Hydroxy (Vit-D Deficiency, Fractures)     Status: Abnormal   Collection Time: 12/31/16  1:53 PM  Result Value  Ref Range   Vit D, 25-Hydroxy 11.1 (L) 30.0 - 100.0 ng/mL    Comment: Vitamin D deficiency has been defined by the Institute of Medicine and an Endocrine Society practice guideline as a level of serum 25-OH vitamin D less than 20 ng/mL (1,2). The Endocrine Society went on to further define vitamin D insufficiency as a level between 21 and 29 ng/mL (2). 1. IOM (Institute of Medicine). 2010. Dietary reference    intakes for calcium and D. [place]: The    [employer]. 2. Holick MF, Binkley [place], Bischoff-Ferrari HA, et al.    Evaluation, treatment, and prevention of vitamin D    deficiency: an Endocrine Society clinical practice    guideline. JCEM. 2011 Jul; 96(7):1911-30.   Vitamin B12     Status: None   Collection Time: 12/31/16  1:53 PM  Result Value Ref Range   Vitamin B-12 346 232 - 1,245 pg/mL  Comprehensive metabolic panel     Status: Abnormal   Collection Time: 12/31/16  1:53 PM  Result Value Ref Range   Glucose 112 (H) 65 - 99 mg/dL   BUN 13 6 - 24 mg/dL   Creatinine, Ser 3.24 0.57 - 1.00 mg/dL   GFR calc non Af Amer 76 >59 mL/min/1.73   GFR calc Af Amer 87 >59 mL/min/1.73   BUN/Creatinine Ratio 15 9 - 23   Sodium 140 134 - 144 mmol/L   Potassium 3.8 3.5 - 5.2 mmol/L   Chloride 104 96 - 106 mmol/L   CO2 26 20 - 29 mmol/L   Calcium 9.3 8.7 - 10.2 mg/dL   Total Protein 7.2 6.0 - 8.5 g/dL   Albumin 4.3 3.5 - 5.5 g/dL   Globulin, Total 2.9 1.5 - 4.5 g/dL   Albumin/Globulin Ratio 1.5 1.2 - 2.2   Bilirubin Total <0.2 0.0 - 1.2 mg/dL   Alkaline Phosphatase 127 (H) 39 - 117 IU/L    AST 10 0 - 40 IU/L   ALT 8 0 - 32 IU/L     PHQ2/9: Depression screen [facility] 2/9 12/30/2016 06/17/2016 12/18/2015 09/13/2015 08/15/2014  Decreased Interest 0 0 0 0 0  Down, Depressed, Hopeless 0 0 0 0 0  PHQ - 2 Score 0 0 0 0 0     Fall Risk: Fall Risk  12/30/2016 12/30/2016 06/17/2016 12/18/2015 09/13/2015  Falls in the past year? Yes No No No No  Number falls in past yr: 1 - - - -  Injury with Fall? No - - - -  Follow up Education provided - - - -      Assessment & Plan  1. Asthma exacerbation, mild  - budesonide-formoterol (SYMBICORT) 160-4.5 MCG/ACT inhaler; Inhale 2 puffs 2 (two) times daily into the lungs.  Dispense: 1 Inhaler; Refill: 2 - albuterol (PROVENTIL HFA;VENTOLIN HFA) 108 (90 Base) MCG/ACT inhaler; Inhale 2 puffs every 6 (six) hours as needed into the lungs.  Dispense: 1 Inhaler; Refill: 0  We will hold off on oral prednisone or antibiotics at this time, explained that we are opened Friday am and she can call her back or return for follow up if needed. She was given first dose of symbicort in our office and advised to repeat in am and every 12 hours after that.    2. URI, acute  May use saline spray and take coricidin HBP  3. Benign hypertension  bp back to normal after rest   -Red flags and when to present for emergency care or RTC including  fever >101.42F, chest pain, shortness of breath, new/worsening/un-resolving symptoms,reviewed with patient at time of visit. Follow up and care instructions discussed and provided in AVS.

## 2017-02-06 DIAGNOSIS — D509 Iron deficiency anemia, unspecified: Secondary | ICD-10-CM | POA: Diagnosis not present

## 2017-02-09 NOTE — Progress Notes (Signed)
[facility]  Telephone:(336) [phone] [phone]) [phone]  ID: Paula Combesynthia L [name] OB: 07-Mar-1961  MR#: [id number]  AOZ#:[id number]#:[id number]  Patient Care Team: Alba CorySowles, Krichna, MD as PCP - General (Family Medicine) Gwyneth RevelsFowler, Justin, DPM as Consulting Physician (Podiatry) Bonnee QuinVaughn, Bradley V, MD as Consulting Physician (Neurology) Alwyn Peaallwood, Dwayne D, MD as Consulting Physician (Cardiology)  CHIEF COMPLAINT: Iron deficiency anemia.  INTERVAL HISTORY: Patient returns to clinic today for repeat laboratory work and clinical evaluation. She has chronic weakness and fatigue. Despite this, she continues to be active and work full-time. She has no neurologic complaints. She denies fever or chills. She denies any weight loss or weight gain. She denies any current chest pain or shortness of breath.  She denies any nausea, vomiting, constipation, or diarrhea. She has no urinary complaints. Patient offers no specific complaints today.  REVIEW OF SYSTEMS:   Review of Systems  Constitutional: Positive for malaise/fatigue. Negative for fever and weight loss.  Respiratory: Negative for cough and shortness of breath.   Cardiovascular: Negative.  Negative for chest pain and leg swelling.  Gastrointestinal: Negative.  Negative for abdominal pain, blood in stool and melena.  Genitourinary: Negative.   Musculoskeletal: Negative.   Skin: Negative.  Negative for rash.  Neurological: Positive for weakness.  Psychiatric/Behavioral: Negative.  The patient is not nervous/anxious.     As per HPI. Otherwise, a complete review of systems is negative.  PAST MEDICAL HISTORY: Anemia, hypertension, seizure disorder, sleep apnea, hypertension, left breast lumpectomy with Dr. Lemar LivingsByrnett. Colonoscopy in 2004.  PAST SURGICAL HISTORY: Past Surgical History:  Procedure Laterality Date  . BREAST BIOPSY Left    age 56 tumor removed  . BREAST SURGERY Left    benign lump at age 56    FAMILY HISTORY Family History    Problem Relation Age of Onset  . Cancer Mother        Breast  . Hypertension Mother   . Diabetes Mother   . Hyperlipidemia Mother   . Breast cancer Mother        ADVANCED DIRECTIVES:    HEALTH MAINTENANCE: Social History   Tobacco Use  . Smoking status: Never Smoker  . Smokeless tobacco: Never Used  Substance Use Topics  . Alcohol use: No    Alcohol/week: 0.0 oz  . Drug use: No     Allergies  Allergen Reactions  . Ace Inhibitors     Current Outpatient Medications  Medication Sig Dispense Refill  . albuterol (PROVENTIL HFA;VENTOLIN HFA) 108 (90 Base) MCG/ACT inhaler Inhale 2 puffs every 6 (six) hours as needed into the lungs. 1 Inhaler 0  . amLODipine (NORVASC) 2.5 MG tablet Take 1 tablet (2.5 mg total) by mouth daily. 90 tablet 1  . aspirin EC 81 MG tablet Take 1 tablet (81 mg total) by mouth daily. 30 tablet 0  . budesonide-formoterol (SYMBICORT) 160-4.5 MCG/ACT inhaler Inhale 2 puffs 2 (two) times daily into the lungs. 1 Inhaler 2  . carbamazepine (TEGRETOL) 200 MG tablet TEGRETOL, 200MG  (Oral Tablet)  one a half m and afternoon and 2 and half qhs for 0 days  Quantity: 1.00;  Refills: 0   Ordered :26-Jan-2010  Alba CorySOWLES, KRICHNA MD;  Mora ApplStarted 14-Mar-2008 Active    . cetirizine (ZYRTEC) 10 MG tablet Take 1 tablet (10 mg total) by mouth daily. 30 tablet 1  . fluticasone (FLONASE) 50 MCG/ACT nasal spray Place 2 sprays into both nostrils daily. 48 g 1  . levETIRAcetam (KEPPRA) 500 MG tablet Take 3 tablets by mouth 2 (two)  times daily.    Marland Kitchen. losartan-hydrochlorothiazide (HYZAAR) 100-12.5 MG tablet Take 1 tablet by mouth daily. 90 tablet 1  . meloxicam (MOBIC) 15 MG tablet Take 1 tablet (15 mg total) by mouth daily. 90 tablet 0  . pimecrolimus (ELIDEL) 1 % cream Apply topically 2 (two) times daily. 100 g 0  . rosuvastatin (CRESTOR) 10 MG tablet Take 1 tablet (10 mg total) by mouth daily. 90 tablet 1  . topiramate (TOPAMAX) 100 MG tablet Take 3 tablets by mouth 3 (three) times  daily.    Marland Kitchen. triamcinolone ointment (KENALOG) 0.1 % Apply 1 application topically 2 (two) times daily. 30 g 1  . valACYclovir (VALTREX) 1000 MG tablet Take 1 tablet (1,000 mg total) by mouth 2 (two) times daily. 20 tablet 0  . Vitamin D, Ergocalciferol, (DRISDOL) 50000 units CAPS capsule Take 1 capsule by mouth  every 7 days 12 capsule 1   No current facility-administered medications for this visit.     OBJECTIVE: Vitals:   02/10/17 1351  BP: 126/83  Pulse: 76  Temp: 98.4 F (36.9 C)     There is no height or weight on file to calculate BMI.    ECOG FS:0 - Asymptomatic  General: Well-developed, well-nourished, no acute distress. Eyes: Pink conjunctiva, anicteric sclera. Lungs: Clear to auscultation bilaterally. Heart: Regular rate and rhythm. No rubs, murmurs, or gallops. Abdomen: Soft, nontender, nondistended. No organomegaly noted, normoactive bowel sounds. Musculoskeletal: No edema, cyanosis, or clubbing. Neuro: Alert, answering all questions appropriately. Cranial nerves grossly intact. Skin: No rashes or petechiae noted. Psych: Normal affect.   LAB RESULTS:  Lab Results  Component Value Date   NA 140 12/31/2016   K 3.8 12/31/2016   CL 104 12/31/2016   CO2 26 12/31/2016   GLUCOSE 112 (H) 12/31/2016   BUN 13 12/31/2016   CREATININE 0.87 12/31/2016   CALCIUM 9.3 12/31/2016   PROT 7.2 12/31/2016   ALBUMIN 4.3 12/31/2016   AST 10 12/31/2016   ALT 8 12/31/2016   ALKPHOS 127 (H) 12/31/2016   BILITOT <0.2 12/31/2016   GFRNONAA 76 12/31/2016   GFRAA 87 12/31/2016    Lab Results  Component Value Date   WBC 3.3 (L) 12/31/2016   NEUTROABS 2.0 12/31/2016   HGB 10.6 (L) 12/31/2016   HCT 33.0 (L) 12/31/2016   MCV 83 12/31/2016   PLT 246 12/31/2016     STUDIES: No results found.  ASSESSMENT: Iron deficiency anemia.  PLAN:    1. Iron deficiency anemia: Patient's hemoglobin is 10.1 which is essentially her baseline.  Her iron stores continue to be within normal.   Patient gets all of her lab work done at [facility]. Her last IV iron was in June 2017. She does not require feraheme today. Return to clinic in 6 months with repeat laboratory work and further evaluation.   Approximately 20 minutes was spent in discussion of which greater than 50% was consultation.  Patient expressed understanding and was in agreement with this plan. She also understands that She can call clinic at any time with any questions, concerns, or complaints.    Jeralyn Ruthsimothy J Finnegan, MD 02/10/17 3:54 PM

## 2017-02-10 ENCOUNTER — Inpatient Hospital Stay: Payer: 59 | Attending: Oncology | Admitting: Oncology

## 2017-02-10 VITALS — BP 126/83 | HR 76 | Temp 98.4°F

## 2017-02-10 DIAGNOSIS — R5383 Other fatigue: Secondary | ICD-10-CM | POA: Insufficient documentation

## 2017-02-10 DIAGNOSIS — R531 Weakness: Secondary | ICD-10-CM | POA: Insufficient documentation

## 2017-02-10 DIAGNOSIS — Z79899 Other long term (current) drug therapy: Secondary | ICD-10-CM | POA: Insufficient documentation

## 2017-02-10 DIAGNOSIS — G4730 Sleep apnea, unspecified: Secondary | ICD-10-CM | POA: Diagnosis not present

## 2017-02-10 DIAGNOSIS — I10 Essential (primary) hypertension: Secondary | ICD-10-CM | POA: Diagnosis not present

## 2017-02-10 DIAGNOSIS — Z803 Family history of malignant neoplasm of breast: Secondary | ICD-10-CM | POA: Insufficient documentation

## 2017-02-10 DIAGNOSIS — Z8669 Personal history of other diseases of the nervous system and sense organs: Secondary | ICD-10-CM | POA: Diagnosis not present

## 2017-02-10 DIAGNOSIS — D509 Iron deficiency anemia, unspecified: Secondary | ICD-10-CM | POA: Insufficient documentation

## 2017-02-10 DIAGNOSIS — Z7982 Long term (current) use of aspirin: Secondary | ICD-10-CM | POA: Insufficient documentation

## 2017-02-13 ENCOUNTER — Encounter: Payer: Self-pay | Admitting: Oncology

## 2017-02-17 ENCOUNTER — Other Ambulatory Visit: Payer: Self-pay | Admitting: Family Medicine

## 2017-02-17 ENCOUNTER — Ambulatory Visit: Payer: 59 | Admitting: Family Medicine

## 2017-02-17 DIAGNOSIS — M19071 Primary osteoarthritis, right ankle and foot: Secondary | ICD-10-CM

## 2017-03-17 ENCOUNTER — Ambulatory Visit: Payer: 59 | Admitting: Family Medicine

## 2017-03-17 ENCOUNTER — Encounter: Payer: Self-pay | Admitting: Family Medicine

## 2017-03-17 VITALS — BP 130/76 | HR 83 | Temp 98.2°F | Resp 18 | Ht 64.0 in | Wt 296.2 lb

## 2017-03-17 DIAGNOSIS — E559 Vitamin D deficiency, unspecified: Secondary | ICD-10-CM | POA: Diagnosis not present

## 2017-03-17 DIAGNOSIS — F321 Major depressive disorder, single episode, moderate: Secondary | ICD-10-CM | POA: Diagnosis not present

## 2017-03-17 DIAGNOSIS — J4520 Mild intermittent asthma, uncomplicated: Secondary | ICD-10-CM | POA: Diagnosis not present

## 2017-03-17 DIAGNOSIS — I10 Essential (primary) hypertension: Secondary | ICD-10-CM | POA: Diagnosis not present

## 2017-03-17 DIAGNOSIS — I6789 Other cerebrovascular disease: Secondary | ICD-10-CM

## 2017-03-17 DIAGNOSIS — L309 Dermatitis, unspecified: Secondary | ICD-10-CM | POA: Diagnosis not present

## 2017-03-17 DIAGNOSIS — D509 Iron deficiency anemia, unspecified: Secondary | ICD-10-CM | POA: Diagnosis not present

## 2017-03-17 DIAGNOSIS — J3089 Other allergic rhinitis: Secondary | ICD-10-CM | POA: Diagnosis not present

## 2017-03-17 DIAGNOSIS — G40909 Epilepsy, unspecified, not intractable, without status epilepticus: Secondary | ICD-10-CM

## 2017-03-17 MED ORDER — VITAMIN D (ERGOCALCIFEROL) 1.25 MG (50000 UNIT) PO CAPS: ORAL_CAPSULE | ORAL | 1 refills | Status: DC

## 2017-03-17 MED ORDER — FLUTICASONE PROPIONATE 50 MCG/ACT NA SUSP: 2.0000 | Freq: Every day | NASAL | 1 refills | Status: DC

## 2017-03-17 MED ORDER — CRISABOROLE 2 % EX OINT: 1.0000 g | TOPICAL_OINTMENT | Freq: Every day | CUTANEOUS | 2 refills | Status: DC

## 2017-03-17 NOTE — Progress Notes (Signed)
Name: Paula [name]   MRN: [id number]    DOB: 04-Jan-1961   Date:03/17/2017       Progress Note  Subjective  Chief Complaint  Chief Complaint  Patient presents with  . Medication Refill  . Hypertension    Denies any symptoms  . Asthma    Coughing  . Sleep Apnea  . Allergic Rhinitis   . Seizure disorder    HPI   HTN: taking medication, and denies side effects, and is compliant with Losatan/hctz and amlodipine. No checking bp at home. No chest pain, SOB or palpitation.  She used to see Dr. Juliann Pares but has not seen him since 2016   Right ankle and foot pain: she has seen Dr. Ether Griffins, and had right foot steroid injection and is doing better, she has been wearing insoles, denies limping. Taking Meloxicam prn and symptoms are stable.   Seizure disorder: she is driving again since June 2016, she has been seizure free over the past four years. She is compliant with medications, and denies side effects Continue follow up at [facility]- Dr. Alessandra Bevels. Labs checked by him ,she is feeling well.    Asthma : she had a flare in Nov 2018, and missed a couple of days of work in Dec 2018 however was not seen in our office for htat flare. She has been using Symbicort since last flare. She denies current wheezing or SOB.   OSA: used to wear CPAP but had lost weight and has been  off CPAP, she is not sure if she snores, waking up feeling rested, she states Dr. Alessandra Bevels sent her for another sleep study and she does not need CPAP at this time,  Obesity: she lost weight since last visit, she states she has been very stressed out, she is the only income for her house and is financially strapped. Mother lives with her and is also a source of stress in her life. She worries about her mother - not doing anything around the house and seems depressed. She dose not want to take anything for depression at this time. She denies suicidal thoughts or ideation    Patient Active Problem List   Diagnosis Date Noted  .  Pre-diabetes 01/01/2017  . 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 Plantar fasciitis, right 06/17/2016  . Osteoarthritis of right foot 06/17/2016  . Primary osteoarthritis, right ankle and foot 09/13/2015  . Carpal tunnel syndrome, right 09/13/2015  . Tarsal tunnel syndrome of right side 09/13/2015  . 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 Asthma, mild intermittent 08/15/2014  . Benign hypertension 08/15/2014  . Herpes genitalis in women 08/15/2014  . Allergic rhinitis 08/15/2014  . Cerebral seizure 08/15/2014  . Vitamin D deficiency 08/15/2014  . Morbid obesity (HCC) 08/03/2014  . Adhesive capsulitis of left shoulder 06/16/2013  . Obstructive sleep apnea of adult 06/16/2013  . Epilepsy (HCC) 06/16/2013  . Intractable epilepsy (HCC) 06/16/2013  . Anemia, iron deficiency 09/30/2006    Past Surgical History:  Procedure Laterality Date  . BREAST BIOPSY Left    age 39 tumor removed  . BREAST SURGERY Left    benign lump at age 57    Family History  Problem Relation Age of Onset  . Cancer Mother        Breast  . Hypertension Mother   . Diabetes Mother   . Hyperlipidemia Mother   . Breast cancer Mother     Social History  Socioeconomic History  . Marital status: Single    Spouse name: Not on file  . Number of children: 0  . Years of education: Not on file  . Highest education level: Not on file  Social Needs  . Financial resource strain: Not on file  . Food insecurity - worry: Not on file  . Food insecurity - inability: Not on file  . Transportation needs - medical: Not on file  . Transportation needs - non-medical: Not on file  Occupational History  . Occupation: lab specialist    Employer: [employer]  Tobacco Use  . Smoking status: Never Smoker  . Smokeless tobacco: Never Used  Substance and Sexual Activity  . Alcohol use: No    Alcohol/week: 0.0 oz  . Drug use: No  . Sexual activity: Not Currently  Other  Topics Concern  . Not on file  Social History Narrative   Lives with mother and boyfriend   Never had children    Works for [employer]      Current Outpatient Medications:  .  albuterol (PROVENTIL HFA;VENTOLIN HFA) 108 (90 Base) MCG/ACT inhaler, Inhale 2 puffs every 6 (six) hours as needed into the lungs., Disp: 1 Inhaler, Rfl: 0 .  amLODipine (NORVASC) 2.5 MG tablet, Take 1 tablet (2.5 mg total) by mouth daily., Disp: 90 tablet, Rfl: 1 .  aspirin EC 81 MG tablet, Take 1 tablet (81 mg total) by mouth daily., Disp: 30 tablet, Rfl: 0 .  budesonide-formoterol (SYMBICORT) 160-4.5 MCG/ACT inhaler, Inhale 2 puffs 2 (two) times daily into the lungs., Disp: 1 Inhaler, Rfl: 2 .  carbamazepine (TEGRETOL) 200 MG tablet, TEGRETOL, 200MG  (Oral Tablet)  one a half m and afternoon and 2 and half qhs for 0 days  Quantity: 1.00;  Refills: 0   Ordered :26-Jan-2010  Alba Cory MD;  Started 14-Mar-2008 Active, Disp: , Rfl:  .  cetirizine (ZYRTEC) 10 MG tablet, Take 1 tablet (10 mg total) by mouth daily., Disp: 30 tablet, Rfl: 1 .  fluticasone (FLONASE) 50 MCG/ACT nasal spray, Place 2 sprays into both nostrils daily., Disp: 48 g, Rfl: 1 .  levETIRAcetam (KEPPRA) 500 MG tablet, Take 3 tablets by mouth 2 (two) times daily., Disp: , Rfl:  .  losartan-hydrochlorothiazide (HYZAAR) 100-12.5 MG tablet, Take 1 tablet by mouth daily., Disp: 90 tablet, Rfl: 1 .  meloxicam (MOBIC) 15 MG tablet, TAKE 1 TABLET BY MOUTH  DAILY, Disp: 90 tablet, Rfl: 0 .  pimecrolimus (ELIDEL) 1 % cream, Apply topically 2 (two) times daily., Disp: 100 g, Rfl: 0 .  rosuvastatin (CRESTOR) 10 MG tablet, Take 1 tablet (10 mg total) by mouth daily., Disp: 90 tablet, Rfl: 1 .  topiramate (TOPAMAX) 100 MG tablet, Take 3 tablets by mouth 3 (three) times daily., Disp: , Rfl:  .  triamcinolone ointment (KENALOG) 0.1 %, Apply 1 application topically 2 (two) times daily., Disp: 30 g, Rfl: 1 .  valACYclovir (VALTREX) 1000 MG tablet, Take 1 tablet (1,000 mg  total) by mouth 2 (two) times daily., Disp: 20 tablet, Rfl: 0 .  Vitamin D, Ergocalciferol, (DRISDOL) 50000 units CAPS capsule, Take 1 capsule by mouth  every 7 days, Disp: 12 capsule, Rfl: 1  Allergies  Allergen Reactions  . Ace Inhibitors      ROS  Constitutional: Negative for fever, positive for  weight change.  Respiratory: Negative for cough and shortness of breath.   Cardiovascular: Negative for chest pain or palpitations.  Gastrointestinal: Negative for abdominal pain, no bowel changes.  Musculoskeletal:  Negative for gait problem or joint swelling.  Skin: Positive  for rash.  Neurological: Negative for dizziness or headache.  No other specific complaints in a complete review of systems (except as listed in HPI above).  Objective  Vitals:   03/17/17 1056  BP: 130/76  Pulse: 83  Resp: 18  Temp: 98.2 F (36.8 C)  TempSrc: Oral  SpO2: 98%  Weight: 296 lb 3.2 oz (134.4 kg)  Height: 5\' 4"  (1.626 m)    Body mass index is 50.84 kg/m.  Physical Exam  Constitutional: Patient appears well-developed and well-nourished. Obese No distress.  HEENT: head atraumatic, normocephalic, pupils equal and reactive to light, eneck supple, throat within normal limits Cardiovascular: Normal rate, regular rhythm and normal heart sounds.  No murmur heard. No BLE edema. Pulmonary/Chest: Effort normal and breath sounds normal. No respiratory distress. Abdominal: Soft.  There is no tenderness. Psychiatric: Patient has a normal mood and affect. behavior is normal. Judgment and thought content normal. Skin: lower legs, scratching it, Elidel not working   Recent Results (from the past 2160 hour(s))  CBC with Differential/Platelet     Status: Abnormal   Collection Time: 12/31/16  1:53 PM  Result Value Ref Range   WBC 3.3 (L) 3.4 - 10.8 x10E3/uL   RBC 3.99 3.77 - 5.28 x10E6/uL   Hemoglobin 10.6 (L) 11.1 - 15.9 g/dL   Hematocrit 16.1 (L) 09.6 - 46.6 %   MCV 83 79 - 97 fL   MCH 26.6 26.6 -  33.0 pg   MCHC 32.1 31.5 - 35.7 g/dL   RDW 04.5 40.9 - 81.1 %   Platelets 246 150 - 379 x10E3/uL   Neutrophils 61 Not Estab. %   Lymphs 29 Not Estab. %   Monocytes 6 Not Estab. %   Eos 4 Not Estab. %   Basos 0 Not Estab. %   Neutrophils Absolute 2.0 1.4 - 7.0 x10E3/uL   Lymphocytes Absolute 1.0 0.7 - 3.1 x10E3/uL   Monocytes Absolute 0.2 0.1 - 0.9 x10E3/uL   EOS (ABSOLUTE) 0.1 0.0 - 0.4 x10E3/uL   Basophils Absolute 0.0 0.0 - 0.2 x10E3/uL   Immature Granulocytes 0 Not Estab. %   Immature Grans (Abs) 0.0 0.0 - 0.1 x10E3/uL  Hemoglobin A1c     Status: Abnormal   Collection Time: 12/31/16  1:53 PM  Result Value Ref Range   Hgb A1c MFr Bld 5.7 (H) 4.8 - 5.6 %    Comment:          Prediabetes: 5.7 - 6.4          Diabetes: >6.4          Glycemic control for adults with diabetes: <7.0    Est. average glucose Bld gHb Est-mCnc 117 mg/dL  HIV antibody     Status: None   Collection Time: 12/31/16  1:53 PM  Result Value Ref Range   HIV Screen 4th Generation wRfx Non Reactive Non Reactive  Lipid panel     Status: Abnormal   Collection Time: 12/31/16  1:53 PM  Result Value Ref Range   Cholesterol, Total 220 (H) 100 - 199 mg/dL   Triglycerides 99 0 - 149 mg/dL   HDL 89 >91 mg/dL   VLDL Cholesterol Cal 20 5 - 40 mg/dL   LDL Calculated 478 (H) 0 - 99 mg/dL   Chol/HDL Ratio 2.5 0.0 - 4.4 ratio    Comment:  T. Chol/HDL Ratio                                             Men  Women                               1/2 Avg.Risk  3.4    3.3                                   Avg.Risk  5.0    4.4                                2X Avg.Risk  9.6    7.1                                3X Avg.Risk 23.4   11.0   TSH     Status: None   Collection Time: 12/31/16  1:53 PM  Result Value Ref Range   TSH 1.680 0.450 - 4.500 uIU/mL  VITAMIN D 25 Hydroxy (Vit-D Deficiency, Fractures)     Status: Abnormal   Collection Time: 12/31/16  1:53 PM  Result Value Ref Range   Vit D,  25-Hydroxy 11.1 (L) 30.0 - 100.0 ng/mL    Comment: Vitamin D deficiency has been defined by the Institute of Medicine and an Endocrine Society practice guideline as a level of serum 25-OH vitamin D less than 20 ng/mL (1,2). The Endocrine Society went on to further define vitamin D insufficiency as a level between 21 and 29 ng/mL (2). 1. IOM (Institute of Medicine). 2010. Dietary reference    intakes for calcium and D. [place]: The    [employer]. 2. Holick MF, Binkley Coin, Bischoff-Ferrari HA, et al.    Evaluation, treatment, and prevention of vitamin D    deficiency: an Endocrine Society clinical practice    guideline. JCEM. 2011 Jul; 96(7):1911-30.   Vitamin B12     Status: None   Collection Time: 12/31/16  1:53 PM  Result Value Ref Range   Vitamin B-12 346 232 - 1,245 pg/mL  Comprehensive metabolic panel     Status: Abnormal   Collection Time: 12/31/16  1:53 PM  Result Value Ref Range   Glucose 112 (H) 65 - 99 mg/dL   BUN 13 6 - 24 mg/dL   Creatinine, Ser 1.610.87 0.57 - 1.00 mg/dL   GFR calc non Af Amer 76 >59 mL/min/1.73   GFR calc Af Amer 87 >59 mL/min/1.73   BUN/Creatinine Ratio 15 9 - 23   Sodium 140 134 - 144 mmol/L   Potassium 3.8 3.5 - 5.2 mmol/L   Chloride 104 96 - 106 mmol/L   CO2 26 20 - 29 mmol/L   Calcium 9.3 8.7 - 10.2 mg/dL   Total Protein 7.2 6.0 - 8.5 g/dL   Albumin 4.3 3.5 - 5.5 g/dL   Globulin, Total 2.9 1.5 - 4.5 g/dL   Albumin/Globulin Ratio 1.5 1.2 - 2.2   Bilirubin Total <0.2 0.0 - 1.2 mg/dL   Alkaline Phosphatase 127 (H) 39 - 117 IU/L   AST 10 0 - 40 IU/L  ALT 8 0 - 32 IU/L      PHQ2/9: Depression screen [facility] 2/9 03/17/2017 12/30/2016 06/17/2016 12/18/2015 09/13/2015  Decreased Interest 0 0 0 0 0  Down, Depressed, Hopeless 0 0 0 0 0  PHQ - 2 Score 0 0 0 0 0     Fall Risk: Fall Risk  03/17/2017 12/30/2016 12/30/2016 06/17/2016 12/18/2015  Falls in the past year? Yes Yes No No No  Number falls in past yr: 2 or more 1 - - -  Injury  with Fall? No No - - -  Comment Right Ankle - - - -  Follow up - Education provided - - -     Functional Status Survey: Is the patient deaf or have difficulty hearing?: No Does the patient have difficulty seeing, even when wearing glasses/contacts?: No Does the patient have difficulty concentrating, remembering, or making decisions?: No Does the patient have difficulty walking or climbing stairs?: No Does the patient have difficulty dressing or bathing?: No Does the patient have difficulty doing errands alone such as visiting a doctor's office or shopping?: No   Assessment & Plan  1. Benign hypertension  At goal   2. Cerebral seizure  No recent episodes, under the care of neurologist   3. Morbid obesity, unspecified obesity type (HCC)  Losing weight, because she is depressed, however she does not want to be treated  4. Mild intermittent asthma without complication  FMLA forms filled out  5. Iron deficiency anemia, unspecified iron deficiency anemia type  Continue follow up with Dr. Orlie Dakin   6. Eczema, unspecified type  - Crisaborole (EUCRISA) 2 % OINT; Apply 1 g topically daily.  Dispense: 60 g; Refill: 2  7. Other allergic rhinitis  - fluticasone (FLONASE) 50 MCG/ACT nasal spray; Place 2 sprays into both nostrils daily.  Dispense: 48 g; Refill: 1  8. Vitamin D deficiency  - Vitamin D, Ergocalciferol, (DRISDOL) 50000 units CAPS capsule; Take 1 capsule by mouth  every 7 days  Dispense: 12 capsule; Refill: 1  9. Current moderate episode of major depressive disorder, unspecified whether recurrent (HCC)  She refuses medication, discussed counseling, but she also refused, she states she will speak to her pastor.

## 2017-03-17 NOTE — Patient Instructions (Signed)
Suicidal Feelings: How to Help Yourself  Suicide is the taking of one's own life. If you feel as though life is getting too tough to handle and are thinking about suicide, get help right away. To get help:  Call your local emergency services (911 in the [place]  Call a suicide hotline to speak with a trained counselor who understands how you are feeling. The following is a list of suicide hotlines in the [place]. For a list of hotlines in [place], visit www.suicide.org/hotlines/international/[place]-suicide-hotlines.html.  1-800-273-TALK (1-800-273-8255).  1-800-SUICIDE (1-800-784-2433).  1-888-628-9454. This is a hotline for Spanish speakers.  1-800-799-4TTY (1-800-799-4889). This is a hotline for TTY users.  1-866-4-U-TREVOR (1-866-488-7386). This is a hotline for lesbian, gay, bisexual, transgender, or questioning youth.  Contact a crisis center or a local suicide prevention center. To find a crisis center or suicide prevention center:  Call your local hospital, clinic, community service organization, mental health center, social service provider, or health department. Ask for assistance in connecting to a crisis center.  Visit www.suicidepreventionlifeline.org/getinvolved/locator for a list of crisis centers in the [place], or visit www.suicideprevention.ca/thinking-about-suicide/find-a-crisis-centre for a list of centers in [place].  Visit the following websites:  National Suicide Prevention Lifeline: www.suicidepreventionlifeline.org  Hopeline: www.hopeline.com  American Foundation for Suicide Prevention: www.afsp.org  The Trevor Project (for lesbian, gay, bisexual, transgender, or questioning youth): www.thetrevorproject.org    How can I help myself feel better?  Promise yourself that you will not do anything drastic when you have suicidal feelings. Remember, there is hope. Many people have gotten through suicidal thoughts and feelings, and you will, too. You may have gotten through them before, and  this proves that you can get through them again.  Let family, friends, teachers, or counselors know how you are feeling. Try not to isolate yourself from those who care about you. Remember, they will want to help you. Talk with someone every day, even if you do not feel sociable. Face-to-face conversation is best.  Call a mental health professional and see one regularly.  Visit your primary health care provider every year.  Eat a well-balanced diet, and space your meals so you eat regularly.  Get plenty of rest.  Avoid alcohol and drugs, and remove them from your home. They will only make you feel worse.  If you are thinking of taking a lot of medicine, give your medicine to someone who can give it to you one day at a time. If you are on antidepressants and are concerned you will overdose, let your health care provider know so he or she can give you safer medicines. Ask your mental health professional about the possible side effects of any medicines you are taking.  Remove weapons, poisons, knives, and anything else that could harm you from your home.  Try to stick to routines. Follow a schedule every day. Put self-care on your schedule.  Make a list of realistic goals, and cross them off when you achieve them. Accomplishments give a sense of worth.  Wait until you are feeling better before doing the things you find difficult or unpleasant.  Exercise if you are able. You will feel better if you exercise for even a half hour each day.  Go out in the sun or into nature. This will help you recover from depression faster. If you have a favorite place to walk, go there.  Do the things that have always given you pleasure. Play your favorite music, read a good book, paint a picture, play your favorite   instrument, or do anything else that takes your mind off your depression if it is safe to do.  Keep your living space well lit.  When you are feeling well, write yourself a letter about tips and support that you can read when  you are not feeling well.  Remember that life's difficulties can be sorted out with help. Conditions can be treated. You can work on thoughts and strategies that serve you well.  This information is not intended to replace advice given to you by your health care provider. Make sure you discuss any questions you have with your health care provider.  Document Released: 09/01/2002 Document Revised: 10/25/2015 Document Reviewed: 06/22/2013  Elsevier Interactive Patient Education  2018 Elsevier Inc.

## 2017-04-10 ENCOUNTER — Telehealth: Payer: Self-pay | Admitting: Family Medicine

## 2017-04-10 DIAGNOSIS — I10 Essential (primary) hypertension: Secondary | ICD-10-CM

## 2017-04-10 NOTE — Telephone Encounter (Signed)
Pt said that she just seen the dr and she is needing refill on her 2  blood pressure meds. She is out. [facility]

## 2017-04-11 MED ORDER — LOSARTAN POTASSIUM-HCTZ 100-12.5 MG PO TABS: 1.0000 | ORAL_TABLET | Freq: Every day | ORAL | 1 refills | Status: DC

## 2017-04-11 MED ORDER — AMLODIPINE BESYLATE 2.5 MG PO TABS: 2.5000 mg | ORAL_TABLET | Freq: Every day | ORAL | 1 refills | Status: DC

## 2017-04-14 ENCOUNTER — Other Ambulatory Visit: Payer: Self-pay | Admitting: Family Medicine

## 2017-04-14 DIAGNOSIS — I10 Essential (primary) hypertension: Secondary | ICD-10-CM

## 2017-04-14 MED ORDER — AMLODIPINE BESYLATE 2.5 MG PO TABS: 2.5000 mg | ORAL_TABLET | Freq: Every day | ORAL | 1 refills | Status: DC

## 2017-04-14 MED ORDER — LOSARTAN POTASSIUM-HCTZ 100-12.5 MG PO TABS: 1.0000 | ORAL_TABLET | Freq: Every day | ORAL | 1 refills | Status: DC

## 2017-04-14 NOTE — Telephone Encounter (Signed)
Hypertension medication request: 03/17/2017   Last office visit pertaining to hypertension:  BP Readings from Last 3 Encounters:  03/17/17 130/76  02/10/17 126/83  01/27/17 128/78    Lab Results  Component Value Date   CREATININE 0.87 12/31/2016   BUN 13 12/31/2016   NA 140 12/31/2016   K 3.8 12/31/2016   CL 104 12/31/2016   CO2 26 12/31/2016    Follow up on 06/16/2017

## 2017-04-14 NOTE — Telephone Encounter (Signed)
Copied from CRM [phone]. Topic: Quick Communication - See Telephone Encounter >> Apr 14, 2017  3:18 PM Floria RavelingStovall, Shana A wrote: CRM for notification. See Telephone encounter for: pt called in and said that she is out of all her bp meds.  I do see where there has been a message sent to dr.  Rock NephewPt stated that she needs it today.  [facility] [place]  04/14/17.

## 2017-06-16 ENCOUNTER — Ambulatory Visit: Payer: 59 | Admitting: Family Medicine

## 2017-06-23 ENCOUNTER — Encounter: Payer: Self-pay | Admitting: Nurse Practitioner

## 2017-06-23 ENCOUNTER — Ambulatory Visit: Payer: 59 | Admitting: Nurse Practitioner

## 2017-06-23 VITALS — BP 140/80 | HR 80 | Resp 16 | Ht 64.0 in | Wt 299.2 lb

## 2017-06-23 DIAGNOSIS — G4733 Obstructive sleep apnea (adult) (pediatric): Secondary | ICD-10-CM | POA: Diagnosis not present

## 2017-06-23 DIAGNOSIS — M19071 Primary osteoarthritis, right ankle and foot: Secondary | ICD-10-CM

## 2017-06-23 DIAGNOSIS — E559 Vitamin D deficiency, unspecified: Secondary | ICD-10-CM

## 2017-06-23 DIAGNOSIS — Z79899 Other long term (current) drug therapy: Secondary | ICD-10-CM

## 2017-06-23 DIAGNOSIS — I10 Essential (primary) hypertension: Secondary | ICD-10-CM | POA: Diagnosis not present

## 2017-06-23 DIAGNOSIS — E7800 Pure hypercholesterolemia, unspecified: Secondary | ICD-10-CM | POA: Diagnosis not present

## 2017-06-23 DIAGNOSIS — J301 Allergic rhinitis due to pollen: Secondary | ICD-10-CM

## 2017-06-23 DIAGNOSIS — J4520 Mild intermittent asthma, uncomplicated: Secondary | ICD-10-CM

## 2017-06-23 DIAGNOSIS — G40804 Other epilepsy, intractable, without status epilepticus: Secondary | ICD-10-CM

## 2017-06-23 DIAGNOSIS — R7303 Prediabetes: Secondary | ICD-10-CM | POA: Diagnosis not present

## 2017-06-23 LAB — POCT GLYCOSYLATED HEMOGLOBIN (HGB A1C): Hemoglobin A1C: 5.7

## 2017-06-23 MED ORDER — VITAMIN D (ERGOCALCIFEROL) 1.25 MG (50000 UNIT) PO CAPS: ORAL_CAPSULE | ORAL | 1 refills | Status: DC

## 2017-06-23 MED ORDER — MELOXICAM 15 MG PO TABS: 15.0000 mg | ORAL_TABLET | Freq: Every day | ORAL | 0 refills | Status: DC

## 2017-06-23 MED ORDER — LOSARTAN POTASSIUM-HCTZ 100-12.5 MG PO TABS: 1.0000 | ORAL_TABLET | Freq: Every day | ORAL | 1 refills | Status: DC

## 2017-06-23 MED ORDER — AMLODIPINE BESYLATE 5 MG PO TABS: 5.0000 mg | ORAL_TABLET | Freq: Every day | ORAL | 3 refills | Status: DC

## 2017-06-23 NOTE — Patient Instructions (Addendum)
- We have increased your dose of amlodipine from 2.5mg  to 5mg  a day - Please check you blood pressure every day this week when you are nice and calm and follow up in one week here for a nurse visit  - We have talked a bit what you've been eating... Increase WATER intake and cut down on sodas and tea, reduce salt - Start taking Vitamin D, and crestor again  - PLEASE get lab work done within the next 2 weeks.   _____ People who take nonsteroidal anti-inflammatory drugs (NSAIDs) (other than aspirin) such as Meloxicam Ibuprofen (Advil, Motrin), Naproxen (Aleve), celecoxib, diclofenac ect. may have a higher risk of having a heart attack or a stroke than people who do not take these medications. This risk may be higher for people who take NSAIDs for a long time. You are at higher risk if you have recently had a heart attack, or if you or anyone in your family has or has ever had heart disease, a heart attack, or a stroke, if you smoke, and if you have or have ever had high cholesterol, high blood pressure, or diabetes. Get emergency medical help right away if you experience any of the following symptoms: chest pain, shortness of breath, weakness in one part or side of the body, or slurred speech. If you experience any of the following symptoms, stop taking your NSAID and call us: severe stomach pain, heartburn, vomiting a substance that is bloody or looks like coffee grounds, blood in the stool, or black and tarry stools.   DASH Eating Plan DASH stands for "Dietary Approaches to Stop Hypertension." The DASH eating plan is a healthy eating plan that has been shown to reduce high blood pressure (hypertension). It may also reduce your risk for type 2 diabetes, heart disease, and stroke. The DASH eating plan may also help with weight loss. What are tips for following this plan? General guidelines  Avoid eating more than 2,300 mg (milligrams) of salt (sodium) a day. If you have hypertension, you may need to  reduce your sodium intake to 1,500 mg a day.  Limit alcohol intake to no more than 1 drink a day for nonpregnant women and 2 drinks a day for men. One drink equals 12 oz of beer, 5 oz of wine, or 1 oz of hard liquor.  Work with your health care provider to maintain a healthy body weight or to lose weight. Ask what an ideal weight is for you.  Get at least 30 minutes of exercise that causes your heart to beat faster (aerobic exercise) most days of the week. Activities may include walking, swimming, or biking.  Work with your health care provider or diet and nutrition specialist (dietitian) to adjust your eating plan to your individual calorie needs. Reading food labels  Check food labels for the amount of sodium per serving. Choose foods with less than 5 percent of the Daily Value of sodium. Generally, foods with less than 300 mg of sodium per serving fit into this eating plan.  To find whole grains, look for the word "whole" as the first word in the ingredient list. Shopping  Buy products labeled as "low-sodium" or "no salt added."  Buy fresh foods. Avoid canned foods and premade or frozen meals. Cooking  Avoid adding salt when cooking. Use salt-free seasonings or herbs instead of table salt or sea salt. Check with your health care provider or pharmacist before using salt substitutes.  Do not fry foods. Cook foods using  healthy methods such as baking, boiling, grilling, and broiling instead.  Cook with heart-healthy oils, such as olive, canola, soybean, or sunflower oil. Meal planning   Eat a balanced diet that includes: ? 5 or more servings of fruits and vegetables each day. At each meal, try to fill half of your plate with fruits and vegetables. ? Up to 6-8 servings of whole grains each day. ? Less than 6 oz of lean meat, poultry, or fish each day. A 3-oz serving of meat is about the same size as a deck of cards. One egg equals 1 oz. ? 2 servings of low-fat dairy each day. ? A  serving of nuts, seeds, or beans 5 times each week. ? Heart-healthy fats. Healthy fats called Omega-3 fatty acids are found in foods such as flaxseeds and coldwater fish, like sardines, salmon, and mackerel.  Limit how much you eat of the following: ? Canned or prepackaged foods. ? Food that is high in trans fat, such as fried foods. ? Food that is high in saturated fat, such as fatty meat. ? Sweets, desserts, sugary drinks, and other foods with added sugar. ? Full-fat dairy products.  Do not salt foods before eating.  Try to eat at least 2 vegetarian meals each week.  Eat more home-cooked food and less restaurant, buffet, and fast food.  When eating at a restaurant, ask that your food be prepared with less salt or no salt, if possible. What foods are recommended? The items listed may not be a complete list. Talk with your dietitian about what dietary choices are best for you. Grains Whole-grain or whole-wheat bread. Whole-grain or whole-wheat pasta. Brown rice. Orpah Cobb. Bulgur. Whole-grain and low-sodium cereals. Pita bread. Low-fat, low-sodium crackers. Whole-wheat flour tortillas. Vegetables Fresh or frozen vegetables (raw, steamed, roasted, or grilled). Low-sodium or reduced-sodium tomato and vegetable juice. Low-sodium or reduced-sodium tomato sauce and tomato paste. Low-sodium or reduced-sodium canned vegetables. Fruits All fresh, dried, or frozen fruit. Canned fruit in natural juice (without added sugar). Meat and other protein foods Skinless chicken or [place]. Ground chicken or [place]. Pork with fat trimmed off. Fish and seafood. Egg whites. Dried beans, peas, or lentils. Unsalted nuts, nut butters, and seeds. Unsalted canned beans. Lean cuts of beef with fat trimmed off. Low-sodium, lean deli meat. Dairy Low-fat (1%) or fat-free (skim) milk. Fat-free, low-fat, or reduced-fat cheeses. Nonfat, low-sodium ricotta or cottage cheese. Low-fat or nonfat yogurt. Low-fat,  low-sodium cheese. Fats and oils Soft margarine without trans fats. Vegetable oil. Low-fat, reduced-fat, or light mayonnaise and salad dressings (reduced-sodium). Canola, safflower, olive, soybean, and sunflower oils. Avocado. Seasoning and other foods Herbs. Spices. Seasoning mixes without salt. Unsalted popcorn and pretzels. Fat-free sweets. What foods are not recommended? The items listed may not be a complete list. Talk with your dietitian about what dietary choices are best for you. Grains Baked goods made with fat, such as croissants, muffins, or some breads. Dry pasta or rice meal packs. Vegetables Creamed or fried vegetables. Vegetables in a cheese sauce. Regular canned vegetables (not low-sodium or reduced-sodium). Regular canned tomato sauce and paste (not low-sodium or reduced-sodium). Regular tomato and vegetable juice (not low-sodium or reduced-sodium). Rosita Fire. Olives. Fruits Canned fruit in a light or heavy syrup. Fried fruit. Fruit in cream or butter sauce. Meat and other protein foods Fatty cuts of meat. Ribs. Fried meat. Tomasa Blase. Sausage. Bologna and other processed lunch meats. Salami. Fatback. Hotdogs. Bratwurst. Salted nuts and seeds. Canned beans with added salt. Canned or smoked fish.  Whole eggs or egg yolks. Chicken or [place] with skin. Dairy Whole or 2% milk, cream, and half-and-half. Whole or full-fat cream cheese. Whole-fat or sweetened yogurt. Full-fat cheese. Nondairy creamers. Whipped toppings. Processed cheese and cheese spreads. Fats and oils Butter. Stick margarine. Lard. Shortening. Ghee. Bacon fat. Tropical oils, such as coconut, palm kernel, or palm oil. Seasoning and other foods Salted popcorn and pretzels. Onion salt, garlic salt, seasoned salt, table salt, and sea salt. Worcestershire sauce. Tartar sauce. Barbecue sauce. Teriyaki sauce. Soy sauce, including reduced-sodium. Steak sauce. Canned and packaged gravies. Fish sauce. Oyster sauce. Cocktail sauce.  Horseradish that you find on the shelf. Ketchup. Mustard. Meat flavorings and tenderizers. Bouillon cubes. Hot sauce and Tabasco sauce. Premade or packaged marinades. Premade or packaged taco seasonings. Relishes. Regular salad dressings. Where to find more information:  National Heart, Lung, and Blood Institute: PopSteam.is  American Heart Association: www.heart.org Summary  The DASH eating plan is a healthy eating plan that has been shown to reduce high blood pressure (hypertension). It may also reduce your risk for type 2 diabetes, heart disease, and stroke.  With the DASH eating plan, you should limit salt (sodium) intake to 2,300 mg a day. If you have hypertension, you may need to reduce your sodium intake to 1,500 mg a day.  When on the DASH eating plan, aim to eat more fresh fruits and vegetables, whole grains, lean proteins, low-fat dairy, and heart-healthy fats.  Work with your health care provider or diet and nutrition specialist (dietitian) to adjust your eating plan to your individual calorie needs. This information is not intended to replace advice given to you by your health care provider. Make sure you discuss any questions you have with your health care provider. Document Released: 02/14/2011 Document Revised: 02/19/2016 Document Reviewed: 02/19/2016 Elsevier Interactive Patient Education  [employer].

## 2017-06-23 NOTE — Progress Notes (Addendum)
Name: Paula [name]   MRN: [id number]    DOB: 1960-12-13   Date:06/23/2017       Progress Note  Subjective  Chief Complaint  Chief Complaint  Patient presents with  . Medication Refill    3 month F/U  . Hypertension  . Asthma  . Allergic Rhinitis   . Sleep Apnea  . Seizure Disorder    HPI  Prediabetes. Patient states drinks pepsi and tea, doesn't drink. Patient eats at [employer], [employer]- fish place, likes sea food and sausage, egg biscuits, chinese foods. Patient does not like to cook. Eats spinach.   Hypertension Takes amlodipine 2.5mg  and losartan-HCTZ 100-12.5mg  daily. States has been eating a lot of pork and sausage Denies headaches, chest pain, blurry vision  Asthma Symbicort as needed when she starts getting a cough. Uses albuterol inhaler when wheezing.   Allergic Rhinitis  Has seasonal allergies states is triggered by "everything out there" states is not acting up right now because she stays in the house. Takes zyrtec and flonase as needed  Sleep Apnea Dr. Alessandra Bevels diagnosed her- has CPAP mask at home and wore it for a time period- lost weight and was retested and no longer had it so does not wear her CPAP  Seizure disorder  Follows up with Dr. Alessandra Bevels- neurology next appointment in on 4/25 she is on tegretol and keppra and topamax. Has not had a seizure in over 2 years.   Vitamin D deficiency  Takes vitamin D 50,000 2-5x a month   Patient Active Problem List   Diagnosis Date Noted  . Pre-diabetes 01/01/2017  . 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 Plantar fasciitis, right 06/17/2016  . Osteoarthritis of right foot 06/17/2016  . Primary osteoarthritis, right ankle and foot 09/13/2015  . Carpal tunnel syndrome, right 09/13/2015  . Tarsal tunnel syndrome of right side 09/13/2015  . 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 Asthma, mild intermittent 08/15/2014  . Benign hypertension 08/15/2014  .  Herpes genitalis in women 08/15/2014  . Allergic rhinitis 08/15/2014  . Cerebral seizure 08/15/2014  . Vitamin D deficiency 08/15/2014  . Morbid obesity (HCC) 08/03/2014  . Adhesive capsulitis of left shoulder 06/16/2013  . Obstructive sleep apnea of adult 06/16/2013  . Epilepsy (HCC) 06/16/2013  . Intractable epilepsy (HCC) 06/16/2013  . Anemia, iron deficiency 09/30/2006    Past Medical History:  Diagnosis Date  . Allergy   . Anemia   . Asthma   . Epilepsy (HCC)   . Hypertension     Past Surgical History:  Procedure Laterality Date  . 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 BREAST SURGERY Left    benign lump at age 86    Social History   Tobacco Use  . Smoking status: Never Smoker  . Smokeless tobacco: Never Used  Substance Use Topics  . Alcohol use: No    Alcohol/week: 0.0 oz     Current Outpatient Medications:  .  albuterol (PROVENTIL HFA;VENTOLIN HFA) 108 (90 Base) MCG/ACT inhaler, Inhale 2 puffs every 6 (six) hours as needed into the lungs., Disp: 1 Inhaler, Rfl: 0 .  aspirin EC 81 MG tablet, Take 1 tablet (81 mg total) by mouth daily., Disp: 30 tablet, Rfl: 0 .  budesonide-formoterol (SYMBICORT) 160-4.5 MCG/ACT inhaler, Inhale 2 puffs 2 (two) times daily into the lungs., Disp: 1 Inhaler, Rfl: 2 .  carbamazepine (TEGRETOL) 200 MG tablet, TAKE 1 AND 1/2  TABLETS BY  MOUTH IN THE MORNING AND  NOON AND 2 AND 1/2 TABLETS  BY MOUTH AT NIGHT, Disp: , Rfl:  .  cetirizine (ZYRTEC) 10 MG tablet, Take 1 tablet (10 mg total) by mouth daily., Disp: 30 tablet, Rfl: 1 .  Crisaborole (EUCRISA) 2 % OINT, Apply 1 g topically daily., Disp: 60 g, Rfl: 2 .  fluticasone (FLONASE) 50 MCG/ACT nasal spray, Place 2 sprays into both nostrils daily., Disp: 48 g, Rfl: 1 .  levETIRAcetam (KEPPRA) 500 MG tablet, Take 3 tablets by mouth 2 (two) times daily., Disp: , Rfl:  .  losartan-hydrochlorothiazide (HYZAAR) 100-12.5 MG tablet, Take 1 tablet by mouth daily., Disp: 90 tablet, Rfl:  1 .  meloxicam (MOBIC) 15 MG tablet, Take 1 tablet (15 mg total) by mouth daily., Disp: 90 tablet, Rfl: 0 .  rosuvastatin (CRESTOR) 10 MG tablet, Take 1 tablet (10 mg total) by mouth daily., Disp: 90 tablet, Rfl: 1 .  topiramate (TOPAMAX) 100 MG tablet, Take 3 tablets by mouth 3 (three) times daily., Disp: , Rfl:  .  triamcinolone ointment (KENALOG) 0.1 %, Apply 1 application topically 2 (two) times daily., Disp: 30 g, Rfl: 1 .  valACYclovir (VALTREX) 1000 MG tablet, Take 1 tablet (1,000 mg total) by mouth 2 (two) times daily., Disp: 20 tablet, Rfl: 0 .  Vitamin D, Ergocalciferol, (DRISDOL) 50000 units CAPS capsule, Take 1 capsule by mouth  every 7 days, Disp: 12 capsule, Rfl: 1 .  amLODipine (NORVASC) 5 MG tablet, Take 1 tablet (5 mg total) by mouth daily., Disp: 90 tablet, Rfl: 3  Allergies  Allergen Reactions  . Ace Inhibitors     ROS  No other specific complaints in a complete review of systems (except as listed in HPI above).  Objective  Vitals:   06/23/17 1359 06/23/17 1403  BP: 140/80 140/80  Pulse: 80   Resp: 16   SpO2: 98%   Weight: 299 lb 3.2 oz (135.7 kg)   Height: 5\' 4"  (1.626 m)      Body mass index is 51.36 kg/m.  Nursing Note and Vital Signs reviewed.  Physical Exam  Constitutional: Patient appears well-developed and well-nourished. Obese  No distress.  HEENT: head atraumatic, normocephalic, pupils equal and reactive to light,  oropharynx pink and moist without exudate, no nasal discharge, no thyromegaly Cardiovascular: Normal rate, regular rhythm, S1/S2 present.  No murmur or rub heard. No carotid bruits Pulmonary/Chest: Effort normal and breath sounds clear. No respiratory distress or retractions. Abdominal: Soft and non-tender, bowel sounds present  Psychiatric: Patient has a normal mood and affect. behavior is normal. Judgment and thought content normal.  Results for orders placed or performed in visit on 06/23/17 (from the past 72 hour(s))  POCT HgB  A1C     Status: Abnormal   Collection Time: 06/23/17  2:12 PM  Result Value Ref Range   Hemoglobin A1C 5.7     Assessment & Plan  1. Benign hypertension - elevated today; increased amlodipine, will get lab work done with [facility] and come in for bp recheck in 2 weeks - losartan-hydrochlorothiazide (HYZAAR) 100-12.5 MG tablet; Take 1 tablet by mouth daily.  Dispense: 90 tablet; Refill: 1 - amLODipine (NORVASC) 5 MG tablet; Take 1 tablet (5 mg total) by mouth daily.  Dispense: 90 tablet; Refill: 3  2. Pre-diabetes - discussed diet and exercise - POCT HgB A1C  3. Mild intermittent asthma without complication - stable; continue therapies  4. Seasonal allergic rhinitis due to pollen -  avoid triggers, OTC management as needed  5. Obstructive sleep apnea of adult - resolved with weight loss  6. 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 Morbid obesity (HCC) - discussed diet, exercise- patient states she will work on exercising more and using her PF gym access   8. Primary osteoarthritis of right foot - meloxicam (MOBIC) 15 MG tablet; Take 1 tablet (15 mg total) by mouth daily.  Dispense: 90 tablet; Refill: 0  9. Vitamin D deficiency  - Vitamin D, Ergocalciferol, (DRISDOL) 50000 units CAPS capsule; Take 1 capsule by mouth  every 7 days  Dispense: 12 capsule; Refill: 1  10. Primary osteoarthritis, right ankle and foot - tylenol  - meloxicam (MOBIC) 15 MG tablet; Take 1 tablet (15 mg total) by mouth daily.  Dispense: 90 tablet; Refill: 0  11. Medication management  - Comprehensive Metabolic Panel (CMET)  12. Pure hypercholesterolemia - restart statin; improve diet - Lipid Profile   -Red flags and when to present for emergency care or RTC including fever >101.88F, chest pain, shortness of breath, new/worsening/un-resolving symptoms,  reviewed with patient at time of visit. Follow up and care instructions discussed and  provided in AVS.  ---------------------------------------------------- I recommend checking the vitamin D now before proceeding to additional vitamin D Rx, and limiting vitamin D Rx to no more than 8-12 weeks at a time, then rechecking to see if more needed; vit D is a fat soluble vitamin and can become toxic  I have reviewed this encounter including the documentation in this note and/or discussed this patient with the provider, Sharyon CableElizabeth Acquanetta Cabanilla DNP AGNP-C. I am certifying that I agree with the content of this note as supervising physician. Baruch GoutyMelinda Lada, MD [facility] 06/25/2017, 7:59 PM

## 2017-06-26 ENCOUNTER — Other Ambulatory Visit: Payer: Self-pay | Admitting: Nurse Practitioner

## 2017-06-26 DIAGNOSIS — E559 Vitamin D deficiency, unspecified: Secondary | ICD-10-CM

## 2017-07-01 NOTE — Progress Notes (Signed)
Left message

## 2017-07-03 DIAGNOSIS — G4721 Circadian rhythm sleep disorder, delayed sleep phase type: Secondary | ICD-10-CM | POA: Diagnosis not present

## 2017-07-07 ENCOUNTER — Ambulatory Visit: Payer: 59

## 2017-07-07 ENCOUNTER — Other Ambulatory Visit: Payer: Self-pay | Admitting: Family Medicine

## 2017-07-07 VITALS — BP 138/84 | HR 72 | Temp 97.9°F | Resp 18 | Ht 64.0 in | Wt 299.0 lb

## 2017-07-07 DIAGNOSIS — Z1231 Encounter for screening mammogram for malignant neoplasm of breast: Secondary | ICD-10-CM

## 2017-07-07 DIAGNOSIS — E7800 Pure hypercholesterolemia, unspecified: Secondary | ICD-10-CM | POA: Diagnosis not present

## 2017-07-07 DIAGNOSIS — Z79899 Other long term (current) drug therapy: Secondary | ICD-10-CM | POA: Diagnosis not present

## 2017-07-07 DIAGNOSIS — I10 Essential (primary) hypertension: Secondary | ICD-10-CM

## 2017-07-07 DIAGNOSIS — Z7689 Persons encountering health services in other specified circumstances: Secondary | ICD-10-CM | POA: Diagnosis not present

## 2017-07-07 NOTE — Progress Notes (Signed)
Patient is here for a blood pressure check. Patient denies chest pain, palpitations, shortness of breath or visual disturbances. At previous visit blood pressure was 140/80 with a heart rate of 80. Today during nurse visit first check blood pressure was 138/84. After resting for 10 minutes it was 134/72 and heart rate was 72. She does take any blood pressure medications.   losartan-hydrochlorothiazide (HYZAAR) 100-12.5 MG tablet; Take 1 tablet by mouth daily.  Dispense: 90 tablet; Refill: 1 - amLODipine (NORVASC) 5 MG tablet; Take 1 tablet (5 mg total) by mouth daily.  Dispense: 90 tablet; Refill: 3  BP has been running normal at home and Neurologist office.

## 2017-07-08 ENCOUNTER — Encounter: Payer: Self-pay | Admitting: Nurse Practitioner

## 2017-07-08 LAB — COMPREHENSIVE METABOLIC PANEL
A/G RATIO: 1.3 (ref 1.2–2.2)
ALK PHOS: 132 IU/L — AB (ref 39–117)
ALT: 6 IU/L (ref 0–32)
AST: 11 IU/L (ref 0–40)
Albumin: 4 g/dL (ref 3.5–5.5)
BUN/Creatinine Ratio: 15 (ref 9–23)
BUN: 12 mg/dL (ref 6–24)
Bilirubin Total: <0.2 mg/dL (ref 0.0–1.2)
CHLORIDE: 106 mmol/L (ref 96–106)
CO2: 22 mmol/L (ref 20–29)
Calcium: 9.3 mg/dL (ref 8.7–10.2)
Creatinine, Ser: 0.82 mg/dL (ref 0.57–1.00)
GFR calc Af Amer: 94 mL/min/{1.73_m2} (ref >59)
GFR calc non Af Amer: 81 mL/min/{1.73_m2} (ref >59)
GLUCOSE: 84 mg/dL (ref 65–99)
Globulin, Total: 3 g/dL (ref 1.5–4.5)
POTASSIUM: 4.4 mmol/L (ref 3.5–5.2)
Sodium: 140 mmol/L (ref 134–144)
Total Protein: 7 g/dL (ref 6.0–8.5)

## 2017-07-08 LAB — LIPID PANEL
CHOL/HDL RATIO: 2.3 ratio (ref 0.0–4.4)
Cholesterol, Total: 194 mg/dL (ref 100–199)
HDL: 83 mg/dL (ref >39)
LDL CALC: 95 mg/dL (ref 0–99)
TRIGLYCERIDES: 82 mg/dL (ref 0–149)
VLDL Cholesterol Cal: 16 mg/dL (ref 5–40)

## 2017-07-09 ENCOUNTER — Telehealth: Payer: Self-pay | Admitting: Family Medicine

## 2017-07-09 NOTE — Telephone Encounter (Signed)
Copied from CRM [id number]. Topic: Quick Communication - Office Called Patient >> Jul 09, 2017  2:39 PM Gerrianne Scale wrote: Reason for CRM: office called pt about labs pt is returning call

## 2017-07-09 NOTE — Telephone Encounter (Signed)
Patient called.  Unable to reach patient. If patient calls back please inform her of her most recent lab results.  

## 2017-08-18 ENCOUNTER — Ambulatory Visit: Payer: 59 | Admitting: Oncology

## 2017-08-22 ENCOUNTER — Encounter: Payer: Self-pay | Admitting: Oncology

## 2017-08-22 DIAGNOSIS — D509 Iron deficiency anemia, unspecified: Secondary | ICD-10-CM | POA: Diagnosis not present

## 2017-08-23 ENCOUNTER — Other Ambulatory Visit: Payer: Self-pay | Admitting: Oncology

## 2017-08-24 NOTE — Progress Notes (Signed)
[facility]  [phone]) [phone] [phone]  ID: Paula [name] OB: May 15, 1960  MR#: [id number]  AOZ#:[id number]#:[id number]  Patient Care Team: Alba CorySowles, Krichna, MD as PCP - General (Family Medicine) Gwyneth RevelsFowler, Justin, DPM as Consulting Physician (Podiatry) Bonnee QuinVaughn, Bradley V, MD as Consulting Physician (Neurology) Alwyn Peaallwood, Dwayne D, MD as Consulting Physician (Cardiology)  CHIEF COMPLAINT: Iron deficiency anemia.  INTERVAL HISTORY: Patient returns to clinic today for discussion of her laboratory work and routine six-month evaluation.  She continues to have chronic weakness and fatigue which she blames on insomnia and "watching too much TV".  She is anxious regarding the recurrence of breast cancer in her mother.  She continues to work full-time, but is contemplating retirement in the near future. She has no neurologic complaints. She denies fever or chills. She denies any weight loss or weight gain. She denies any current chest pain or shortness of breath.  She denies any nausea, vomiting, constipation, or diarrhea. She has no urinary complaints.  Patient offers no further specific complaints today.  REVIEW OF SYSTEMS:   Review of Systems  Constitutional: Positive for malaise/fatigue. Negative for fever and weight loss.  Respiratory: Negative.  Negative for cough and shortness of breath.   Cardiovascular: Negative.  Negative for chest pain and leg swelling.  Gastrointestinal: Negative.  Negative for abdominal pain, blood in stool and melena.  Genitourinary: Negative.  Negative for dysuria.  Musculoskeletal: Negative.  Negative for back pain.  Skin: Negative.  Negative for rash.  Neurological: Positive for weakness. Negative for sensory change and focal weakness.  Psychiatric/Behavioral: The patient has insomnia. The patient is not nervous/anxious.     As per HPI. Otherwise, a complete review of systems is negative.  PAST MEDICAL HISTORY: Anemia, hypertension, seizure  disorder, sleep apnea, hypertension, left breast lumpectomy with Dr. Lemar LivingsByrnett. Colonoscopy in 2004.  PAST SURGICAL HISTORY: Past Surgical History:  Procedure Laterality Date  . BREAST EXCISIONAL BIOPSY Left    age 57 tumor removed  . BREAST SURGERY Left    benign lump at age 57    FAMILY HISTORY Family History  Problem Relation Age of Onset  . Cancer Mother        Breast  . Hypertension Mother   . Diabetes Mother   . Hyperlipidemia Mother   . Breast cancer Mother        ADVANCED DIRECTIVES:    HEALTH MAINTENANCE: Social History   Tobacco Use  . Smoking status: Never Smoker  . Smokeless tobacco: Never Used  Substance Use Topics  . Alcohol use: No    Alcohol/week: 0.0 oz  . Drug use: No     Allergies  Allergen Reactions  . Ace Inhibitors     Current Outpatient Medications  Medication Sig Dispense Refill  . albuterol (PROVENTIL HFA;VENTOLIN HFA) 108 (90 Base) MCG/ACT inhaler Inhale 2 puffs every 6 (six) hours as needed into the lungs. 1 Inhaler 0  . amLODipine (NORVASC) 5 MG tablet Take 1 tablet (5 mg total) by mouth daily. 90 tablet 3  . aspirin EC 81 MG tablet Take 1 tablet (81 mg total) by mouth daily. 30 tablet 0  . budesonide-formoterol (SYMBICORT) 160-4.5 MCG/ACT inhaler Inhale 2 puffs 2 (two) times daily into the lungs. 1 Inhaler 2  . carbamazepine (TEGRETOL) 200 MG tablet TAKE 1 AND 1/2 TABLETS BY  MOUTH IN THE MORNING AND  NOON AND 2 AND 1/2 TABLETS  BY MOUTH AT NIGHT    . cetirizine (ZYRTEC) 10 MG tablet Take 1 tablet (10  mg total) by mouth daily. 30 tablet 1  . Crisaborole (EUCRISA) 2 % OINT Apply 1 g topically daily. 60 g 2  . fluticasone (FLONASE) 50 MCG/ACT nasal spray Place 2 sprays into both nostrils daily. 48 g 1  . levETIRAcetam (KEPPRA) 500 MG tablet Take 3 tablets by mouth 2 (two) times daily.    Marland Kitchen losartan-hydrochlorothiazide (HYZAAR) 100-12.5 MG tablet Take 1 tablet by mouth daily. 90 tablet 1  . meloxicam (MOBIC) 15 MG tablet Take 1 tablet  (15 mg total) by mouth daily. 90 tablet 0  . rosuvastatin (CRESTOR) 10 MG tablet Take 1 tablet (10 mg total) by mouth daily. 90 tablet 1  . topiramate (TOPAMAX) 100 MG tablet Take 3 tablets by mouth 3 (three) times daily.    Marland Kitchen triamcinolone ointment (KENALOG) 0.1 % Apply 1 application topically 2 (two) times daily. 30 g 1  . valACYclovir (VALTREX) 1000 MG tablet Take 1 tablet (1,000 mg total) by mouth 2 (two) times daily. 20 tablet 0  . Vitamin D, Ergocalciferol, (DRISDOL) 50000 units CAPS capsule Take 1 capsule by mouth  every 7 days 12 capsule 1   No current facility-administered medications for this visit.     OBJECTIVE: Vitals:   08/25/17 1000  BP: 129/84  Pulse: 78  Resp: 18  Temp: 98.1 F (36.7 C)     Body mass index is 50.77 kg/m.    ECOG FS:0 - Asymptomatic  General: Well-developed, well-nourished, no acute distress. Eyes: Pink conjunctiva, anicteric sclera. Lungs: Clear to auscultation bilaterally. Heart: Regular rate and rhythm. No rubs, murmurs, or gallops. Abdomen: Soft, nontender, nondistended. No organomegaly noted, normoactive bowel sounds. Musculoskeletal: No edema, cyanosis, or clubbing. Neuro: Alert, answering all questions appropriately. Cranial nerves grossly intact. Skin: No rashes or petechiae noted. Psych: Normal affect.  LAB RESULTS:  Lab Results  Component Value Date   NA 140 07/07/2017   K 4.4 07/07/2017   CL 106 07/07/2017   CO2 22 07/07/2017   GLUCOSE 84 07/07/2017   BUN 12 07/07/2017   CREATININE 0.82 07/07/2017   CALCIUM 9.3 07/07/2017   PROT 7.0 07/07/2017   ALBUMIN 4.0 07/07/2017   AST 11 07/07/2017   ALT 6 07/07/2017   ALKPHOS 132 (H) 07/07/2017   BILITOT <0.2 07/07/2017   GFRNONAA 81 07/07/2017   GFRAA 94 07/07/2017    Lab Results  Component Value Date   WBC 3.3 (L) 12/31/2016   NEUTROABS 2.0 12/31/2016   HGB 10.6 (L) 12/31/2016   HCT 33.0 (L) 12/31/2016   MCV 83 12/31/2016   PLT 246 12/31/2016     STUDIES: Mm 3d  Screen Breast Bilateral  Result Date: 08/27/2017 CLINICAL DATA:  Screening. EXAM: DIGITAL SCREENING BILATERAL MAMMOGRAM WITH TOMO AND CAD COMPARISON:  Previous exam(s). ACR Breast Density Category b: There are scattered areas of fibroglandular density. FINDINGS: There are no findings suspicious for malignancy. Images were processed with CAD. IMPRESSION: No mammographic evidence of malignancy. A result letter of this screening mammogram will be mailed directly to the patient. RECOMMENDATION: Screening mammogram in one year. (Code:SM-B-01Y) BI-RADS CATEGORY  1: Negative. Electronically Signed   By: Ted Mcalpine M.D.   On: 08/27/2017 13:30    ASSESSMENT: Iron deficiency anemia.  PLAN:    1. Iron deficiency anemia: Patient's most recent hemoglobin is 10.6, which is approximately her baseline.  Her iron stores continue to be within normal limits.  Previously, the entire work-up was either negative or within normal limits.  Patient gets all of her lab work  done at [facility]. Her last IV iron was in June 2017. She does not require feraheme today.  Return to clinic in 6 months with repeat laboratory work and further evaluation. 2.  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 3.  Insomnia: Recommended patient discuss with her primary care physician to improve sleep habits.  Patient expressed understanding and was in agreement with this plan. She also understands that She can call clinic at any time with any questions, concerns, or complaints.    Jeralyn Ruths, MD 08/29/17 5:39 PM

## 2017-08-25 ENCOUNTER — Inpatient Hospital Stay: Payer: 59 | Attending: Oncology | Admitting: Oncology

## 2017-08-25 ENCOUNTER — Other Ambulatory Visit: Payer: Self-pay

## 2017-08-25 VITALS — BP 129/84 | HR 78 | Temp 98.1°F | Resp 18 | Wt 295.8 lb

## 2017-08-25 DIAGNOSIS — D509 Iron deficiency anemia, unspecified: Secondary | ICD-10-CM

## 2017-08-25 DIAGNOSIS — D72819 Decreased white blood cell count, unspecified: Secondary | ICD-10-CM

## 2017-08-25 DIAGNOSIS — G4700 Insomnia, unspecified: Secondary | ICD-10-CM

## 2017-08-25 NOTE — Progress Notes (Signed)
Here for follow up. Stated stays awake all night and watches TV - " Im always tired / I sleep a lot " caring for mom

## 2017-08-27 ENCOUNTER — Ambulatory Visit
Admission: RE | Admit: 2017-08-27 | Discharge: 2017-08-27 | Disposition: A | Discharge status: Home / Self Care | Payer: 59 | Source: Ambulatory Visit | Attending: Family Medicine | Admitting: Family Medicine

## 2017-08-27 DIAGNOSIS — Z1231 Encounter for screening mammogram for malignant neoplasm of breast: Secondary | ICD-10-CM | POA: Diagnosis not present

## 2017-11-11 ENCOUNTER — Telehealth: Payer: Self-pay

## 2017-11-11 NOTE — Telephone Encounter (Signed)
Copied from CRM [id number]. Topic: Quick Communication - Rx Refill/Question >> Nov 11, 2017 10:14 AM Baldo Daub L wrote: Medication:   Vincenza Hews from [facility] calling to state that losartan-hydrochlorothiazide (HYZAAR) 100-12.5 MG tablet is on back order. They are separating the RX out into losartan pills and hydrochlorothizaide pills. Vincenza Hews can be reached at [phone]

## 2017-11-13 ENCOUNTER — Other Ambulatory Visit: Payer: Self-pay | Admitting: Family Medicine

## 2017-11-13 MED ORDER — TELMISARTAN-HCTZ 80-12.5 MG PO TABS: 1.0000 | ORAL_TABLET | Freq: Every day | ORAL | 0 refills | Status: DC

## 2017-11-13 NOTE — Telephone Encounter (Signed)
It seems like they were just notifying us that they were splitting medication. You can call and confirm. You may also need to call patient and see if she would like to change to micardis hctz to stay on one pill daily

## 2017-11-13 NOTE — Telephone Encounter (Signed)
Spoke with patient and she would like to go back on combination versus the split dosage.

## 2018-01-12 ENCOUNTER — Encounter: Payer: 59 | Admitting: Family Medicine

## 2018-01-27 ENCOUNTER — Other Ambulatory Visit (HOSPITAL_COMMUNITY)
Admission: RE | Admit: 2018-01-27 | Discharge: 2018-01-27 | Disposition: A | Discharge status: Home / Self Care | Payer: 59 | Source: Ambulatory Visit | Attending: Nurse Practitioner | Admitting: Nurse Practitioner

## 2018-01-27 ENCOUNTER — Ambulatory Visit (INDEPENDENT_AMBULATORY_CARE_PROVIDER_SITE_OTHER): Payer: 59 | Admitting: Nurse Practitioner

## 2018-01-27 ENCOUNTER — Encounter: Payer: Self-pay | Admitting: Nurse Practitioner

## 2018-01-27 VITALS — BP 130/80 | HR 95 | Temp 98.2°F | Resp 16 | Ht 64.0 in | Wt 291.7 lb

## 2018-01-27 DIAGNOSIS — Z124 Encounter for screening for malignant neoplasm of cervix: Secondary | ICD-10-CM | POA: Insufficient documentation

## 2018-01-27 DIAGNOSIS — Z23 Encounter for immunization: Secondary | ICD-10-CM

## 2018-01-27 DIAGNOSIS — Z0000 Encounter for general adult medical examination without abnormal findings: Secondary | ICD-10-CM | POA: Diagnosis not present

## 2018-01-27 NOTE — Progress Notes (Signed)
Name: Paula [name]   MRN: [id number]    DOB: Jan 10, 1961   Date:01/27/2018       Progress Note  Subjective  Chief Complaint  Chief Complaint  Patient presents with  . Annual Exam    HPI   Patient presents for annual CPE.  Diet:  Breakfast platter- sausage, hashbrown and gravy and pepsi and bottle water Lunch: chicafila sandwich and lemonade Dinner: soup- broccoli cream and cheddar- drank water Drinks 1.5 bottles of water  Exercise:  Works on her feet at [facility]; has feet and ankle pain.  No regular activity. Has membership and plant fitness too exhausted to go.   USPSTF grade A and B recommendations    Office Visit from 01/27/2018 in [facility]  AUDIT-C Score  0     Depression:  Depression screen [facility] 2/9 01/27/2018 06/23/2017 03/17/2017 03/17/2017 12/30/2016  Decreased Interest 0 0 3 0 0  Down, Depressed, Hopeless 0 0 3 0 0  PHQ - 2 Score 0 0 6 0 0  Altered sleeping - - 3 - -  Tired, decreased energy - - 3 - -  Change in appetite - - 3 - -  Feeling bad or failure about yourself  - - 3 - -  Trouble concentrating - - 0 - -  Moving slowly or fidgety/restless - - 0 - -  Suicidal thoughts - - 0 - -  PHQ-9 Score - - 18 - -  Difficult doing work/chores - - Very difficult - -   Hypertension: BP Readings from Last 3 Encounters:  01/27/18 130/80  08/25/17 129/84  07/07/17 138/84   Obesity: Wt Readings from Last 3 Encounters:  01/27/18 291 lb 11.2 oz (132.3 kg)  08/25/17 295 lb 12.8 oz (134.2 kg)  07/07/17 299 lb (135.6 kg)   BMI Readings from Last 3 Encounters:  01/27/18 50.07 kg/m  08/25/17 50.77 kg/m  07/07/17 51.32 kg/m    Hep C Screening: negative in 2017  STD testing and prevention (HIV/chl/gon/syphilis): declines Sexual History/Pain during Intercourse: not sexually active Menstrual History/LMP/Abnormal Bleeding: denies Incontinence Symptoms: endorses; does not want to see specialist.    Breast cancer:  HM Mammogram  Date Value  Ref Range Status  05/16/2014 Normal  Final     Cervical cancer screening: due today  Osteoporosis Screening:  No results found for: HMDEXASCAN  Lipids:  Lab Results  Component Value Date   CHOL 194 07/07/2017   CHOL 220 (H) 12/31/2016   CHOL 196 09/18/2015   Lab Results  Component Value Date   HDL 83 07/07/2017   HDL 89 12/31/2016   HDL 92 09/18/2015   Lab Results  Component Value Date   LDLCALC 95 07/07/2017   LDLCALC 111 (H) 12/31/2016   LDLCALC 87 09/18/2015   Lab Results  Component Value Date   TRIG 82 07/07/2017   TRIG 99 12/31/2016   TRIG 86 09/18/2015   Lab Results  Component Value Date   CHOLHDL 2.3 07/07/2017   CHOLHDL 2.5 12/31/2016   CHOLHDL 2.1 09/18/2015   No results found for: LDLDIRECT  Glucose:  Glucose  Date Value Ref Range Status  07/07/2017 84 65 - 99 mg/dL Final  72/53/6644 034 (H) 65 - 99 mg/dL Final  74/25/9563 89 65 - 99 mg/dL Final  87/56/4332 97 65 - 99 mg/dL Final  95/18/8416 93 65 - 99 mg/dL Final    Skin cancer: no personal history. Discussed.  Colorectal cancer:  Up to date, next due 6 years; Last  report noted A few small mouthed diverticula and internal hemorrhoids diverticula and internal hemorrhoids.  Lung cancer:   Patient does not qualify.     Patient Active Problem List   Diagnosis Date Noted  . 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 Pre-diabetes 01/01/2017  . 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 Plantar fasciitis, right 06/17/2016  . Osteoarthritis of right foot 06/17/2016  . Primary osteoarthritis, right ankle and foot 09/13/2015  . Carpal tunnel syndrome, right 09/13/2015  . Tarsal tunnel syndrome of right side 09/13/2015  . 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 Asthma, mild intermittent 08/15/2014  . Benign hypertension 08/15/2014  . Herpes genitalis in women 08/15/2014  . Allergic rhinitis 08/15/2014  . Cerebral seizure 08/15/2014  . Vitamin D  deficiency 08/15/2014  . Morbid obesity (HCC) 08/03/2014  . Adhesive capsulitis of left shoulder 06/16/2013  . Obstructive sleep apnea of adult 06/16/2013  . Epilepsy (HCC) 06/16/2013  . Intractable epilepsy (HCC) 06/16/2013  . Anemia, iron deficiency 09/30/2006    Past Surgical History:  Procedure Laterality Date  . BREAST EXCISIONAL BIOPSY Left    age 61 tumor removed  . BREAST SURGERY Left    benign lump at age 17    Family History  Problem Relation Age of Onset  . Cancer Mother        Breast  . Hypertension Mother   . Diabetes Mother   . Hyperlipidemia Mother   . Breast cancer Mother     Social History   Socioeconomic History  . Marital status: Single    Spouse name: Not on file  . Number of children: 0  . Years of education: Not on file  . Highest education level: Not on file  Occupational History  . Occupation: lab specialist    Employer: [employer]  Social Needs  . Financial resource strain: Not on file  . Food insecurity:    Worry: Not on file    Inability: Not on file  . Transportation needs:    Medical: Not on file    Non-medical: Not on file  Tobacco Use  . Smoking status: Never Smoker  . Smokeless tobacco: Never Used  Substance and Sexual Activity  . Alcohol use: No    Alcohol/week: 0.0 standard drinks  . Drug use: No  . Sexual activity: Not Currently  Lifestyle  . Physical activity:    Days per week: Not on file    Minutes per session: Not on file  . Stress: Not on file  Relationships  . Social connections:    Talks on phone: Not on file    Gets together: Not on file    Attends religious service: Not on file    Active member of club or organization: Not on file    Attends meetings of clubs or organizations: Not on file    Relationship status: Not on file  . Intimate partner violence:    Fear of current or ex partner: Not on file    Emotionally abused: Not on file    Physically abused: Not on file    Forced sexual activity: Not on file   Other Topics Concern  . Not on file  Social History Narrative   Lives with mother and boyfriend   Never had children    Works for [employer]      Current Outpatient Medications:  .  albuterol (PROVENTIL HFA;VENTOLIN HFA) 108 (90 Base) MCG/ACT inhaler, Inhale 2 puffs every  6 (six) hours as needed into the lungs., Disp: 1 Inhaler, Rfl: 0 .  amLODipine (NORVASC) 5 MG tablet, Take 1 tablet (5 mg total) by mouth daily., Disp: 90 tablet, Rfl: 3 .  aspirin EC 81 MG tablet, Take 1 tablet (81 mg total) by mouth daily., Disp: 30 tablet, Rfl: 0 .  budesonide-formoterol (SYMBICORT) 160-4.5 MCG/ACT inhaler, Inhale 2 puffs 2 (two) times daily into the lungs., Disp: 1 Inhaler, Rfl: 2 .  carbamazepine (TEGRETOL) 200 MG tablet, TAKE 1 AND 1/2 TABLETS BY  MOUTH IN THE MORNING AND  NOON AND 2 AND 1/2 TABLETS  BY MOUTH AT NIGHT, Disp: , Rfl:  .  cetirizine (ZYRTEC) 10 MG tablet, Take 1 tablet (10 mg total) by mouth daily., Disp: 30 tablet, Rfl: 1 .  Crisaborole (EUCRISA) 2 % OINT, Apply 1 g topically daily., Disp: 60 g, Rfl: 2 .  fluticasone (FLONASE) 50 MCG/ACT nasal spray, Place 2 sprays into both nostrils daily., Disp: 48 g, Rfl: 1 .  levETIRAcetam (KEPPRA) 500 MG tablet, Take 3 tablets by mouth 2 (two) times daily., Disp: , Rfl:  .  meloxicam (MOBIC) 15 MG tablet, Take 1 tablet (15 mg total) by mouth daily., Disp: 90 tablet, Rfl: 0 .  rosuvastatin (CRESTOR) 10 MG tablet, Take 1 tablet (10 mg total) by mouth daily., Disp: 90 tablet, Rfl: 1 .  telmisartan-hydrochlorothiazide (MICARDIS HCT) 80-12.5 MG tablet, Take 1 tablet by mouth daily., Disp: 90 tablet, Rfl: 0 .  topiramate (TOPAMAX) 100 MG tablet, Take 3 tablets by mouth 3 (three) times daily., Disp: , Rfl:  .  triamcinolone ointment (KENALOG) 0.1 %, Apply 1 application topically 2 (two) times daily., Disp: 30 g, Rfl: 1 .  valACYclovir (VALTREX) 1000 MG tablet, Take 1 tablet (1,000 mg total) by mouth 2 (two) times daily., Disp: 20 tablet, Rfl: 0 .  Vitamin  D, Ergocalciferol, (DRISDOL) 50000 units CAPS capsule, Take 1 capsule by mouth  every 7 days, Disp: 12 capsule, Rfl: 1  Allergies  Allergen Reactions  . Ace Inhibitors      Review of Systems  Constitutional: Negative for chills, fever and malaise/fatigue.  HENT: Negative for congestion, sinus pain and sore throat.   Eyes: Negative for blurred vision.  Respiratory: Negative for cough and shortness of breath.   Cardiovascular: Negative for chest pain, palpitations and leg swelling.  Gastrointestinal: Negative for abdominal pain, constipation, diarrhea and nausea.  Genitourinary: Negative for dysuria.  Musculoskeletal: Positive for joint pain (bilateral ankles). Negative for falls (no recent ones, onces in the summer when walking down the steps).  Skin: Negative for rash.  Neurological: Negative for dizziness, tingling, focal weakness and headaches.  Endo/Heme/Allergies: Negative for polydipsia.  Psychiatric/Behavioral: The patient is not nervous/anxious and does not have insomnia.     Objective  Vitals:   01/27/18 1402  BP: 130/80  Pulse: 95  Resp: 16  Temp: 98.2 F (36.8 C)  TempSrc: Oral  SpO2: 99%  Weight: 291 lb 11.2 oz (132.3 kg)  Height: 5\' 4"  (1.626 m)    Body mass index is 50.07 kg/m.  Physical Exam  Constitutional: Patient appears well-developed and well-nourished. No distress.  HENT: Head: Normocephalic and atraumatic. Ears: B TMs ok, no erythema or effusion; Nose: Nose normal. Mouth/Throat: Oropharynx is clear and moist. No oropharyngeal exudate.  Eyes: Conjunctivae and EOM are normal. Pupils are equal, round, and reactive to light. No scleral icterus.  Neck: Normal range of motion. Neck supple. No JVD present. No thyromegaly present.  Cardiovascular: Normal  rate, regular rhythm and normal heart sounds.  No murmur heard. No BLE edema. Pulmonary/Chest: Effort normal and breath sounds normal. No respiratory distress. Abdominal: Soft. Bowel sounds are normal, no  distension. There is no tenderness. no masses Breast: no lumps or masses, no nipple discharge or rashes FEMALE GENITALIA:  External genitalia normal External urethra normal Vaginal vault normal without discharge or lesions Unable to visualize cervix to large body habitus  Bimanual exam normal without masses Musculoskeletal: limited range of motion in bilateral legs due to body habitus, no joint effusions. No gross deformities Neurological: he is alert and oriented to person, place, and time. No cranial nerve deficit. Coordination, balance, strength, speech and gait are normal.  Skin: Skin is warm and dry. No rash noted. No erythema.  Psychiatric: Patient has a normal mood and affect. behavior is normal. Judgment and thought content normal.   No results found for this or any previous visit (from the past 2160 hour(s)).    Fall Risk: Fall Risk  01/27/2018 06/23/2017 03/17/2017 12/30/2016 12/30/2016  Falls in the past year? 1 No Yes Yes No  Number falls in past yr: 0 - 2 or more 1 -  Injury with Fall? 1 - No No -  Comment - - Right Ankle - -  Follow up - - - Education provided -    Assessment & Plan  1. Preventative health care Discussed diet improvements and increasing activity.  - Vitamin D (25 hydroxy) - CBC with Differential - Comprehensive Metabolic Panel (CMET)  2. Screening for cervical cancer Patient informed of difficult visualization.  - Cytology - PAP  3. Need for influenza vaccination - Flu Vaccine QUAD 36+ mos IM  -USPSTF grade A and B recommendations reviewed with patient; age-appropriate recommendations, preventive care, screening tests, etc discussed and encouraged; healthy living encouraged; see AVS for patient education given to patient -Discussed importance of 150 minutes of physical activity weekly, eat two servings of fish weekly, eat one serving of tree nuts ( cashews, pistachios, pecans, almonds.Marland Kitchen) every other day, eat 6 servings of fruit/vegetables daily  and drink plenty of water and avoid sweet beverages.

## 2018-01-27 NOTE — Patient Instructions (Addendum)
-   breakfast should be biggest meal - Small dinner - More fruits and colorful vegetables (greens) - Drink at least 4 bottles of water - Try going to the gym once a week and then slowly increasing that  General recommendations: 150 minutes of physical activity weekly, eat two servings of fish weekly, eat one serving of tree nuts ( cashews, pistachios, pecans, almonds.Marland Kitchen.) every other day, eat 6 servings of fruit/vegetables daily and drink plenty of water and avoid sweet beverages.

## 2018-01-28 LAB — CYTOLOGY - PAP
ADEQUACY: ABSENT
BACTERIAL VAGINITIS: NEGATIVE
CANDIDA VAGINITIS: NEGATIVE
Diagnosis: NEGATIVE
TRICH (WINDOWPATH): NEGATIVE

## 2018-01-29 ENCOUNTER — Other Ambulatory Visit: Payer: Self-pay

## 2018-01-29 DIAGNOSIS — I10 Essential (primary) hypertension: Secondary | ICD-10-CM

## 2018-01-29 MED ORDER — TELMISARTAN-HCTZ 80-12.5 MG PO TABS: 1.0000 | ORAL_TABLET | Freq: Every day | ORAL | 0 refills | Status: DC

## 2018-01-29 NOTE — Telephone Encounter (Signed)
Refill request for Hypertension medication:  Telmisartan-Hydrochlorothiazide 80-12.5 mg  Last office visit pertaining to hypertension: 06/23/2017  BP Readings from Last 3 Encounters:  01/27/18 130/80  08/25/17 129/84  07/07/17 138/84     Lab Results  Component Value Date   CREATININE 0.82 07/07/2017   BUN 12 07/07/2017   NA 140 07/07/2017   K 4.4 07/07/2017   CL 106 07/07/2017   CO2 22 07/07/2017   Follow-ups on file. 04/29/2018

## 2018-02-02 DIAGNOSIS — Z0000 Encounter for general adult medical examination without abnormal findings: Secondary | ICD-10-CM | POA: Diagnosis not present

## 2018-02-02 DIAGNOSIS — D509 Iron deficiency anemia, unspecified: Secondary | ICD-10-CM | POA: Diagnosis not present

## 2018-02-03 ENCOUNTER — Other Ambulatory Visit: Payer: Self-pay | Admitting: Nurse Practitioner

## 2018-02-03 ENCOUNTER — Other Ambulatory Visit: Payer: Self-pay

## 2018-02-03 ENCOUNTER — Telehealth: Payer: Self-pay | Admitting: Family Medicine

## 2018-02-03 DIAGNOSIS — D509 Iron deficiency anemia, unspecified: Secondary | ICD-10-CM

## 2018-02-03 DIAGNOSIS — E559 Vitamin D deficiency, unspecified: Secondary | ICD-10-CM

## 2018-02-03 LAB — CBC WITH DIFFERENTIAL/PLATELET
BASOS: 1 %
Basophils Absolute: 0 10*3/uL (ref 0.0–0.2)
EOS (ABSOLUTE): 0.1 10*3/uL (ref 0.0–0.4)
EOS: 4 %
HEMATOCRIT: 29.8 % — AB (ref 34.0–46.6)
HEMOGLOBIN: 9.6 g/dL — AB (ref 11.1–15.9)
IMMATURE GRANS (ABS): 0 10*3/uL (ref 0.0–0.1)
Immature Granulocytes: 0 %
LYMPHS: 36 %
Lymphocytes Absolute: 1 10*3/uL (ref 0.7–3.1)
MCH: 26.2 pg — AB (ref 26.6–33.0)
MCHC: 32.2 g/dL (ref 31.5–35.7)
MCV: 81 fL (ref 79–97)
MONOCYTES: 8 %
Monocytes Absolute: 0.2 10*3/uL (ref 0.1–0.9)
NEUTROS ABS: 1.4 10*3/uL (ref 1.4–7.0)
NEUTROS PCT: 51 %
Platelets: 210 10*3/uL (ref 150–450)
RBC: 3.66 x10E6/uL — ABNORMAL LOW (ref 3.77–5.28)
RDW: 14.2 % (ref 12.3–15.4)
WBC: 2.7 10*3/uL — ABNORMAL LOW (ref 3.4–10.8)

## 2018-02-03 LAB — COMPREHENSIVE METABOLIC PANEL
ALBUMIN: 4.1 g/dL (ref 3.5–5.5)
ALK PHOS: 133 IU/L — AB (ref 39–117)
ALT: 7 IU/L (ref 0–32)
AST: 8 IU/L (ref 0–40)
Albumin/Globulin Ratio: 1.4 (ref 1.2–2.2)
BUN/Creatinine Ratio: 17 (ref 9–23)
BUN: 14 mg/dL (ref 6–24)
CO2: 22 mmol/L (ref 20–29)
CREATININE: 0.82 mg/dL (ref 0.57–1.00)
Calcium: 9.3 mg/dL (ref 8.7–10.2)
Chloride: 105 mmol/L (ref 96–106)
GFR calc non Af Amer: 81 mL/min/{1.73_m2} (ref >59)
GFR, EST AFRICAN AMERICAN: 93 mL/min/{1.73_m2} (ref >59)
GLOBULIN, TOTAL: 3 g/dL (ref 1.5–4.5)
GLUCOSE: 105 mg/dL — AB (ref 65–99)
Potassium: 3.9 mmol/L (ref 3.5–5.2)
SODIUM: 141 mmol/L (ref 134–144)
TOTAL PROTEIN: 7.1 g/dL (ref 6.0–8.5)

## 2018-02-03 LAB — VITAMIN D 25 HYDROXY (VIT D DEFICIENCY, FRACTURES): Vit D, 25-Hydroxy: 7.4 ng/mL — ABNORMAL LOW (ref 30.0–100.0)

## 2018-02-03 MED ORDER — VITAMIN D (ERGOCALCIFEROL) 1.25 MG (50000 UNIT) PO CAPS: ORAL_CAPSULE | ORAL | 1 refills | Status: DC

## 2018-02-03 NOTE — Telephone Encounter (Signed)
Copied from CRM [phone]. Topic: General - Other >> Feb 03, 2018  2:46 PM Ronney LionArrington, Shykila A wrote: Reason for CRM: Pt called in wanting to have a nurse call her back in regards to her lab results. NT was busy.

## 2018-02-03 NOTE — Telephone Encounter (Signed)
Pt called to get results.  Copied from CRM [phone]. Topic: Quick Communication - Lab Results (Clinic Use ONLY) >> Feb 03, 2018  1:50 PM Tommie RaymondBooker, Crystal L, CMA wrote: Patient called.  Unable to reach patient. If patient calls back please inform her of her most recent lab results.

## 2018-02-03 NOTE — Telephone Encounter (Signed)
Labs need to be sent to [facility]. Fe+TIBC+Fer. Please sign order. Placed verbal order to [facility].

## 2018-02-03 NOTE — Telephone Encounter (Signed)
Called to add labs on to her order. [facility] states that these labs were placed yesterday by another doctor.

## 2018-02-03 NOTE — Telephone Encounter (Signed)
Charted in result notes. 

## 2018-02-04 ENCOUNTER — Encounter: Payer: Self-pay | Admitting: Oncology

## 2018-02-11 ENCOUNTER — Telehealth: Payer: Self-pay

## 2018-02-11 DIAGNOSIS — E559 Vitamin D deficiency, unspecified: Secondary | ICD-10-CM

## 2018-02-11 NOTE — Telephone Encounter (Signed)
-----   Message from Cheryle HorsfallElizabeth E Poulose, NP sent at 02/11/2018  9:56 AM EST ----- Please call _____________ Just needs vitamin D recheck after completion of Vitamin D course. Please order

## 2018-02-11 NOTE — Telephone Encounter (Signed)
Patient aware. Please sign orders.

## 2018-02-22 NOTE — Progress Notes (Signed)
[facility]  [phone] [phone]  ID: Paula [name] OB: 11/23/1960  MR#: [id number]  AOZ#:[id number]  Patient Care Team: Alba Cory, MD as PCP - General (Family Medicine) Gwyneth Revels, DPM as Consulting Physician (Podiatry) Bonnee Quin, MD as Consulting Physician (Neurology) Alwyn Pea, MD as Consulting Physician (Cardiology)  CHIEF COMPLAINT: Iron deficiency anemia.  INTERVAL HISTORY: Patient returns to clinic today for routine 87-month evaluation, discussion of her laboratory work, and consideration of IV iron.  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  She otherwise feels well. She continues to work full-time, but is contemplating retirement in the near future. She has no neurologic complaints. She denies fever or chills.  She reports intentional weight loss.  She denies any chest pain or shortness of breath.  She denies any nausea, vomiting, constipation, or diarrhea. She has no urinary complaints.  Patient offers no further specific complaints today.  REVIEW OF SYSTEMS:   Review of Systems  Constitutional: Positive for malaise/fatigue. Negative for fever and weight loss.  Respiratory: Negative.  Negative for cough and shortness of breath.   Cardiovascular: Negative.  Negative for chest pain and leg swelling.  Gastrointestinal: Negative.  Negative for abdominal pain, blood in stool and melena.  Genitourinary: Negative.  Negative for dysuria.  Musculoskeletal: Negative.  Negative for back pain.  Skin: Negative.  Negative for rash.  Neurological: Positive for weakness. Negative for sensory change and focal weakness.  Psychiatric/Behavioral: The patient has insomnia. The patient is not nervous/anxious.     As per HPI. Otherwise, a complete review of systems is negative.  PAST MEDICAL HISTORY: Anemia, hypertension, seizure disorder, sleep  apnea, hypertension, left breast lumpectomy with Dr. Lemar Livings. Colonoscopy in 2004.  PAST SURGICAL HISTORY: Past Surgical History:  Procedure Laterality Date  . BREAST EXCISIONAL BIOPSY Left    age 49 tumor removed  . BREAST SURGERY Left    benign lump at age 59    FAMILY HISTORY Family History  Problem Relation Age of Onset  . Cancer Mother        Breast  . Hypertension Mother   . Diabetes Mother   . Hyperlipidemia Mother   . Breast cancer Mother        ADVANCED DIRECTIVES:    HEALTH MAINTENANCE: Social History   Tobacco Use  . Smoking status: Never Smoker  . Smokeless tobacco: Never Used  Substance Use Topics  . Alcohol use: No    Alcohol/week: 0.0 standard drinks  . Drug use: No     Allergies  Allergen Reactions  . Ace Inhibitors     Current Outpatient Medications  Medication Sig Dispense Refill  . albuterol (PROVENTIL HFA;VENTOLIN HFA) 108 (90 Base) MCG/ACT inhaler Inhale 2 puffs every 6 (six) hours as needed into the lungs. 1 Inhaler 0  . amLODipine (NORVASC) 5 MG tablet Take 1 tablet (5 mg total) by mouth daily. 90 tablet 3  . aspirin EC 81 MG tablet Take 1 tablet (81 mg total) by mouth daily. 30 tablet 0  . budesonide-formoterol (SYMBICORT) 160-4.5 MCG/ACT inhaler Inhale 2 puffs 2 (two) times daily into the lungs. 1 Inhaler 2  . carbamazepine (TEGRETOL) 200 MG tablet TAKE 1 AND 1/2 TABLETS BY  MOUTH IN THE MORNING AND  NOON AND 2 AND 1/2 TABLETS  BY MOUTH AT NIGHT    . cetirizine (ZYRTEC) 10 MG tablet Take 1 tablet (10 mg  total) by mouth daily. 30 tablet 1  . Crisaborole (EUCRISA) 2 % OINT Apply 1 g topically daily. 60 g 2  . fluticasone (FLONASE) 50 MCG/ACT nasal spray Place 2 sprays into both nostrils daily. 48 g 1  . levETIRAcetam (KEPPRA) 500 MG tablet Take 3 tablets by mouth 2 (two) times daily.    . meloxicam (MOBIC) 15 MG tablet Take 1 tablet (15 mg total) by mouth daily. 90 tablet 0  . rosuvastatin (CRESTOR) 10 MG tablet Take 1 tablet (10 mg  total) by mouth daily. 90 tablet 1  . telmisartan-hydrochlorothiazide (MICARDIS HCT) 80-12.5 MG tablet Take 1 tablet by mouth daily. 90 tablet 0  . topiramate (TOPAMAX) 100 MG tablet Take 3 tablets by mouth 3 (three) times daily.    Marland Kitchen. triamcinolone ointment (KENALOG) 0.1 % Apply 1 application topically 2 (two) times daily. 30 g 1  . valACYclovir (VALTREX) 1000 MG tablet Take 1 tablet (1,000 mg total) by mouth 2 (two) times daily. 20 tablet 0  . Vitamin D, Ergocalciferol, (DRISDOL) 1.25 MG (50000 UT) CAPS capsule Take 1 capsule by mouth  every 7 days 8 capsule 1   No current facility-administered medications for this visit.     OBJECTIVE: Vitals:   02/23/18 1316  BP: 131/79  Pulse: 81  Temp: 97.7 F (36.5 C)     Body mass index is 50.09 kg/m.    ECOG FS:0 - Asymptomatic  General: Obese, no acute distress. Eyes: Pink conjunctiva, anicteric sclera. HEENT: Normocephalic, moist mucous membranes. Lungs: Clear to auscultation bilaterally. Heart: Regular rate and rhythm. No rubs, murmurs, or gallops. Abdomen: Soft, nontender, nondistended. No organomegaly noted, normoactive bowel sounds. Musculoskeletal: No edema, cyanosis, or clubbing. Neuro: Alert, answering all questions appropriately. Cranial nerves grossly intact. Skin: No rashes or petechiae noted. Psych: Normal affect.  LAB RESULTS:  Lab Results  Component Value Date   NA 141 02/02/2018   K 3.9 02/02/2018   CL 105 02/02/2018   CO2 22 02/02/2018   GLUCOSE 105 (H) 02/02/2018   BUN 14 02/02/2018   CREATININE 0.82 02/02/2018   CALCIUM 9.3 02/02/2018   PROT 7.1 02/02/2018   ALBUMIN 4.1 02/02/2018   AST 8 02/02/2018   ALT 7 02/02/2018   ALKPHOS 133 (H) 02/02/2018   BILITOT <0.2 02/02/2018   GFRNONAA 81 02/02/2018   GFRAA 93 02/02/2018    Lab Results  Component Value Date   WBC 2.7 (L) 02/02/2018   NEUTROABS 1.4 02/02/2018   HGB 9.6 (L) 02/02/2018   HCT 29.8 (L) 02/02/2018   MCV 81 02/02/2018   PLT 210 02/02/2018       STUDIES: No results found.  ASSESSMENT: Iron deficiency anemia.  PLAN:    1. Iron deficiency anemia: Patient's most recent hemoglobin has trended down and is now 9.6.  Her iron stores are slightly decreased as well.  Patient is also symptomatic. Previously, the entire work-up was either negative or within normal limits.  Proceed with 510 mg IV Feraheme today.  Return to clinic in 6 months with repeat laboratory work and further evaluation.  Patient receives all of her laboratory work at [facility].   2.  Leukopenia: Chronic and unchanged. 3.  Insomnia: Chronic and unchanged.  Previously recommended patient discuss with her primary care physician to improve sleep habits.  I spent a total of 30 minutes face-to-face with the patient of which greater than 50% of the visit was spent in counseling and coordination of care as detailed above.   Patient expressed understanding  and was in agreement with this plan. She also understands that She can call clinic at any time with any questions, concerns, or complaints.    Jeralyn Ruths, MD 02/25/18 6:26 AM

## 2018-02-23 ENCOUNTER — Inpatient Hospital Stay: Payer: 59

## 2018-02-23 ENCOUNTER — Inpatient Hospital Stay: Payer: 59 | Attending: Oncology | Admitting: Oncology

## 2018-02-23 ENCOUNTER — Other Ambulatory Visit: Payer: Self-pay

## 2018-02-23 VITALS — BP 110/73 | HR 73 | Resp 20

## 2018-02-23 VITALS — BP 131/79 | HR 81 | Temp 97.7°F | Wt 291.8 lb

## 2018-02-23 DIAGNOSIS — D509 Iron deficiency anemia, unspecified: Secondary | ICD-10-CM | POA: Diagnosis not present

## 2018-02-23 DIAGNOSIS — I10 Essential (primary) hypertension: Secondary | ICD-10-CM | POA: Insufficient documentation

## 2018-02-23 DIAGNOSIS — G4700 Insomnia, unspecified: Secondary | ICD-10-CM | POA: Insufficient documentation

## 2018-02-23 DIAGNOSIS — Z79899 Other long term (current) drug therapy: Secondary | ICD-10-CM | POA: Diagnosis not present

## 2018-02-23 DIAGNOSIS — Z8249 Family history of ischemic heart disease and other diseases of the circulatory system: Secondary | ICD-10-CM | POA: Diagnosis not present

## 2018-02-23 DIAGNOSIS — D72819 Decreased white blood cell count, unspecified: Secondary | ICD-10-CM | POA: Diagnosis not present

## 2018-02-23 DIAGNOSIS — Z791 Long term (current) use of non-steroidal anti-inflammatories (NSAID): Secondary | ICD-10-CM | POA: Diagnosis not present

## 2018-02-23 DIAGNOSIS — Z7982 Long term (current) use of aspirin: Secondary | ICD-10-CM | POA: Diagnosis not present

## 2018-02-23 DIAGNOSIS — R5383 Other fatigue: Secondary | ICD-10-CM | POA: Insufficient documentation

## 2018-02-23 DIAGNOSIS — R531 Weakness: Secondary | ICD-10-CM | POA: Diagnosis not present

## 2018-02-23 MED ORDER — SODIUM CHLORIDE 0.9 % IV SOLN
Freq: Once | INTRAVENOUS | Status: AC
  Administered 2018-02-23: 14:00:00 via INTRAVENOUS
  Filled 2018-02-23: qty 250

## 2018-02-23 MED ORDER — SODIUM CHLORIDE 0.9 % IV SOLN
510.0000 mg | Freq: Once | INTRAVENOUS | Status: AC
  Administered 2018-02-23: 510 mg via INTRAVENOUS
  Filled 2018-02-23: qty 17

## 2018-02-23 NOTE — Progress Notes (Signed)
Patient is here today to follow up on her anemia. Patient stated that she stays tired.

## 2018-04-16 ENCOUNTER — Other Ambulatory Visit: Payer: Self-pay | Admitting: Nurse Practitioner

## 2018-04-16 DIAGNOSIS — I10 Essential (primary) hypertension: Secondary | ICD-10-CM

## 2018-04-16 DIAGNOSIS — E559 Vitamin D deficiency, unspecified: Secondary | ICD-10-CM

## 2018-04-17 ENCOUNTER — Emergency Department: Payer: 59

## 2018-04-17 ENCOUNTER — Emergency Department
Admission: EM | Admit: 2018-04-17 | Discharge: 2018-04-17 | Disposition: A | Discharge status: Home / Self Care | Payer: 59 | Attending: Emergency Medicine | Admitting: Emergency Medicine

## 2018-04-17 ENCOUNTER — Other Ambulatory Visit: Payer: Self-pay

## 2018-04-17 DIAGNOSIS — R079 Chest pain, unspecified: Secondary | ICD-10-CM | POA: Diagnosis not present

## 2018-04-17 DIAGNOSIS — Z7982 Long term (current) use of aspirin: Secondary | ICD-10-CM | POA: Insufficient documentation

## 2018-04-17 DIAGNOSIS — J45909 Unspecified asthma, uncomplicated: Secondary | ICD-10-CM | POA: Diagnosis not present

## 2018-04-17 DIAGNOSIS — Z79899 Other long term (current) drug therapy: Secondary | ICD-10-CM | POA: Insufficient documentation

## 2018-04-17 DIAGNOSIS — R569 Unspecified convulsions: Secondary | ICD-10-CM | POA: Diagnosis not present

## 2018-04-17 DIAGNOSIS — I10 Essential (primary) hypertension: Secondary | ICD-10-CM | POA: Diagnosis not present

## 2018-04-17 LAB — BASIC METABOLIC PANEL
Anion gap: 8 (ref 5–15)
BUN: 16 mg/dL (ref 6–20)
CHLORIDE: 105 mmol/L (ref 98–111)
CO2: 25 mmol/L (ref 22–32)
Calcium: 9.1 mg/dL (ref 8.9–10.3)
Creatinine, Ser: 0.7 mg/dL (ref 0.44–1.00)
GFR calc Af Amer: >60 mL/min (ref >60)
GFR calc non Af Amer: >60 mL/min (ref >60)
Glucose, Bld: 99 mg/dL (ref 70–99)
POTASSIUM: 3.4 mmol/L — AB (ref 3.5–5.1)
Sodium: 138 mmol/L (ref 135–145)

## 2018-04-17 LAB — TROPONIN I: Troponin I: <0.03 ng/mL (ref <0.03)

## 2018-04-17 LAB — CBC
HCT: 31.8 % — ABNORMAL LOW (ref 36.0–46.0)
Hemoglobin: 9.9 g/dL — ABNORMAL LOW (ref 12.0–15.0)
MCH: 27.3 pg (ref 26.0–34.0)
MCHC: 31.1 g/dL (ref 30.0–36.0)
MCV: 87.8 fL (ref 80.0–100.0)
NRBC: 0 % (ref 0.0–0.2)
Platelets: 220 10*3/uL (ref 150–400)
RBC: 3.62 MIL/uL — AB (ref 3.87–5.11)
RDW: 14.2 % (ref 11.5–15.5)
WBC: 3.9 10*3/uL — ABNORMAL LOW (ref 4.0–10.5)

## 2018-04-17 MED ORDER — ALUM & MAG HYDROXIDE-SIMETH 200-200-20 MG/5ML PO SUSP
30.0000 mL | Freq: Once | ORAL | Status: AC
  Administered 2018-04-17: 30 mL via ORAL
  Filled 2018-04-17: qty 30

## 2018-04-17 MED ORDER — LIDOCAINE VISCOUS HCL 2 % MT SOLN
15.0000 mL | Freq: Once | OROMUCOSAL | Status: AC
  Administered 2018-04-17: 15 mL via ORAL
  Filled 2018-04-17: qty 15

## 2018-04-17 MED ORDER — SODIUM CHLORIDE 0.9% FLUSH: 3.0000 mL | Freq: Once | INTRAVENOUS | Status: DC

## 2018-04-17 NOTE — ED Provider Notes (Signed)
[facility] Emergency Department Provider Note  Time seen: 3:51 PM  I have reviewed the triage vital signs and the nursing notes.   HISTORY  Chief Complaint Chest Pain   HPI Paula [name] is a 58 y.o. female with a past medical history of anemia, asthma, hypertension, epilepsy, presents to the emergency department for chest discomfort.  According to the patient since this morning she has been feeling mild central chest discomfort.  Patient does state a history of reflux but has not had reflux issues many years.  Denies any shortness of breath nausea or diaphoresis.  Denies any abdominal pain.  Patient was concerned so she came to the emergency department for evaluation.   Past Medical History:  Diagnosis Date  . Allergy   . Anemia   . Asthma   . Epilepsy (HCC)   . Hypertension     Patient Active Problem List   Diagnosis Date Noted  . 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 Pre-diabetes 01/01/2017  . 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 Plantar fasciitis, right 06/17/2016  . Osteoarthritis of right foot 06/17/2016  . Primary osteoarthritis, right ankle and foot 09/13/2015  . Carpal tunnel syndrome, right 09/13/2015  . Tarsal tunnel syndrome of right side 09/13/2015  . 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 Asthma, mild intermittent 08/15/2014  . Benign hypertension 08/15/2014  . Herpes genitalis in women 08/15/2014  . Allergic rhinitis 08/15/2014  . Cerebral seizure 08/15/2014  . Vitamin D deficiency 08/15/2014  . Morbid obesity (HCC) 08/03/2014  . Adhesive capsulitis of left shoulder 06/16/2013  . Obstructive sleep apnea of adult 06/16/2013  . Epilepsy (HCC) 06/16/2013  . Intractable epilepsy (HCC) 06/16/2013  . Anemia, iron deficiency 09/30/2006    Past Surgical History:  Procedure Laterality Date  . BREAST EXCISIONAL BIOPSY Left    age 48 tumor removed  . BREAST SURGERY Left     benign lump at age 23    Prior to Admission medications   Medication Sig Start Date End Date Taking? Authorizing Provider  albuterol (PROVENTIL HFA;VENTOLIN HFA) 108 (90 Base) MCG/ACT inhaler Inhale 2 puffs every 6 (six) hours as needed into the lungs. 01/27/17   Alba Cory, MD  amLODipine (NORVASC) 5 MG tablet Take 1 tablet (5 mg total) by mouth daily. 06/23/17   Poulose, Percell Belt, NP  aspirin EC 81 MG tablet Take 1 tablet (81 mg total) by mouth daily. 12/30/16   Alba Cory, MD  budesonide-formoterol (SYMBICORT) 160-4.5 MCG/ACT inhaler Inhale 2 puffs 2 (two) times daily into the lungs. 01/27/17   Alba Cory, MD  carbamazepine (TEGRETOL) 200 MG tablet TAKE 1 AND 1/2 TABLETS BY  MOUTH IN THE MORNING AND  NOON AND 2 AND 1/2 TABLETS  BY MOUTH AT NIGHT 06/16/17   [provider]  cetirizine (ZYRTEC) 10 MG tablet Take 1 tablet (10 mg total) by mouth daily. 10/08/16   Doren Custard, FNP  Crisaborole (EUCRISA) 2 % OINT Apply 1 g topically daily. 03/17/17   Alba Cory, MD  fluticasone (FLONASE) 50 MCG/ACT nasal spray Place 2 sprays into both nostrils daily. 03/17/17   Alba Cory, MD  levETIRAcetam (KEPPRA) 500 MG tablet Take 3 tablets by mouth 2 (two) times daily. 02/12/08   Bonnee Quin, MD  meloxicam (MOBIC) 15 MG tablet Take 1 tablet (15 mg total) by mouth daily. 06/23/17   Poulose, Percell Belt, NP  rosuvastatin (CRESTOR) 10 MG tablet Take  1 tablet (10 mg total) by mouth daily. 01/01/17   Alba CorySowles, Krichna, MD  telmisartan-hydrochlorothiazide (MICARDIS HCT) 80-12.5 MG tablet Take 1 tablet by mouth daily. 01/29/18   Poulose, Percell BeltElizabeth E, NP  topiramate (TOPAMAX) 100 MG tablet Take 3 tablets by mouth 3 (three) times daily. 09/30/06   Bonnee QuinVaughn, Bradley V, MD  triamcinolone ointment (KENALOG) 0.1 % Apply 1 application topically 2 (two) times daily. 10/08/16   Doren CustardBoyce, Emily E, FNP  valACYclovir (VALTREX) 1000 MG tablet Take 1 tablet (1,000 mg total) by mouth 2 (two) times daily.  12/30/16   Alba CorySowles, Krichna, MD  Vitamin D, Ergocalciferol, (DRISDOL) 1.25 MG (50000 UT) CAPS capsule Take 1 capsule by mouth  every 7 days 02/03/18   Cheryle HorsfallPoulose, Elizabeth E, NP    Allergies  Allergen Reactions  . Ace Inhibitors     Family History  Problem Relation Age of Onset  . Cancer Mother        Breast  . Hypertension Mother   . Diabetes Mother   . Hyperlipidemia Mother   . Breast cancer Mother     Social History Social History   Tobacco Use  . Smoking status: Never Smoker  . Smokeless tobacco: Never Used  Substance Use Topics  . Alcohol use: No    Alcohol/week: 0.0 standard drinks  . Drug use: No    Review of Systems Constitutional: Negative for fever. Cardiovascular: Mild central chest discomfort Respiratory: Negative for shortness of breath. Gastrointestinal: Negative for abdominal pain, vomiting  Genitourinary: Negative for urinary compaints Musculoskeletal: Negative for musculoskeletal complaints Skin: Negative for skin complaints  Neurological: Negative for headache All other ROS negative  ____________________________________________   PHYSICAL EXAM:  VITAL SIGNS: ED Triage Vitals  Enc Vitals Group     BP 04/17/18 1502 124/90     Pulse Rate 04/17/18 1502 70     Resp 04/17/18 1502 16     Temp 04/17/18 1502 98.2 F (36.8 C)     Temp Source 04/17/18 1502 Oral     SpO2 04/17/18 1502 100 %     Weight 04/17/18 1458 295 lb (133.8 kg)     Height 04/17/18 1458 5\' 4"  (1.626 m)     Head Circumference --      Peak Flow --      Pain Score 04/17/18 1458 7     Pain Loc --      Pain Edu? --      Excl. in GC? --    Constitutional: Alert and oriented. Well appearing and in no distress. Eyes: Normal exam ENT   Head: Normocephalic and atraumatic.   Mouth/Throat: Mucous membranes are moist. Cardiovascular: Normal rate, regular rhythm. No murmur Respiratory: Normal respiratory effort without tachypnea nor retractions. Breath sounds are  clear Gastrointestinal: Soft and nontender. No distention.   Musculoskeletal: Nontender with normal range of motion in all extremities Neurologic:  Normal speech and language. No gross focal neurologic deficits Skin:  Skin is warm, dry and intact.  Psychiatric: Mood and affect are normal.   ____________________________________________    EKG  EKG viewed and interpreted by myself shows a sinus rhythm at 66 bpm with a narrow QRS, normal axis, normal intervals, no concerning ST changes.  ____________________________________________    RADIOLOGY  Chest x-ray negative  ____________________________________________   INITIAL IMPRESSION / ASSESSMENT AND PLAN / ED COURSE  Pertinent labs & imaging results that were available during my care of the patient were reviewed by me and considered in my medical decision making (see chart  for details).  Presents to the emergency department for mild central chest discomfort since 8:00 this morning.  Denies any shortness of breath nausea or diaphoresis.  Differential this time would include ACS, chest wall pain, esophagitis, gastritis or reflux.  We will check labs, chest x-ray, cardiac enzymes.  Given the history of reflux we will also dose a GI cocktail monitor for symptom improvement.  Patient states very minimal chest discomfort currently.  Patient's labs are reassuring, troponin negative, chest x-ray negative, EKG is reassuring.  Patient states she feels much better after GI cocktail denies any symptoms at this time.  Patient thinks that the pain could be because "I ate too fast."  Regardless I discussed return precautions for the patient for any worsening chest pain shortness of breath nausea or sweatiness, I also discussed follow-up with her cardiologist Dr. Juliann Paresallwood.  ____________________________________________   FINAL CLINICAL IMPRESSION(S) / ED DIAGNOSES  Chest pain   Minna AntisPaduchowski, Rivka Baune, MD 04/17/18 1657

## 2018-04-17 NOTE — ED Triage Notes (Addendum)
Pt to ED via EMS from work c/o chest pain that started this am. Pt states she ate steak and eggs for breakfast and the pain started after that. Denies radiation, SOB, lightheaded. Pt took 325asprin at work. Pt c/o sinus drainage. Pt walkin garound room on arrival. NAD. VSS.

## 2018-04-17 NOTE — Discharge Instructions (Signed)
You have been seen in the emergency department for chest discomfort, your work-up has shown normal results.  As we discussed please use over-the-counter liquid Maalox as needed for the next several days after eating and just before lying down.  If you have return of chest pain especially with any shortness of breath nausea or sweatiness please return to the emergency department for evaluation otherwise please follow-up with your cardiologist.

## 2018-04-23 DIAGNOSIS — I208 Other forms of angina pectoris: Secondary | ICD-10-CM | POA: Diagnosis not present

## 2018-04-23 DIAGNOSIS — I10 Essential (primary) hypertension: Secondary | ICD-10-CM | POA: Diagnosis not present

## 2018-04-23 DIAGNOSIS — R0602 Shortness of breath: Secondary | ICD-10-CM | POA: Diagnosis not present

## 2018-04-25 ENCOUNTER — Emergency Department
Admission: EM | Admit: 2018-04-25 | Discharge: 2018-04-25 | Disposition: A | Discharge status: Home / Self Care | Payer: 59 | Attending: Emergency Medicine | Admitting: Emergency Medicine

## 2018-04-25 ENCOUNTER — Emergency Department: Payer: 59

## 2018-04-25 ENCOUNTER — Other Ambulatory Visit: Payer: Self-pay

## 2018-04-25 DIAGNOSIS — K2970 Gastritis, unspecified, without bleeding: Secondary | ICD-10-CM | POA: Insufficient documentation

## 2018-04-25 DIAGNOSIS — I10 Essential (primary) hypertension: Secondary | ICD-10-CM | POA: Diagnosis not present

## 2018-04-25 DIAGNOSIS — Z79899 Other long term (current) drug therapy: Secondary | ICD-10-CM | POA: Diagnosis not present

## 2018-04-25 DIAGNOSIS — J45909 Unspecified asthma, uncomplicated: Secondary | ICD-10-CM | POA: Diagnosis not present

## 2018-04-25 DIAGNOSIS — R0902 Hypoxemia: Secondary | ICD-10-CM | POA: Diagnosis not present

## 2018-04-25 DIAGNOSIS — R079 Chest pain, unspecified: Secondary | ICD-10-CM | POA: Diagnosis present

## 2018-04-25 DIAGNOSIS — R000 Tachycardia, unspecified: Secondary | ICD-10-CM | POA: Diagnosis not present

## 2018-04-25 LAB — BASIC METABOLIC PANEL
ANION GAP: 8 (ref 5–15)
BUN: 12 mg/dL (ref 6–20)
CO2: 26 mmol/L (ref 22–32)
Calcium: 9.3 mg/dL (ref 8.9–10.3)
Chloride: 102 mmol/L (ref 98–111)
Creatinine, Ser: 0.8 mg/dL (ref 0.44–1.00)
GFR calc Af Amer: >60 mL/min (ref >60)
GFR calc non Af Amer: >60 mL/min (ref >60)
Glucose, Bld: 108 mg/dL — ABNORMAL HIGH (ref 70–99)
Potassium: 3.4 mmol/L — ABNORMAL LOW (ref 3.5–5.1)
Sodium: 136 mmol/L (ref 135–145)

## 2018-04-25 LAB — CBC
HCT: 37.4 % (ref 36.0–46.0)
HEMOGLOBIN: 11.7 g/dL — AB (ref 12.0–15.0)
MCH: 27.4 pg (ref 26.0–34.0)
MCHC: 31.3 g/dL (ref 30.0–36.0)
MCV: 87.6 fL (ref 80.0–100.0)
Platelets: 278 10*3/uL (ref 150–400)
RBC: 4.27 MIL/uL (ref 3.87–5.11)
RDW: 13.8 % (ref 11.5–15.5)
WBC: 4.7 10*3/uL (ref 4.0–10.5)
nRBC: 0 % (ref 0.0–0.2)

## 2018-04-25 LAB — TROPONIN I: Troponin I: <0.03 ng/mL (ref <0.03)

## 2018-04-25 MED ORDER — SODIUM CHLORIDE 0.9% FLUSH: 3.0000 mL | Freq: Once | INTRAVENOUS | Status: DC

## 2018-04-25 NOTE — Discharge Instructions (Addendum)
Please seek medical attention for any high fevers, chest pain, shortness of breath, change in behavior, persistent vomiting, bloody stool or any other new or concerning symptoms.  

## 2018-04-25 NOTE — ED Provider Notes (Signed)
[facility] Emergency Department Provider Note  ____________________________________________   I have reviewed the triage vital signs and the nursing notes.   HISTORY  Chief Complaint Chest Pain   History limited by: Not Limited   HPI Paula [name] is a 58 y.o. female who presents to the emergency department today because of concerns for continued chest pain.  Patient states that it started a little over 1 week ago.  She was seen in the emergency department 1 week ago and was treated for gastritis.  She states she followed up with cardiology and it sounds like they also thought the patient was likely suffering from gastritis.  Patient states that her pain is continued.  Does not sound like patient has done any dietary changes.  She has been taking her medication which does help from time to time.  Today had another episode of pain at work.  She denies any fevers.    Per medical record review patient has a history of recent ER visit for chest pain.   Past Medical History:  Diagnosis Date  . Allergy   . Anemia   . Asthma   . Epilepsy (HCC)   . Hypertension     Patient Active Problem List   Diagnosis Date Noted  . 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 Pre-diabetes 01/01/2017  . 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 Plantar fasciitis, right 06/17/2016  . Osteoarthritis of right foot 06/17/2016  . Primary osteoarthritis, right ankle and foot 09/13/2015  . Carpal tunnel syndrome, right 09/13/2015  . Tarsal tunnel syndrome of right side 09/13/2015  . 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 Asthma, mild intermittent 08/15/2014  . Benign hypertension 08/15/2014  . Herpes genitalis in women 08/15/2014  . Allergic rhinitis 08/15/2014  . Cerebral seizure 08/15/2014  . Vitamin D deficiency 08/15/2014  . Morbid obesity (HCC) 08/03/2014  . Adhesive capsulitis of left shoulder 06/16/2013  .  Obstructive sleep apnea of adult 06/16/2013  . Epilepsy (HCC) 06/16/2013  . Intractable epilepsy (HCC) 06/16/2013  . Anemia, iron deficiency 09/30/2006    Past Surgical History:  Procedure Laterality Date  . BREAST EXCISIONAL BIOPSY Left    age 77 tumor removed  . BREAST SURGERY Left    benign lump at age 52    Prior to Admission medications   Medication Sig Start Date End Date Taking? Authorizing Provider  albuterol (PROVENTIL HFA;VENTOLIN HFA) 108 (90 Base) MCG/ACT inhaler Inhale 2 puffs every 6 (six) hours as needed into the lungs. 01/27/17   Alba Cory, MD  amLODipine (NORVASC) 5 MG tablet Take 1 tablet (5 mg total) by mouth daily. 06/23/17   Poulose, Percell Belt, NP  aspirin EC 81 MG tablet Take 1 tablet (81 mg total) by mouth daily. 12/30/16   Alba Cory, MD  budesonide-formoterol (SYMBICORT) 160-4.5 MCG/ACT inhaler Inhale 2 puffs 2 (two) times daily into the lungs. 01/27/17   Alba Cory, MD  carbamazepine (TEGRETOL) 200 MG tablet TAKE 1 AND 1/2 TABLETS BY  MOUTH IN THE MORNING AND  NOON AND 2 AND 1/2 TABLETS  BY MOUTH AT NIGHT 06/16/17   [provider]  cetirizine (ZYRTEC) 10 MG tablet Take 1 tablet (10 mg total) by mouth daily. 10/08/16   Doren Custard, FNP  Crisaborole (EUCRISA) 2 % OINT Apply 1 g topically daily. 03/17/17   Alba Cory, MD  fluticasone (FLONASE) 50 MCG/ACT nasal spray Place 2 sprays into both nostrils daily.  03/17/17   Alba Cory, MD  levETIRAcetam (KEPPRA) 500 MG tablet Take 3 tablets by mouth 2 (two) times daily. 02/12/08   Bonnee Quin, MD  meloxicam (MOBIC) 15 MG tablet Take 1 tablet (15 mg total) by mouth daily. 06/23/17   Poulose, Percell Belt, NP  rosuvastatin (CRESTOR) 10 MG tablet Take 1 tablet (10 mg total) by mouth daily. 01/01/17   Alba Cory, MD  telmisartan-hydrochlorothiazide (MICARDIS HCT) 80-12.5 MG tablet TAKE 1 TABLET BY MOUTH DAILY 04/17/18   Alba Cory, MD  topiramate (TOPAMAX) 100 MG tablet Take 3  tablets by mouth 3 (three) times daily. 09/30/06   Bonnee Quin, MD  triamcinolone ointment (KENALOG) 0.1 % Apply 1 application topically 2 (two) times daily. 10/08/16   Doren Custard, FNP  valACYclovir (VALTREX) 1000 MG tablet Take 1 tablet (1,000 mg total) by mouth 2 (two) times daily. 12/30/16   Alba Cory, MD  Vitamin D, Ergocalciferol, (DRISDOL) 1.25 MG (50000 UT) CAPS capsule Take 1 capsule by mouth  every 7 days 02/03/18   Cheryle Horsfall, NP    Allergies Ace inhibitors  Family History  Problem Relation Age of Onset  . Cancer Mother        Breast  . Hypertension Mother   . Diabetes Mother   . Hyperlipidemia Mother   . Breast cancer Mother     Social History Social History   Tobacco Use  . Smoking status: Never Smoker  . Smokeless tobacco: Never Used  Substance Use Topics  . Alcohol use: No    Alcohol/week: 0.0 standard drinks  . Drug use: No    Review of Systems Constitutional: No fever/chills Eyes: No visual changes. ENT: No sore throat. Cardiovascular: Positive for chest pain. Respiratory: Denies shortness of breath. Gastrointestinal: No abdominal pain.  No nausea, no vomiting.  No diarrhea.   Genitourinary: Negative for dysuria. Musculoskeletal: Negative for back pain. Skin: Negative for rash. Neurological: Negative for headaches, focal weakness or numbness.  ____________________________________________   PHYSICAL EXAM:  VITAL SIGNS: ED Triage Vitals  Enc Vitals Group     BP 04/25/18 2052 134/75     Pulse Rate 04/25/18 2052 82     Resp 04/25/18 2052 17     Temp --      Temp src --      SpO2 04/25/18 2052 100 %     Weight --      Height --      Head Circumference --      Peak Flow --      Pain Score 04/25/18 1935 6   Constitutional: Alert and oriented.  Eyes: Conjunctivae are normal.  ENT      Head: Normocephalic and atraumatic.      Nose: No congestion/rhinnorhea.      Mouth/Throat: Mucous membranes are moist.      Neck: No  stridor. Hematological/Lymphatic/Immunilogical: No cervical lymphadenopathy. Cardiovascular: Normal rate, regular rhythm.  No murmurs, rubs, or gallops.  Respiratory: Normal respiratory effort without tachypnea nor retractions. Breath sounds are clear and equal bilaterally. No wheezes/rales/rhonchi. Gastrointestinal: Soft and non tender. No rebound. No guarding.  Genitourinary: Deferred Musculoskeletal: Normal range of motion in all extremities. No lower extremity edema. Neurologic:  Normal speech and language. No gross focal neurologic deficits are appreciated.  Skin:  Skin is warm, dry and intact. No rash noted. Psychiatric: Mood and affect are normal. Speech and behavior are normal. Patient exhibits appropriate insight and judgment.  ____________________________________________    LABS (pertinent positives/negatives)  Trop <0.03 CBC wbc 4.7, hgb 11.7, plt 278 BMP wnl except k 3.4, glu 108 ____________________________________________   EKG  I, Phineas SemenGraydon Nikeya Maxim, attending physician, personally viewed and interpreted this EKG  EKG Time: 1939 Rate: 88 Rhythm: normal sinus rhythm Axis: normal Intervals: qtc 430 QRS: narrow ST changes: no st elevation, t wave inversion v1, v2, v3 Impression: abnormal ekg ____________________________________________    RADIOLOGY  CXR No acute abnormality ____________________________________________   PROCEDURES  Procedures  ____________________________________________   INITIAL IMPRESSION / ASSESSMENT AND PLAN / ED COURSE  Pertinent labs & imaging results that were available during my care of the patient were reviewed by me and considered in my medical decision making (see chart for details).   Patient presented to the emergency department today because of concerns for continued chest pain.  Patient with recent diagnosis of gastritis.  Work-up here without any concerning findings.  This point I think gastritis likely.  I did discuss  with patient portance of dietary changes.  Encourage patient to continue her medication.    ____________________________________________   FINAL CLINICAL IMPRESSION(S) / ED DIAGNOSES  Final diagnoses:  Nonspecific chest pain  Gastritis, presence of bleeding unspecified, unspecified chronicity, unspecified gastritis type     Note: This dictation was prepared with Dragon dictation. Any transcriptional errors that result from this process are unintentional     Phineas SemenGoodman, Doroteo Nickolson, MD 04/25/18 2207

## 2018-04-25 NOTE — ED Triage Notes (Signed)
Patient to ED via EMS with complaint of chest pain.  Reports she was here approximately a week ago for similar symptoms and dx'd with reflux. Reports saw Dr. Juliann Pares and given reflux medication and it was helping with the pain.  Reports decreased appetite and still having pain.  Reports pain at epigastric area and when she bends over it causes her head to heart.

## 2018-04-29 ENCOUNTER — Encounter: Payer: Self-pay | Admitting: Family Medicine

## 2018-04-29 ENCOUNTER — Ambulatory Visit: Payer: 59 | Admitting: Family Medicine

## 2018-04-29 DIAGNOSIS — R634 Abnormal weight loss: Secondary | ICD-10-CM

## 2018-04-29 DIAGNOSIS — R1010 Upper abdominal pain, unspecified: Secondary | ICD-10-CM

## 2018-04-29 DIAGNOSIS — G40804 Other epilepsy, intractable, without status epilepticus: Secondary | ICD-10-CM

## 2018-04-29 DIAGNOSIS — M62838 Other muscle spasm: Secondary | ICD-10-CM

## 2018-04-29 DIAGNOSIS — R1013 Epigastric pain: Secondary | ICD-10-CM

## 2018-04-29 DIAGNOSIS — J45901 Unspecified asthma with (acute) exacerbation: Secondary | ICD-10-CM

## 2018-04-29 DIAGNOSIS — F321 Major depressive disorder, single episode, moderate: Secondary | ICD-10-CM | POA: Insufficient documentation

## 2018-04-29 MED ORDER — BUDESONIDE-FORMOTEROL FUMARATE 160-4.5 MCG/ACT IN AERO: 2.0000 | INHALATION_SPRAY | Freq: Two times a day (BID) | RESPIRATORY_TRACT | 0 refills | Status: DC

## 2018-04-29 MED ORDER — METAXALONE 800 MG PO TABS: 800.0000 mg | ORAL_TABLET | Freq: Three times a day (TID) | ORAL | 0 refills | Status: DC

## 2018-04-29 MED ORDER — HYDROCODONE-ACETAMINOPHEN 5-325 MG PO TABS: 1.0000 | ORAL_TABLET | Freq: Four times a day (QID) | ORAL | 0 refills | Status: DC | PRN

## 2018-04-29 NOTE — Progress Notes (Signed)
Name: Paula [name]   MRN: [id number]    DOB: 1960-04-15   Date:04/29/2018       Progress Note  Subjective  Chief Complaint  Chief Complaint  Patient presents with  . Medication Refill  . Hypertension    Denies any symptoms  . Asthma    Gasping for air- SOB needs refill  . Allergic Rhinitis   . Seizures  . Abdominal Pain    Onset-2 weeks, slight fever, fatigue, upper left quandrant- weight loss that shoots up her back, nausea, tender.   . Weight Loss    Has not appetite- lost 20 pounds since last visit-belching    HPI   Patient came in on a wheelchair. She was able to stand up to get her weight. She is a very poor historian. She had a friend that drove her in but does not know the history either.   Abdominal pain: she states symptoms started about two weeks ago, pain under left breast, associated with indigestion but no change in bowel movements, dysuria, hematuria . Pain is constant, but she cannot described it, seems worse with movement of left arm, radiates to her back  (left side) , no rashes. No fever or chills. She did not realize but based on her weight she has lost 20 lbs in the past 2 months and 27 lbs since last year.   Seizures: she sees neurologist and is compliant with medication  SOB: went to [facility] because of left side chest pain and states gets SOB when leans forward, no wheezing or cough. She has a history of asthma, but SOB seems related to inability to take deep breaths secondary to pain. Exam negative for wheezing or calf pain. Normal pulse ox and no tachycardia, unlikely PE but is a possibility  HTN: taking medication, bp at goal, no palpitation.   Morbid obesity: with unintentional weight loss, checking labs today  Patient Active Problem List   Diagnosis Date Noted  . 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 Pre-diabetes 01/01/2017  . 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 Plantar fasciitis, right 06/17/2016  . Osteoarthritis of right foot  06/17/2016  . Primary osteoarthritis, right ankle and foot 09/13/2015  . Carpal tunnel syndrome, right 09/13/2015  . Tarsal tunnel syndrome of right side 09/13/2015  . 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 Asthma, mild intermittent 08/15/2014  . Benign hypertension 08/15/2014  . Herpes genitalis in women 08/15/2014  . Allergic rhinitis 08/15/2014  . Cerebral seizure 08/15/2014  . Vitamin D deficiency 08/15/2014  . Morbid obesity (HCC) 08/03/2014  . Adhesive capsulitis of left shoulder 06/16/2013  . Obstructive sleep apnea of adult 06/16/2013  . Epilepsy (HCC) 06/16/2013  . Intractable epilepsy (HCC) 06/16/2013  . Anemia, iron deficiency 09/30/2006    Past Surgical History:  Procedure Laterality Date  . BREAST EXCISIONAL BIOPSY Left    age 41 tumor removed  . BREAST SURGERY Left    benign lump at age 69    Family History  Problem Relation Age of Onset  . Cancer Mother        Breast  . Hypertension Mother   . Diabetes Mother   . Hyperlipidemia Mother   . Breast cancer Mother     Social History   Socioeconomic History  . Marital status: Single    Spouse name: Not on file  . Number of children: 0  . Years of education: Not on file  . Highest  education level: Not on file  Occupational History  . Occupation: lab specialist    Employer: [employer]  Social Needs  . Financial resource strain: Patient refused  . Food insecurity:    Worry: Patient refused    Inability: Patient refused  . Transportation needs:    Medical: Patient refused    Non-medical: Patient refused  Tobacco Use  . Smoking status: Never Smoker  . Smokeless tobacco: Never Used  Substance and Sexual Activity  . Alcohol use: No    Alcohol/week: 0.0 standard drinks  . Drug use: No  . Sexual activity: Not Currently  Lifestyle  . Physical activity:    Days per week: 0 days    Minutes per session: Not on file  . Stress: Not at all  Relationships  .  Social connections:    Talks on phone: Patient refused    Gets together: Patient refused    Attends religious service: Patient refused    Active member of club or organization: Patient refused    Attends meetings of clubs or organizations: Patient refused    Relationship status: Patient refused  . Intimate partner violence:    Fear of current or ex partner: Patient refused    Emotionally abused: Patient refused    Physically abused: Patient refused    Forced sexual activity: Patient refused  Other Topics Concern  . Not on file  Social History Narrative   Lives with mother and boyfriend   Never had children    Works for [employer]      Current Outpatient Medications:  .  albuterol (PROVENTIL HFA;VENTOLIN HFA) 108 (90 Base) MCG/ACT inhaler, Inhale 2 puffs every 6 (six) hours as needed into the lungs., Disp: 1 Inhaler, Rfl: 0 .  amLODipine (NORVASC) 5 MG tablet, Take 1 tablet (5 mg total) by mouth daily., Disp: 90 tablet, Rfl: 3 .  aspirin EC 81 MG tablet, Take 1 tablet (81 mg total) by mouth daily., Disp: 30 tablet, Rfl: 0 .  budesonide-formoterol (SYMBICORT) 160-4.5 MCG/ACT inhaler, Inhale 2 puffs 2 (two) times daily into the lungs., Disp: 1 Inhaler, Rfl: 2 .  carbamazepine (TEGRETOL) 200 MG tablet, TAKE 1 AND 1/2 TABLETS BY  MOUTH IN THE MORNING AND  NOON AND 2 AND 1/2 TABLETS  BY MOUTH AT NIGHT, Disp: , Rfl:  .  cetirizine (ZYRTEC) 10 MG tablet, Take 1 tablet (10 mg total) by mouth daily. (Patient taking differently: Take 10 mg by mouth as needed. ), Disp: 30 tablet, Rfl: 1 .  fluticasone (FLONASE) 50 MCG/ACT nasal spray, Place 2 sprays into both nostrils daily., Disp: 48 g, Rfl: 1 .  levETIRAcetam (KEPPRA) 500 MG tablet, Take 3 tablets by mouth 2 (two) times daily., Disp: , Rfl:  .  meloxicam (MOBIC) 15 MG tablet, Take 1 tablet (15 mg total) by mouth daily., Disp: 90 tablet, Rfl: 0 .  omeprazole (PRILOSEC) 20 MG capsule, Take by mouth., Disp: , Rfl:  .  PREVIDENT 5000 BOOSTER PLUS 1.1  % PSTE, , Disp: , Rfl:  .  rosuvastatin (CRESTOR) 10 MG tablet, Take 1 tablet (10 mg total) by mouth daily., Disp: 90 tablet, Rfl: 1 .  telmisartan-hydrochlorothiazide (MICARDIS HCT) 80-12.5 MG tablet, TAKE 1 TABLET BY MOUTH DAILY, Disp: 90 tablet, Rfl: 0 .  topiramate (TOPAMAX) 100 MG tablet, Take 3 tablets by mouth 3 (three) times daily., Disp: , Rfl:  .  valACYclovir (VALTREX) 1000 MG tablet, Take 1 tablet (1,000 mg total) by mouth 2 (two) times daily., Disp: 20 tablet,  Rfl: 0 .  Vitamin D, Ergocalciferol, (DRISDOL) 1.25 MG (50000 UT) CAPS capsule, Take 1 capsule by mouth  every 7 days, Disp: 8 capsule, Rfl: 1 .  Crisaborole (EUCRISA) 2 % OINT, Apply 1 g topically daily. (Patient not taking: Reported on 04/29/2018), Disp: 60 g, Rfl: 2 .  triamcinolone ointment (KENALOG) 0.1 %, Apply 1 application topically 2 (two) times daily. (Patient not taking: Reported on 04/29/2018), Disp: 30 g, Rfl: 1  Allergies  Allergen Reactions  . Ace Inhibitors     I personally reviewed active problem list, medication list, allergies, family history, social history with the patient/caregiver today.   ROS  Constitutional: Negative for fever, positive for weight change.  Respiratory: Negative for cough and shortness of breath.   Cardiovascular: positive  for chest pain ( seems to be more LUQ )  or palpitations.  Gastrointestinal: Positive for abdominal pain, no bowel changes.  Musculoskeletal: Negative for gait problem or joint swelling.  Skin: Negative for rash.  Neurological: Negative for dizziness or headache.  No other specific complaints in a complete review of systems (except as listed in HPI above).  Objective  Vitals:   04/29/18 1441  BP: 112/68  Pulse: (!) 105  Resp: 20  Temp: 99.1 F (37.3 C)  TempSrc: Oral  SpO2: 98%  Weight: 272 lb 4.8 oz (123.5 kg)  Height: 5\' 4"  (1.626 m)    Body mass index is 46.74 kg/m.  Physical Exam  Constitutional: Patient appears well-developed and  well-nourished. Obese  No distress.  HEENT: head atraumatic, normocephalic, pupils equal and reactive to light,  neck supple, throat within normal limits Cardiovascular: Normal rate, regular rhythm and normal heart sounds.  No murmur heard. No BLE edema. Pulmonary/Chest: Effort normal and breath sounds normal. No respiratory distress. Abdominal: Soft.  There is  Tenderness upper abdomen, flank and also left upper back, when she raises left arm, pain radiates to neck, seems muscular  Psychiatric: Patient has a normal mood and affect. behavior is normal. Judgment and thought content normal.  Recent Results (from the past 2160 hour(s))  CBC with Differential     Status: Abnormal   Collection Time: 02/02/18 11:37 AM  Result Value Ref Range   WBC 2.7 (L) 3.4 - 10.8 x10E3/uL   RBC 3.66 (L) 3.77 - 5.28 x10E6/uL   Hemoglobin 9.6 (L) 11.1 - 15.9 g/dL   Hematocrit 56.2 (L) 56.3 - 46.6 %   MCV 81 79 - 97 fL   MCH 26.2 (L) 26.6 - 33.0 pg   MCHC 32.2 31.5 - 35.7 g/dL   RDW 89.3 73.4 - 28.7 %   Platelets 210 150 - 450 x10E3/uL   Neutrophils 51 Not Estab. %   Lymphs 36 Not Estab. %   Monocytes 8 Not Estab. %   Eos 4 Not Estab. %   Basos 1 Not Estab. %   Neutrophils Absolute 1.4 1.4 - 7.0 x10E3/uL   Lymphocytes Absolute 1.0 0.7 - 3.1 x10E3/uL   Monocytes Absolute 0.2 0.1 - 0.9 x10E3/uL   EOS (ABSOLUTE) 0.1 0.0 - 0.4 x10E3/uL   Basophils Absolute 0.0 0.0 - 0.2 x10E3/uL   Immature Granulocytes 0 Not Estab. %   Immature Grans (Abs) 0.0 0.0 - 0.1 x10E3/uL  Comprehensive Metabolic Panel (CMET)     Status: Abnormal   Collection Time: 02/02/18 11:37 AM  Result Value Ref Range   Glucose 105 (H) 65 - 99 mg/dL   BUN 14 6 - 24 mg/dL   Creatinine, Ser 6.81 0.57 -  1.00 mg/dL   GFR calc non Af Amer 81 >59 mL/min/1.73   GFR calc Af Amer 93 >59 mL/min/1.73   BUN/Creatinine Ratio 17 9 - 23   Sodium 141 134 - 144 mmol/L   Potassium 3.9 3.5 - 5.2 mmol/L   Chloride 105 96 - 106 mmol/L   CO2 22 20 - 29 mmol/L    Calcium 9.3 8.7 - 10.2 mg/dL   Total Protein 7.1 6.0 - 8.5 g/dL   Albumin 4.1 3.5 - 5.5 g/dL   Globulin, Total 3.0 1.5 - 4.5 g/dL   Albumin/Globulin Ratio 1.4 1.2 - 2.2   Bilirubin Total <0.2 0.0 - 1.2 mg/dL   Alkaline Phosphatase 133 (H) 39 - 117 IU/L   AST 8 0 - 40 IU/L   ALT 7 0 - 32 IU/L  Vitamin D (25 hydroxy)     Status: Abnormal   Collection Time: 02/02/18 11:38 AM  Result Value Ref Range   Vit D, 25-Hydroxy 7.4 (L) 30.0 - 100.0 ng/mL    Comment: Vitamin D deficiency has been defined by the Institute of Medicine and an Endocrine Society practice guideline as a level of serum 25-OH vitamin D less than 20 ng/mL (1,2). The Endocrine Society went on to further define vitamin D insufficiency as a level between 21 and 29 ng/mL (2). 1. IOM (Institute of Medicine). 2010. Dietary reference    intakes for calcium and D. [place]: The    [employer]. 2. Holick MF, Binkley Teachey, Bischoff-Ferrari HA, et al.    Evaluation, treatment, and prevention of vitamin D    deficiency: an Endocrine Society clinical practice    guideline. JCEM. 2011 Jul; 96(7):1911-30.   Basic metabolic panel     Status: Abnormal   Collection Time: 04/17/18  3:13 PM  Result Value Ref Range   Sodium 138 135 - 145 mmol/L   Potassium 3.4 (L) 3.5 - 5.1 mmol/L   Chloride 105 98 - 111 mmol/L   CO2 25 22 - 32 mmol/L   Glucose, Bld 99 70 - 99 mg/dL   BUN 16 6 - 20 mg/dL   Creatinine, Ser 1.61 0.44 - 1.00 mg/dL   Calcium 9.1 8.9 - 09.6 mg/dL   GFR calc non Af Amer >60 >60 mL/min   GFR calc Af Amer >60 >60 mL/min   Anion gap 8 5 - 15    Comment: Performed at [facility], [address] [place], [place] [address]  Troponin I - ONCE - STAT     Status: None   Collection Time: 04/17/18  3:13 PM  Result Value Ref Range   Troponin I <0.03 <0.03 ng/mL    Comment: Performed at [name] [place], [address] [place], [place] [address]  CBC     Status: Abnormal   Collection Time:  04/17/18  4:26 PM  Result Value Ref Range   WBC 3.9 (L) 4.0 - 10.5 K/uL   RBC 3.62 (L) 3.87 - 5.11 MIL/uL   Hemoglobin 9.9 (L) 12.0 - 15.0 g/dL   HCT 14.7 (L) 82.9 - 56.2 %   MCV 87.8 80.0 - 100.0 fL   MCH 27.3 26.0 - 34.0 pg   MCHC 31.1 30.0 - 36.0 g/dL   RDW 13.0 86.5 - 78.4 %   Platelets 220 150 - 400 K/uL   nRBC 0.0 0.0 - 0.2 %    Comment: Performed at [facility], [address], [place], [place] [address]  Basic metabolic panel  Status: Abnormal   Collection Time: 04/25/18  7:38 PM  Result Value Ref Range   Sodium 136 135 - 145 mmol/L   Potassium 3.4 (L) 3.5 - 5.1 mmol/L   Chloride 102 98 - 111 mmol/L   CO2 26 22 - 32 mmol/L   Glucose, Bld 108 (H) 70 - 99 mg/dL   BUN 12 6 - 20 mg/dL   Creatinine, Ser 1.610.80 0.44 - 1.00 mg/dL   Calcium 9.3 8.9 - 09.610.3 mg/dL   GFR calc non Af Amer >60 >60 mL/min   GFR calc Af Amer >60 >60 mL/min   Anion gap 8 5 - 15    Comment: Performed at [facility], [address] [place], [place] [address]  CBC     Status: Abnormal   Collection Time: 04/25/18  7:38 PM  Result Value Ref Range   WBC 4.7 4.0 - 10.5 K/uL   RBC 4.27 3.87 - 5.11 MIL/uL   Hemoglobin 11.7 (L) 12.0 - 15.0 g/dL   HCT 98.137.4 19.136.0 - 47.846.0 %   MCV 87.6 80.0 - 100.0 fL   MCH 27.4 26.0 - 34.0 pg   MCHC 31.3 30.0 - 36.0 g/dL   RDW 29.513.8 62.111.5 - 30.815.5 %   Platelets 278 150 - 400 K/uL   nRBC 0.0 0.0 - 0.2 %    Comment: Performed at [facility], [address] [place], [place] [address]  Troponin I - ONCE - STAT     Status: None   Collection Time: 04/25/18  7:38 PM  Result Value Ref Range   Troponin I <0.03 <0.03 ng/mL    Comment: Performed at [facility], [address] [place], [place] [address]    PHQ2/9: Depression screen [facility] 01/27/2018 06/23/2017 03/17/2017 03/17/2017 12/30/2016  Decreased Interest 0 0 3 0 0  Down, Depressed, Hopeless 0 0 3 0 0  PHQ - 2 Score 0 0 6 0 0  Altered sleeping - - 3 - -  Tired, decreased energy - - 3  - -  Change in appetite - - 3 - -  Feeling bad or failure about yourself  - - 3 - -  Trouble concentrating - - 0 - -  Moving slowly or fidgety/restless - - 0 - -  Suicidal thoughts - - 0 - -  PHQ-9 Score - - 18 - -  Difficult doing work/chores - - Very difficult - -    Fall Risk: Fall Risk  04/29/2018 01/27/2018 06/23/2017 03/17/2017 12/30/2016  Falls in the past year? 0 1 No Yes Yes  Number falls in past yr: - 0 - 2 or more 1  Injury with Fall? - 1 - No No  Comment - - - Right Ankle -  Follow up - - - - Education provided    Functional Status Survey: Is the patient deaf or have difficulty hearing?: No Does the patient have difficulty seeing, even when wearing glasses/contacts?: Yes Does the patient have difficulty concentrating, remembering, or making decisions?: No Does the patient have difficulty walking or climbing stairs?: Yes Does the patient have difficulty dressing or bathing?: No Does the patient have difficulty doing errands alone such as visiting a doctor's office or shopping?: Yes   Assessment & Plan  1. Dyspepsia  - Lipase - H. pylori breath test - CT PANCREAS ABD W/WO; Future  2. Unintended weight loss  - Lipase - H. pylori breath test - CT PANCREAS ABD W/WO; Future  3. Pain of upper abdomen  -  Lipase - H. pylori breath test - CT PANCREAS ABD W/WO; Future - HYDROcodone-acetaminophen (NORCO/VICODIN) 5-325 MG tablet; Take 1 tablet by mouth every 6 (six) hours as needed for moderate pain.  Dispense: 12 tablet; Refill: 0  4. Morbid obesity (HCC)  Lost 20 lbs since Dec, unknown cause   6. 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 Muscle spasm   - metaxalone (SKELAXIN) 800 MG tablet; Take 1 tablet (800 mg total) by mouth 3 (three) times daily.  Dispense: 90 tablet; Refill: 0 - HYDROcodone-acetaminophen (NORCO/VICODIN) 5-325 MG tablet; Take 1 tablet by mouth every 6 (six) hours as needed for moderate pain.   Dispense: 12 tablet; Refill: 0

## 2018-04-30 DIAGNOSIS — R1010 Upper abdominal pain, unspecified: Secondary | ICD-10-CM | POA: Diagnosis not present

## 2018-04-30 DIAGNOSIS — R634 Abnormal weight loss: Secondary | ICD-10-CM | POA: Diagnosis not present

## 2018-04-30 DIAGNOSIS — R1013 Epigastric pain: Secondary | ICD-10-CM | POA: Diagnosis not present

## 2018-05-01 ENCOUNTER — Other Ambulatory Visit: Payer: Self-pay | Admitting: Family Medicine

## 2018-05-01 ENCOUNTER — Telehealth: Payer: Self-pay

## 2018-05-01 LAB — COMP. METABOLIC PANEL (12)
ALK PHOS: 135 IU/L — AB (ref 39–117)
AST: 19 IU/L (ref 0–40)
Albumin/Globulin Ratio: 1.3 (ref 1.2–2.2)
Albumin: 4.4 g/dL (ref 3.8–4.9)
BUN/Creatinine Ratio: 18 (ref 9–23)
BUN: 25 mg/dL — AB (ref 6–24)
Bilirubin Total: 0.4 mg/dL (ref 0.0–1.2)
Calcium: 9.2 mg/dL (ref 8.7–10.2)
Chloride: 94 mmol/L — ABNORMAL LOW (ref 96–106)
Creatinine, Ser: 1.39 mg/dL — ABNORMAL HIGH (ref 0.57–1.00)
GFR calc Af Amer: 49 mL/min/{1.73_m2} — ABNORMAL LOW (ref >59)
GFR calc non Af Amer: 43 mL/min/{1.73_m2} — ABNORMAL LOW (ref >59)
Globulin, Total: 3.5 g/dL (ref 1.5–4.5)
Glucose: 94 mg/dL (ref 65–99)
Potassium: 3.1 mmol/L — ABNORMAL LOW (ref 3.5–5.2)
Sodium: 136 mmol/L (ref 134–144)
Total Protein: 7.9 g/dL (ref 6.0–8.5)

## 2018-05-01 LAB — LIPASE: Lipase: 102 U/L — ABNORMAL HIGH (ref 14–72)

## 2018-05-01 MED ORDER — POTASSIUM CHLORIDE CRYS ER 20 MEQ PO TBCR: 20.0000 meq | EXTENDED_RELEASE_TABLET | Freq: Every day | ORAL | 0 refills | Status: DC

## 2018-05-01 NOTE — Telephone Encounter (Signed)
Copied from CRM [id number]. Topic: General - Inquiry >> May 01, 2018 10:29 AM Paula [name] wrote: Reason for CRM: Patient said Dr Carlynn Purl will be receiving disability paperwork and she just needs that filled out and sent back by 3/5

## 2018-05-02 LAB — H. PYLORI BREATH TEST: H pylori Breath Test: NEGATIVE

## 2018-05-04 DIAGNOSIS — R1012 Left upper quadrant pain: Secondary | ICD-10-CM | POA: Diagnosis not present

## 2018-05-04 DIAGNOSIS — K219 Gastro-esophageal reflux disease without esophagitis: Secondary | ICD-10-CM | POA: Diagnosis not present

## 2018-05-04 DIAGNOSIS — R110 Nausea: Secondary | ICD-10-CM | POA: Diagnosis not present

## 2018-05-05 ENCOUNTER — Telehealth: Payer: Self-pay | Admitting: Family Medicine

## 2018-05-05 DIAGNOSIS — R1013 Epigastric pain: Secondary | ICD-10-CM

## 2018-05-05 DIAGNOSIS — R1010 Upper abdominal pain, unspecified: Secondary | ICD-10-CM

## 2018-05-05 DIAGNOSIS — R634 Abnormal weight loss: Secondary | ICD-10-CM

## 2018-05-05 NOTE — Telephone Encounter (Signed)
Copied from CRM [id number]. Topic: Quick Communication - See Telephone Encounter >> May 05, 2018  1:41 PM Jay Schlichter wrote: CRM for notification. See Telephone encounter for: 05/05/18. [employer] declined the ct pancreas They are asking for a peer to peer.. Please call [phone] option 3 Case number [id number] Policy number [phone]

## 2018-05-05 NOTE — Telephone Encounter (Signed)
Please tell patient not covered by insurance, she needs to see GI

## 2018-05-06 NOTE — Telephone Encounter (Signed)
Patient notified and GI Referral was entered.

## 2018-05-07 ENCOUNTER — Encounter: Payer: Self-pay | Admitting: Family Medicine

## 2018-05-07 ENCOUNTER — Telehealth: Payer: Self-pay | Admitting: Family Medicine

## 2018-05-07 ENCOUNTER — Ambulatory Visit: Payer: 59 | Admitting: Family Medicine

## 2018-05-07 VITALS — BP 130/90 | Temp 98.3°F | Resp 16 | Ht 64.0 in | Wt 281.9 lb

## 2018-05-07 DIAGNOSIS — R0602 Shortness of breath: Secondary | ICD-10-CM

## 2018-05-07 DIAGNOSIS — E876 Hypokalemia: Secondary | ICD-10-CM | POA: Diagnosis not present

## 2018-05-07 DIAGNOSIS — R109 Unspecified abdominal pain: Secondary | ICD-10-CM | POA: Diagnosis not present

## 2018-05-07 DIAGNOSIS — R944 Abnormal results of kidney function studies: Secondary | ICD-10-CM

## 2018-05-07 DIAGNOSIS — R748 Abnormal levels of other serum enzymes: Secondary | ICD-10-CM | POA: Diagnosis not present

## 2018-05-07 NOTE — Telephone Encounter (Signed)
Okay to change from 04/28/2018 till 05/14/2018

## 2018-05-07 NOTE — Telephone Encounter (Signed)
Copied from CRM [id number]. Topic: Quick Communication - See Telephone Encounter >> May 07, 2018  9:35 AM Lorrine Kin, NT wrote: CRM for notification. See Telephone encounter for: 05/07/18. [name] with [employer] calling regarding the patient's short term disability. States that the provider statement that was sent over has the short term disability dates 04/29/2018-05/08/2018. Patient is requesting short term disability from 04/28/2018-05/08/2018. Would like to know if Dr Carlynn Purl would support these dates? Please advise. Will be faxing over a copy of this also. CB#: [phone]

## 2018-05-07 NOTE — Progress Notes (Signed)
Name: Paula [name]   MRN: [id number]    DOB: 09-20-1960   Date:05/07/2018       Progress Note  Subjective  Chief Complaint  Chief Complaint  Patient presents with  . Flank Pain    left  . URI  . Seizures    HPI  Abdominal pain: she was seen on 02/19 as an EC follow up for left flank pain and SOB. She was weak and came in on a wheelchair. She had labs done and lipase was slightly high, mild drop of GFR , H. Pylori negative and hypokalemia. CT was ordered but denied by insurance. She is here today for follow up. She is still not feeling great, but has improved. Still has left flank pain, breathing better. She took potassium supplementation. She was given muscle relaxer and pain medication. She was also referred to cardiologist and has a stress echo to evaluate for SOB that will be done next week on March 4th and 5 th . We will give her a release to go back to work on Friday 05/15/2018    Patient Active Problem List   Diagnosis Date Noted  . Current moderate episode of major depressive disorder (HCC) 04/29/2018  . 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 Pre-diabetes 01/01/2017  . 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 Plantar fasciitis, right 06/17/2016  . Osteoarthritis of right foot 06/17/2016  . Primary osteoarthritis, right ankle and foot 09/13/2015  . Carpal tunnel syndrome, right 09/13/2015  . Tarsal tunnel syndrome of right side 09/13/2015  . 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 Asthma, mild intermittent 08/15/2014  . Benign hypertension 08/15/2014  . Herpes genitalis in women 08/15/2014  . Allergic rhinitis 08/15/2014  . Cerebral seizure 08/15/2014  . Vitamin D deficiency 08/15/2014  . Morbid obesity (HCC) 08/03/2014  . Adhesive capsulitis of left shoulder 06/16/2013  . Obstructive sleep apnea of adult 06/16/2013  . Epilepsy (HCC) 06/16/2013  . Intractable epilepsy (HCC) 06/16/2013  . Anemia, iron deficiency  09/30/2006    Past Surgical History:  Procedure Laterality Date  . BREAST EXCISIONAL BIOPSY Left    age 58 tumor removed  . BREAST SURGERY Left    benign lump at age 72    Family History  Problem Relation Age of Onset  . Cancer Mother        Breast  . Hypertension Mother   . Diabetes Mother   . Hyperlipidemia Mother   . Breast cancer Mother     Social History   Socioeconomic History  . Marital status: Single    Spouse name: Not on file  . Number of children: 0  . Years of education: Not on file  . Highest education level: Not on file  Occupational History  . Occupation: lab specialist    Employer: [employer]  Social Needs  . Financial resource strain: Patient refused  . Food insecurity:    Worry: Patient refused    Inability: Patient refused  . Transportation needs:    Medical: Patient refused    Non-medical: Patient refused  Tobacco Use  . Smoking status: Never Smoker  . Smokeless tobacco: Never Used  Substance and Sexual Activity  . Alcohol use: No    Alcohol/week: 0.0 standard drinks  . Drug use: No  . Sexual activity: Not Currently  Lifestyle  . Physical activity:    Days per week: 0 days    Minutes per session: Not on file  .  Stress: Not at all  Relationships  . Social connections:    Talks on phone: Patient refused    Gets together: Patient refused    Attends religious service: Patient refused    Active member of club or organization: Patient refused    Attends meetings of clubs or organizations: Patient refused    Relationship status: Patient refused  . Intimate partner violence:    Fear of current or ex partner: Patient refused    Emotionally abused: Patient refused    Physically abused: Patient refused    Forced sexual activity: Patient refused  Other Topics Concern  . Not on file  Social History Narrative   Lives with mother and boyfriend   Never had children    Works for [employer]      Current Outpatient Medications:  .  albuterol  (PROVENTIL HFA;VENTOLIN HFA) 108 (90 Base) MCG/ACT inhaler, Inhale 2 puffs every 6 (six) hours as needed into the lungs., Disp: 1 Inhaler, Rfl: 0 .  amLODipine (NORVASC) 5 MG tablet, Take 1 tablet (5 mg total) by mouth daily., Disp: 90 tablet, Rfl: 3 .  aspirin EC 81 MG tablet, Take 1 tablet (81 mg total) by mouth daily., Disp: 30 tablet, Rfl: 0 .  budesonide-formoterol (SYMBICORT) 160-4.5 MCG/ACT inhaler, Inhale 2 puffs into the lungs 2 (two) times daily., Disp: 1 Inhaler, Rfl: 0 .  carbamazepine (TEGRETOL) 200 MG tablet, TAKE 1 AND 1/2 TABLETS BY  MOUTH IN THE MORNING AND  NOON AND 2 AND 1/2 TABLETS  BY MOUTH AT NIGHT, Disp: , Rfl:  .  cetirizine (ZYRTEC) 10 MG tablet, Take 1 tablet (10 mg total) by mouth daily. (Patient taking differently: Take 10 mg by mouth as needed. ), Disp: 30 tablet, Rfl: 1 .  fluticasone (FLONASE) 50 MCG/ACT nasal spray, Place 2 sprays into both nostrils daily., Disp: 48 g, Rfl: 1 .  HYDROcodone-acetaminophen (NORCO/VICODIN) 5-325 MG tablet, Take 1 tablet by mouth every 6 (six) hours as needed for moderate pain., Disp: 12 tablet, Rfl: 0 .  levETIRAcetam (KEPPRA) 500 MG tablet, Take 3 tablets by mouth 2 (two) times daily., Disp: , Rfl:  .  meloxicam (MOBIC) 15 MG tablet, Take 1 tablet (15 mg total) by mouth daily., Disp: 90 tablet, Rfl: 0 .  metaxalone (SKELAXIN) 800 MG tablet, Take 1 tablet (800 mg total) by mouth 3 (three) times daily., Disp: 90 tablet, Rfl: 0 .  omeprazole (PRILOSEC) 20 MG capsule, Take by mouth., Disp: , Rfl:  .  PREVIDENT 5000 BOOSTER PLUS 1.1 % PSTE, , Disp: , Rfl:  .  rosuvastatin (CRESTOR) 10 MG tablet, Take 1 tablet (10 mg total) by mouth daily., Disp: 90 tablet, Rfl: 1 .  telmisartan-hydrochlorothiazide (MICARDIS HCT) 80-12.5 MG tablet, TAKE 1 TABLET BY MOUTH DAILY, Disp: 90 tablet, Rfl: 0 .  topiramate (TOPAMAX) 100 MG tablet, Take 3 tablets by mouth 3 (three) times daily., Disp: , Rfl:  .  valACYclovir (VALTREX) 1000 MG tablet, Take 1 tablet  (1,000 mg total) by mouth 2 (two) times daily., Disp: 20 tablet, Rfl: 0 .  Vitamin D, Ergocalciferol, (DRISDOL) 1.25 MG (50000 UT) CAPS capsule, Take 1 capsule by mouth  every 7 days, Disp: 8 capsule, Rfl: 1 .  potassium chloride SA (K-DUR,KLOR-CON) 20 MEQ tablet, Take 1 tablet (20 mEq total) by mouth daily. (Patient not taking: Reported on 05/07/2018), Disp: 5 tablet, Rfl: 0  Allergies  Allergen Reactions  . Ace Inhibitors     I personally reviewed active problem list, medication list,  allergies, family history, social history with the patient/caregiver today.   ROS  Ten systems reviewed and is negative except as mentioned in HPI   Objective  Vitals:   05/07/18 1144  BP: 130/90  Resp: 16  Temp: 98.3 F (36.8 C)  TempSrc: Oral  Weight: 281 lb 14.4 oz (127.9 kg)  Height: 5\' 4"  (1.626 m)    Body mass index is 48.39 kg/m.  Physical Exam  Constitutional: Patient appears well-developed and well-nourished. Obese No distress.  HEENT: head atraumatic, normocephalic, pupils equal and reactive to light,  neck supple, throat within normal limits Cardiovascular: Normal rate, regular rhythm and normal heart sounds.  No murmur heard. No BLE edema. Pulmonary/Chest: Effort normal and breath sounds normal. No respiratory distress. Abdominal: Soft.  There is no tenderness.pain during palpation of left flank , not true CVA tenderness but is sore Psychiatric: Patient has a normal mood and affect. behavior is normal. Judgment and thought content normal.  Recent Results (from the past 2160 hour(s))  Basic metabolic panel     Status: Abnormal   Collection Time: 04/17/18  3:13 PM  Result Value Ref Range   Sodium 138 135 - 145 mmol/L   Potassium 3.4 (L) 3.5 - 5.1 mmol/L   Chloride 105 98 - 111 mmol/L   CO2 25 22 - 32 mmol/L   Glucose, Bld 99 70 - 99 mg/dL   BUN 16 6 - 20 mg/dL   Creatinine, Ser 8.25 0.44 - 1.00 mg/dL   Calcium 9.1 8.9 - 05.3 mg/dL   GFR calc non Af Amer >60 >60 mL/min    GFR calc Af Amer >60 >60 mL/min   Anion gap 8 5 - 15    Comment: Performed at [facility], [address] [place], [place] [address]  Troponin I - ONCE - STAT     Status: None   Collection Time: 04/17/18  3:13 PM  Result Value Ref Range   Troponin I <0.03 <0.03 ng/mL    Comment: Performed at [facility], [address] [place], [place] [address]  CBC     Status: Abnormal   Collection Time: 04/17/18  4:26 PM  Result Value Ref Range   WBC 3.9 (L) 4.0 - 10.5 K/uL   RBC 3.62 (L) 3.87 - 5.11 MIL/uL   Hemoglobin 9.9 (L) 12.0 - 15.0 g/dL   HCT 90.2 (L) 40.9 - 73.5 %   MCV 87.8 80.0 - 100.0 fL   MCH 27.3 26.0 - 34.0 pg   MCHC 31.1 30.0 - 36.0 g/dL   RDW 32.9 92.4 - 26.8 %   Platelets 220 150 - 400 K/uL   nRBC 0.0 0.0 - 0.2 %    Comment: Performed at [facility], [address] [place], [place] [address]  Basic metabolic panel     Status: Abnormal   Collection Time: 04/25/18  7:38 PM  Result Value Ref Range   Sodium 136 135 - 145 mmol/L   Potassium 3.4 (L) 3.5 - 5.1 mmol/L   Chloride 102 98 - 111 mmol/L   CO2 26 22 - 32 mmol/L   Glucose, Bld 108 (H) 70 - 99 mg/dL   BUN 12 6 - 20 mg/dL   Creatinine, Ser 2.22 0.44 - 1.00 mg/dL   Calcium 9.3 8.9 - 97.9 mg/dL   GFR calc non Af Amer >60 >60 mL/min   GFR calc Af Amer >60 >60 mL/min   Anion gap 8 5 - 15    Comment: Performed at  [facility], [address] [place], [place] [address]  CBC     Status: Abnormal   Collection Time: 04/25/18  7:38 PM  Result Value Ref Range   WBC 4.7 4.0 - 10.5 K/uL   RBC 4.27 3.87 - 5.11 MIL/uL   Hemoglobin 11.7 (L) 12.0 - 15.0 g/dL   HCT 60.4 54.0 - 98.1 %   MCV 87.6 80.0 - 100.0 fL   MCH 27.4 26.0 - 34.0 pg   MCHC 31.3 30.0 - 36.0 g/dL   RDW 19.1 47.8 - 29.5 %   Platelets 278 150 - 400 K/uL   nRBC 0.0 0.0 - 0.2 %    Comment: Performed at [facility], [address] [place], [place] [address]  Troponin I - ONCE - STAT     Status: None    Collection Time: 04/25/18  7:38 PM  Result Value Ref Range   Troponin I <0.03 <0.03 ng/mL    Comment: Performed at [facility], [address] [place], [place] [address]  Comp. Metabolic Panel (12)     Status: Abnormal   Collection Time: 04/30/18  2:32 PM  Result Value Ref Range   Glucose 94 65 - 99 mg/dL   BUN 25 (H) 6 - 24 mg/dL   Creatinine, Ser 4.69 (H) 0.57 - 1.00 mg/dL   GFR calc non Af Amer 43 (L) >59 mL/min/1.73   GFR calc Af Amer 49 (L) >59 mL/min/1.73   BUN/Creatinine Ratio 18 9 - 23   Sodium 136 134 - 144 mmol/L   Potassium 3.1 (L) 3.5 - 5.2 mmol/L   Chloride 94 (L) 96 - 106 mmol/L   Calcium 9.2 8.7 - 10.2 mg/dL   Total Protein 7.9 6.0 - 8.5 g/dL   Albumin 4.4 3.8 - 4.9 g/dL    Comment:               **Please note reference interval change**   Globulin, Total 3.5 1.5 - 4.5 g/dL   Albumin/Globulin Ratio 1.3 1.2 - 2.2   Bilirubin Total 0.4 0.0 - 1.2 mg/dL   Alkaline Phosphatase 135 (H) 39 - 117 IU/L   AST 19 0 - 40 IU/L  Lipase     Status: Abnormal   Collection Time: 04/30/18  2:32 PM  Result Value Ref Range   Lipase 102 (H) 14 - 72 U/L  H. pylori breath test     Status: None   Collection Time: 04/30/18  2:37 PM  Result Value Ref Range   H pylori Breath Test Negative Negative     PHQ2/9: Depression screen [facility] 2/9 01/27/2018 06/23/2017 03/17/2017 03/17/2017 12/30/2016  Decreased Interest 0 0 3 0 0  Down, Depressed, Hopeless 0 0 3 0 0  PHQ - 2 Score 0 0 6 0 0  Altered sleeping - - 3 - -  Tired, decreased energy - - 3 - -  Change in appetite - - 3 - -  Feeling bad or failure about yourself  - - 3 - -  Trouble concentrating - - 0 - -  Moving slowly or fidgety/restless - - 0 - -  Suicidal thoughts - - 0 - -  PHQ-9 Score - - 18 - -  Difficult doing work/chores - - Very difficult - -     Fall Risk: Fall Risk  05/07/2018 04/29/2018 01/27/2018 06/23/2017 03/17/2017  Falls in the past year? 0 0 1 No Yes  Number falls in past yr: 0 - 0 - 2 or more  Injury with  Fall? 0 - 1 - No  Comment - - - - Right Ankle  Follow up - - - - -      Assessment & Plan  1. Elevated lipase  - Lipase  2. Low potassium syndrome  - Comprehensive metabolic panel  3. Decreased GFR  - Comprehensive metabolic panel  4. Abdominal pain, unspecified abdominal location  - CT RENAL STONE STUDY; Future Still having flank pain   5. Left flank pain  CT renal   6. SOB (shortness of breath)  Doing better with inhaler, seen by cardiologist and will have stress echo next week, we will release her for work next Friday after study is done

## 2018-05-08 DIAGNOSIS — R944 Abnormal results of kidney function studies: Secondary | ICD-10-CM | POA: Diagnosis not present

## 2018-05-08 DIAGNOSIS — R748 Abnormal levels of other serum enzymes: Secondary | ICD-10-CM | POA: Diagnosis not present

## 2018-05-08 DIAGNOSIS — E876 Hypokalemia: Secondary | ICD-10-CM | POA: Diagnosis not present

## 2018-05-09 LAB — COMPREHENSIVE METABOLIC PANEL
ALT: 21 IU/L (ref 0–32)
AST: 14 IU/L (ref 0–40)
Albumin/Globulin Ratio: 1.3 (ref 1.2–2.2)
Albumin: 3.9 g/dL (ref 3.8–4.9)
Alkaline Phosphatase: 124 IU/L — ABNORMAL HIGH (ref 39–117)
BUN/Creatinine Ratio: 12 (ref 9–23)
BUN: 10 mg/dL (ref 6–24)
Bilirubin Total: <0.2 mg/dL (ref 0.0–1.2)
CO2: 22 mmol/L (ref 20–29)
Calcium: 9.4 mg/dL (ref 8.7–10.2)
Chloride: 100 mmol/L (ref 96–106)
Creatinine, Ser: 0.86 mg/dL (ref 0.57–1.00)
GFR calc Af Amer: 88 mL/min/{1.73_m2} (ref >59)
GFR calc non Af Amer: 76 mL/min/{1.73_m2} (ref >59)
Globulin, Total: 3.1 g/dL (ref 1.5–4.5)
Glucose: 88 mg/dL (ref 65–99)
Potassium: 4.1 mmol/L (ref 3.5–5.2)
Sodium: 136 mmol/L (ref 134–144)
Total Protein: 7 g/dL (ref 6.0–8.5)

## 2018-05-09 LAB — LIPASE: Lipase: 27 U/L (ref 14–72)

## 2018-05-11 NOTE — Telephone Encounter (Signed)
Called [employer] and informed them of change of dates. Agent was going to send fax and I have not received it.

## 2018-05-13 DIAGNOSIS — R0602 Shortness of breath: Secondary | ICD-10-CM | POA: Diagnosis not present

## 2018-05-13 DIAGNOSIS — I208 Other forms of angina pectoris: Secondary | ICD-10-CM | POA: Diagnosis not present

## 2018-05-18 ENCOUNTER — Other Ambulatory Visit: Payer: Self-pay

## 2018-05-18 ENCOUNTER — Ambulatory Visit
Admission: RE | Admit: 2018-05-18 | Discharge: 2018-05-18 | Disposition: A | Discharge status: Home / Self Care | Payer: 59 | Source: Ambulatory Visit | Attending: Family Medicine | Admitting: Family Medicine

## 2018-05-18 DIAGNOSIS — R109 Unspecified abdominal pain: Secondary | ICD-10-CM | POA: Diagnosis not present

## 2018-05-18 DIAGNOSIS — K8020 Calculus of gallbladder without cholecystitis without obstruction: Secondary | ICD-10-CM | POA: Diagnosis not present

## 2018-05-19 ENCOUNTER — Other Ambulatory Visit: Payer: Self-pay | Admitting: Family Medicine

## 2018-05-19 DIAGNOSIS — R9389 Abnormal findings on diagnostic imaging of other specified body structures: Secondary | ICD-10-CM

## 2018-05-25 ENCOUNTER — Telehealth: Payer: Self-pay

## 2018-05-25 ENCOUNTER — Ambulatory Visit: Payer: 59 | Attending: Family Medicine

## 2018-05-25 NOTE — Telephone Encounter (Signed)
Copied from CRM [id number]. Topic: General - Other >> May 25, 2018  3:40 PM Fanny Bien wrote: Reason for CRM: kim calling with [facility] called and stated that pt did not show up for appointment today.

## 2018-05-26 NOTE — Telephone Encounter (Signed)
Pt's mother stated there was a missed call from the practice with no vm. She's not sure if it was regarding FMLA. She would like a callback if it is needed. Please advise.

## 2018-05-26 NOTE — Telephone Encounter (Signed)
It must have been scanned, I don't have paperwork

## 2018-05-26 NOTE — Telephone Encounter (Signed)
Pt called stating she received letters from [facility] stating they have not received the required information for her leave. 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 Please follow up with pt regarding FMLA. Pt notes she returned back to work 05/17/2018.   Ph#  [phone] after 10:30am

## 2018-05-26 NOTE — Telephone Encounter (Signed)
I put the paperwork in your folder last week.

## 2018-06-24 ENCOUNTER — Encounter: Admission: RE | Payer: Self-pay | Source: Home / Self Care

## 2018-06-24 ENCOUNTER — Ambulatory Visit: Admission: RE | Admit: 2018-06-24 | Payer: 59 | Source: Home / Self Care | Admitting: Internal Medicine

## 2018-06-24 ENCOUNTER — Other Ambulatory Visit: Payer: Self-pay | Admitting: Nurse Practitioner

## 2018-06-24 DIAGNOSIS — I10 Essential (primary) hypertension: Secondary | ICD-10-CM

## 2018-06-24 SURGERY — ESOPHAGOGASTRODUODENOSCOPY (EGD) WITH PROPOFOL
Anesthesia: General

## 2018-07-05 ENCOUNTER — Other Ambulatory Visit: Payer: Self-pay | Admitting: Family Medicine

## 2018-07-05 DIAGNOSIS — I10 Essential (primary) hypertension: Secondary | ICD-10-CM

## 2018-07-06 NOTE — Telephone Encounter (Signed)
appt made for in the morning, state that you can call her sooner than her already scheduled appt

## 2018-07-07 ENCOUNTER — Ambulatory Visit (INDEPENDENT_AMBULATORY_CARE_PROVIDER_SITE_OTHER): Payer: 59 | Admitting: Family Medicine

## 2018-07-07 ENCOUNTER — Encounter: Payer: Self-pay | Admitting: Family Medicine

## 2018-07-07 ENCOUNTER — Other Ambulatory Visit: Payer: Self-pay

## 2018-07-07 DIAGNOSIS — R1013 Epigastric pain: Secondary | ICD-10-CM

## 2018-07-07 DIAGNOSIS — J4520 Mild intermittent asthma, uncomplicated: Secondary | ICD-10-CM | POA: Diagnosis not present

## 2018-07-07 DIAGNOSIS — I10 Essential (primary) hypertension: Secondary | ICD-10-CM | POA: Diagnosis not present

## 2018-07-07 DIAGNOSIS — E7800 Pure hypercholesterolemia, unspecified: Secondary | ICD-10-CM | POA: Diagnosis not present

## 2018-07-07 DIAGNOSIS — R569 Unspecified convulsions: Secondary | ICD-10-CM | POA: Diagnosis not present

## 2018-07-07 MED ORDER — TELMISARTAN-HCTZ 80-12.5 MG PO TABS: 1.0000 | ORAL_TABLET | Freq: Every day | ORAL | 0 refills | Status: DC

## 2018-07-07 NOTE — Progress Notes (Signed)
Name: Paula [name]   MRN: [id number]    DOB: 17-Jul-1960   Date:07/07/2018       Progress Note  Subjective  Chief Complaint  Chief Complaint  Patient presents with  . Medication Management    I connected with  Jacqualin Combes on 07/07/18 at  8:40 AM EDT by telephone and verified that I am speaking with the correct person using two identifiers.  I discussed the limitations, risks, security and privacy concerns of performing an evaluation and management service by telephone and the availability of in person appointments. Staff also discussed with the patient that there may be a patient responsible charge related to this service. Patient Location: home Provider Location: [facility]   HPI  Abdominal pain: she was seen by Dr. Norma Fredrickson on 05/04/2018, she was supposed to go back for EGD but she states she has been feeling better and will wait until post-COVID-19. She also states she is worried about losing her job if she takes time off. She is taking Omeprazole and states the symptoms have improved.   HTN: she needs a refill of medication, she has not been able to check her bp at home, no chest pain or palpitation  Hyperlipidemia: she refuses to take statin therapy. We will recheck level when she returns for a follow up in house  Seizure disorder: she asked for refills but advised to contact neurologist  Asthma mild: she states currently no problems, never opened Symbicort, not using ventolin, no wheezing, she states she coughs after she goes in the freezer at work. Advised layers and monitor Explained symptoms not severe enough for her to have a work note for that   She has been back to work since March 6th, 2020   Patient Active Problem List   Diagnosis Date Noted  . Current moderate episode of major depressive disorder (HCC) 04/29/2018  . 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 Pre-diabetes 01/01/2017  . 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 Plantar  fasciitis, right 06/17/2016  . Osteoarthritis of right foot 06/17/2016  . Primary osteoarthritis, right ankle and foot 09/13/2015  . Carpal tunnel syndrome, right 09/13/2015  . Tarsal tunnel syndrome of right side 09/13/2015  . 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 Asthma, mild intermittent 08/15/2014  . Benign hypertension 08/15/2014  . Herpes genitalis in women 08/15/2014  . Allergic rhinitis 08/15/2014  . Cerebral seizure 08/15/2014  . Vitamin D deficiency 08/15/2014  . Morbid obesity (HCC) 08/03/2014  . Adhesive capsulitis of left shoulder 06/16/2013  . Obstructive sleep apnea of adult 06/16/2013  . Epilepsy (HCC) 06/16/2013  . Intractable epilepsy (HCC) 06/16/2013  . Anemia, iron deficiency 09/30/2006    Past Surgical History:  Procedure Laterality Date  . BREAST EXCISIONAL BIOPSY Left    age 44 tumor removed  . BREAST SURGERY Left    benign lump at age 47    Family History  Problem Relation Age of Onset  . Cancer Mother        Breast  . Hypertension Mother   . Diabetes Mother   . Hyperlipidemia Mother   . Breast cancer Mother     Social History   Socioeconomic History  . Marital status: Single    Spouse name: Not on file  . Number of children: 0  . Years of education: Not on file  . Highest education level: Not on file  Occupational History  . Occupation: lab specialist  Employer: [employer]  Social Needs  . Financial resource strain: Patient refused  . Food insecurity:    Worry: Patient refused    Inability: Patient refused  . Transportation needs:    Medical: Patient refused    Non-medical: Patient refused  Tobacco Use  . Smoking status: Never Smoker  . Smokeless tobacco: Never Used  Substance and Sexual Activity  . Alcohol use: No    Alcohol/week: 0.0 standard drinks  . Drug use: No  . Sexual activity: Not Currently  Lifestyle  . Physical activity:    Days per week: 0 days    Minutes per  session: Not on file  . Stress: Not at all  Relationships  . Social connections:    Talks on phone: Patient refused    Gets together: Patient refused    Attends religious service: Patient refused    Active member of club or organization: Patient refused    Attends meetings of clubs or organizations: Patient refused    Relationship status: Patient refused  . Intimate partner violence:    Fear of current or ex partner: Patient refused    Emotionally abused: Patient refused    Physically abused: Patient refused    Forced sexual activity: Patient refused  Other Topics Concern  . Not on file  Social History Narrative   Lives with mother and boyfriend   Never had children    Works for [employer]      Current Outpatient Medications:  .  albuterol (PROVENTIL HFA;VENTOLIN HFA) 108 (90 Base) MCG/ACT inhaler, Inhale 2 puffs every 6 (six) hours as needed into the lungs., Disp: 1 Inhaler, Rfl: 0 .  amLODipine (NORVASC) 5 MG tablet, TAKE 1 TABLET(5 MG) BY MOUTH DAILY, Disp: 90 tablet, Rfl: 0 .  aspirin EC 81 MG tablet, Take 1 tablet (81 mg total) by mouth daily., Disp: 30 tablet, Rfl: 0 .  carbamazepine (TEGRETOL) 200 MG tablet, TAKE 1 AND 1/2 TABLETS BY  MOUTH IN THE MORNING AND  NOON AND 2 AND 1/2 TABLETS  BY MOUTH AT NIGHT, Disp: , Rfl:  .  cetirizine (ZYRTEC) 10 MG tablet, Take 1 tablet (10 mg total) by mouth daily. (Patient taking differently: Take 10 mg by mouth as needed. ), Disp: 30 tablet, Rfl: 1 .  fluticasone (FLONASE) 50 MCG/ACT nasal spray, Place 2 sprays into both nostrils daily., Disp: 48 g, Rfl: 1 .  levETIRAcetam (KEPPRA) 500 MG tablet, Take 3 tablets by mouth 2 (two) times daily., Disp: , Rfl:  .  omeprazole (PRILOSEC) 20 MG capsule, Take by mouth., Disp: , Rfl:  .  PREVIDENT 5000 BOOSTER PLUS 1.1 % PSTE, , Disp: , Rfl:  .  telmisartan-hydrochlorothiazide (MICARDIS HCT) 80-12.5 MG tablet, TAKE 1 TABLET BY MOUTH DAILY, Disp: 90 tablet, Rfl: 0 .  topiramate (TOPAMAX) 100 MG tablet,  Take 3 tablets by mouth 3 (three) times daily., Disp: , Rfl:  .  valACYclovir (VALTREX) 1000 MG tablet, Take 1 tablet (1,000 mg total) by mouth 2 (two) times daily., Disp: 20 tablet, Rfl: 0  Allergies  Allergen Reactions  . Ace Inhibitors     I personally reviewed active problem list, medication list, allergies, family history, social history with the patient/caregiver today.   ROS  Ten systems reviewed and is negative except as mentioned in HPI   Objective  Virtual encounter, vitals not obtained.  There is no height or weight on file to calculate BMI.  Physical Exam  Voice strong, poor historian Pauses before she collect her thoughts  PHQ2/9: Depression screen [facility] 2/9 07/07/2018 01/27/2018 06/23/2017 03/17/2017 03/17/2017  Decreased Interest 0 0 0 3 0  Down, Depressed, Hopeless 0 0 0 3 0  PHQ - 2 Score 0 0 0 6 0  Altered sleeping 0 - - 3 -  Tired, decreased energy 0 - - 3 -  Change in appetite 0 - - 3 -  Feeling bad or failure about yourself  0 - - 3 -  Trouble concentrating 0 - - 0 -  Moving slowly or fidgety/restless 0 - - 0 -  Suicidal thoughts 0 - - 0 -  PHQ-9 Score 0 - - 18 -  Difficult doing work/chores - - - Very difficult -   PHQ-2/9 Result is negative.    Fall Risk: Fall Risk  07/07/2018 05/07/2018 04/29/2018 01/27/2018 06/23/2017  Falls in the past year? 0 0 0 1 No  Number falls in past yr: 0 0 - 0 -  Injury with Fall? 0 0 - 1 -  Comment - - - - -  Follow up - - - - -     Assessment & Plan  1. Benign hypertension  - telmisartan-hydrochlorothiazide (MICARDIS HCT) 80-12.5 MG tablet; Take 1 tablet by mouth daily.  Dispense: 90 tablet; Refill: 0  2. Mild intermittent asthma without complication  She states she has been doing well, only coughs when very cold  3. Pure hypercholesterolemia  Stopped crestor on her own   4. Seizure (HCC)  Needs to contact neurologist    5. Dyspepsia  resolved  I discussed the assessment and treatment plan with the  patient. The patient was provided an opportunity to ask questions and all were answered. The patient agreed with the plan and demonstrated an understanding of the instructions.   The patient was advised to call back or seek an in-person evaluation if the symptoms worsen or if the condition fails to improve as anticipated.  I provided 25  minutes of non-face-to-face time during this encounter.  Ruel Favors, MD

## 2018-07-14 ENCOUNTER — Telehealth: Payer: Self-pay | Admitting: Family Medicine

## 2018-07-14 ENCOUNTER — Other Ambulatory Visit: Payer: Self-pay | Admitting: Family Medicine

## 2018-07-14 NOTE — Telephone Encounter (Signed)
Pt said that she is totally  Out of her seizure medication and let her know  That [facility] is not the one that writes tis but she said they told her to see is her PCP would write a RX for a  supply till her appt  On June 17th 2020.  She said that she is totally out and needs this medication to keep her from having seizures.

## 2018-07-15 NOTE — Telephone Encounter (Signed)
Left a message for [facility] Neurologist regarding patient seizure medication Keppra and Tegretol.   [address]  [place], [place] [address]  [phone]  Lynett Grimes, MD   Dr. Carlynn Purl would like some clarity on these two medications and a letter stating this would only be for a short term medication refill since Dr. Carlynn Purl has never prescribed these medications before. Also does Dr. Alessandra Bevels need any blood levels and how much exactly the patient would need to get her through until his appointment with the patient. Message was left and given Dr. Carlynn Purl cell phone number so Dr. Alessandra Bevels could call her and discuss this medication refills.

## 2018-08-17 ENCOUNTER — Other Ambulatory Visit: Payer: Self-pay | Admitting: Oncology

## 2018-08-17 ENCOUNTER — Encounter: Payer: Self-pay | Admitting: Oncology

## 2018-08-23 NOTE — Progress Notes (Deleted)
[facility]  [phone]) [phone] [phone]) [phone]  ID: Gardner Candle OB: 04/26/1960  MR#: [id number]  XIP#:[id number]  Patient Care Team: Steele Sizer, MD as PCP - General (Family Medicine) Samara Deist, DPM as Consulting Physician (Podiatry) Janie Morning, MD as Consulting Physician (Neurology) Yolonda Kida, MD as Consulting Physician (Cardiology)  CHIEF COMPLAINT: Iron deficiency anemia.  INTERVAL HISTORY: Patient returns to clinic today for routine 72-month evaluation, discussion of her laboratory work, and consideration of IV iron.  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  She otherwise feels well. She continues to work full-time, but is contemplating retirement in the near future. She has no neurologic complaints. She denies fever or chills.  She reports intentional weight loss.  She denies any chest pain or shortness of breath.  She denies any nausea, vomiting, constipation, or diarrhea. She has no urinary complaints.  Patient offers no further specific complaints today.  REVIEW OF SYSTEMS:   Review of Systems  Constitutional: Positive for malaise/fatigue. Negative for fever and weight loss.  Respiratory: Negative.  Negative for cough and shortness of breath.   Cardiovascular: Negative.  Negative for chest pain and leg swelling.  Gastrointestinal: Negative.  Negative for abdominal pain, blood in stool and melena.  Genitourinary: Negative.  Negative for dysuria.  Musculoskeletal: Negative.  Negative for back pain.  Skin: Negative.  Negative for rash.  Neurological: Positive for weakness. Negative for sensory change and focal weakness.  Psychiatric/Behavioral: The patient has insomnia. The patient is not nervous/anxious.     As per HPI. Otherwise, a complete review of systems is negative.  PAST MEDICAL HISTORY: Anemia, hypertension, seizure disorder, sleep  apnea, hypertension, left breast lumpectomy with Dr. Bary Castilla. Colonoscopy in 2004.  PAST SURGICAL HISTORY: Past Surgical History:  Procedure Laterality Date  . BREAST EXCISIONAL BIOPSY Left    age 20 tumor removed  . BREAST SURGERY Left    benign lump at age 58    FAMILY HISTORY Family History  Problem Relation Age of Onset  . Cancer Mother        Breast  . Hypertension Mother   . Diabetes Mother   . Hyperlipidemia Mother   . Breast cancer Mother        ADVANCED DIRECTIVES:    HEALTH MAINTENANCE: Social History   Tobacco Use  . Smoking status: Never Smoker  . Smokeless tobacco: Never Used  Substance Use Topics  . Alcohol use: No    Alcohol/week: 0.0 standard drinks  . Drug use: No     Allergies  Allergen Reactions  . Ace Inhibitors     Current Outpatient Medications  Medication Sig Dispense Refill  . albuterol (PROVENTIL HFA;VENTOLIN HFA) 108 (90 Base) MCG/ACT inhaler Inhale 2 puffs every 6 (six) hours as needed into the lungs. 1 Inhaler 0  . amLODipine (NORVASC) 5 MG tablet TAKE 1 TABLET(5 MG) BY MOUTH DAILY 90 tablet 0  . aspirin EC 81 MG tablet Take 1 tablet (81 mg total) by mouth daily. 30 tablet 0  . carbamazepine (TEGRETOL) 200 MG tablet TAKE 1 AND 1/2 TABLETS BY  MOUTH IN THE MORNING AND  NOON AND 2 AND 1/2 TABLETS  BY MOUTH AT NIGHT    . cetirizine (ZYRTEC) 10 MG tablet Take 1 tablet (10 mg total) by mouth daily. (Patient taking differently: Take 10 mg by mouth as needed. ) 30 tablet 1  . fluticasone (FLONASE)  50 MCG/ACT nasal spray Place 2 sprays into both nostrils daily. 48 g 1  . levETIRAcetam (KEPPRA) 500 MG tablet Take 3 tablets by mouth 2 (two) times daily.    Marland Kitchen. omeprazole (PRILOSEC) 20 MG capsule Take by mouth.    Marland Kitchen. PREVIDENT 5000 BOOSTER PLUS 1.1 % PSTE     . telmisartan-hydrochlorothiazide (MICARDIS HCT) 80-12.5 MG tablet Take 1 tablet by mouth daily. 90 tablet 0  . topiramate (TOPAMAX) 100 MG tablet Take 3 tablets by mouth 3 (three) times  daily.    . valACYclovir (VALTREX) 1000 MG tablet Take 1 tablet (1,000 mg total) by mouth 2 (two) times daily. 20 tablet 0   No current facility-administered medications for this visit.     OBJECTIVE: There were no vitals filed for this visit.   There is no height or weight on file to calculate BMI.    ECOG FS:0 - Asymptomatic  General: Obese, no acute distress. Eyes: Pink conjunctiva, anicteric sclera. HEENT: Normocephalic, moist mucous membranes. Lungs: Clear to auscultation bilaterally. Heart: Regular rate and rhythm. No rubs, murmurs, or gallops. Abdomen: Soft, nontender, nondistended. No organomegaly noted, normoactive bowel sounds. Musculoskeletal: No edema, cyanosis, or clubbing. Neuro: Alert, answering all questions appropriately. Cranial nerves grossly intact. Skin: No rashes or petechiae noted. Psych: Normal affect.  LAB RESULTS:  Lab Results  Component Value Date   NA 136 05/08/2018   K 4.1 05/08/2018   CL 100 05/08/2018   CO2 22 05/08/2018   GLUCOSE 88 05/08/2018   BUN 10 05/08/2018   CREATININE 0.86 05/08/2018   CALCIUM 9.4 05/08/2018   PROT 7.0 05/08/2018   ALBUMIN 3.9 05/08/2018   AST 14 05/08/2018   ALT 21 05/08/2018   ALKPHOS 124 (H) 05/08/2018   BILITOT <0.2 05/08/2018   GFRNONAA 76 05/08/2018   GFRAA 88 05/08/2018    Lab Results  Component Value Date   WBC 4.7 04/25/2018   NEUTROABS 1.4 02/02/2018   HGB 11.7 (L) 04/25/2018   HCT 37.4 04/25/2018   MCV 87.6 04/25/2018   PLT 278 04/25/2018     STUDIES: No results found.  ASSESSMENT: Iron deficiency anemia.  PLAN:    1. Iron deficiency anemia: Patient's most recent hemoglobin has trended down and is now 9.6.  Her iron stores are slightly decreased as well.  Patient is also symptomatic. Previously, the entire work-up was either negative or within normal limits.  Proceed with 510 mg IV Feraheme today.  Return to clinic in 6 months with repeat laboratory work and further evaluation.  Patient  receives all of her laboratory work at [facility].   2.  Leukopenia: Chronic and unchanged. 3.  Insomnia: Chronic and unchanged.  Previously recommended patient discuss with her primary care physician to improve sleep habits.  I spent a total of 30 minutes face-to-face with the patient of which greater than 50% of the visit was spent in counseling and coordination of care as detailed above.   Patient expressed understanding and was in agreement with this plan. She also understands that She can call clinic at any time with any questions, concerns, or complaints.    Jeralyn Ruthsimothy J Tushar Enns, MD 08/23/18 9:01 AM

## 2018-08-24 ENCOUNTER — Encounter: Payer: Self-pay | Admitting: Oncology

## 2018-08-24 ENCOUNTER — Inpatient Hospital Stay: Payer: 59

## 2018-08-24 ENCOUNTER — Inpatient Hospital Stay: Payer: 59 | Admitting: Oncology

## 2018-08-31 ENCOUNTER — Ambulatory Visit: Payer: 59

## 2018-08-31 ENCOUNTER — Ambulatory Visit: Payer: 59 | Admitting: Oncology

## 2018-09-25 ENCOUNTER — Telehealth: Payer: Self-pay | Admitting: Family Medicine

## 2018-09-25 DIAGNOSIS — I10 Essential (primary) hypertension: Secondary | ICD-10-CM

## 2018-09-28 NOTE — Telephone Encounter (Signed)
Pt is scheduled °

## 2018-10-08 ENCOUNTER — Other Ambulatory Visit: Payer: Self-pay | Admitting: Family Medicine

## 2018-10-08 DIAGNOSIS — B002 Herpesviral gingivostomatitis and pharyngotonsillitis: Secondary | ICD-10-CM

## 2018-10-08 NOTE — Telephone Encounter (Signed)
Patient would like a refill on her Herpes medication.  She could not remember the name of the medication and said the doctor would know the right one.  She would like to have the prescription sent to her preferred pharmacy [facility] on [address].

## 2018-10-09 MED ORDER — VALACYCLOVIR HCL 1 G PO TABS: 1000.0000 mg | ORAL_TABLET | Freq: Two times a day (BID) | ORAL | 0 refills | Status: DC

## 2018-12-03 ENCOUNTER — Encounter: Payer: Self-pay | Admitting: Family Medicine

## 2018-12-03 ENCOUNTER — Ambulatory Visit: Payer: 59 | Admitting: Family Medicine

## 2018-12-03 ENCOUNTER — Other Ambulatory Visit: Payer: Self-pay

## 2018-12-03 VITALS — BP 130/80 | HR 87 | Temp 97.3°F | Resp 14 | Ht 64.0 in | Wt 290.3 lb

## 2018-12-03 DIAGNOSIS — Z79899 Other long term (current) drug therapy: Secondary | ICD-10-CM

## 2018-12-03 DIAGNOSIS — G4733 Obstructive sleep apnea (adult) (pediatric): Secondary | ICD-10-CM

## 2018-12-03 DIAGNOSIS — R7303 Prediabetes: Secondary | ICD-10-CM

## 2018-12-03 DIAGNOSIS — D509 Iron deficiency anemia, unspecified: Secondary | ICD-10-CM

## 2018-12-03 DIAGNOSIS — E7800 Pure hypercholesterolemia, unspecified: Secondary | ICD-10-CM

## 2018-12-03 DIAGNOSIS — J3089 Other allergic rhinitis: Secondary | ICD-10-CM

## 2018-12-03 DIAGNOSIS — J4520 Mild intermittent asthma, uncomplicated: Secondary | ICD-10-CM

## 2018-12-03 DIAGNOSIS — K219 Gastro-esophageal reflux disease without esophagitis: Secondary | ICD-10-CM

## 2018-12-03 DIAGNOSIS — R569 Unspecified convulsions: Secondary | ICD-10-CM

## 2018-12-03 DIAGNOSIS — I10 Essential (primary) hypertension: Secondary | ICD-10-CM

## 2018-12-03 DIAGNOSIS — Z23 Encounter for immunization: Secondary | ICD-10-CM

## 2018-12-03 DIAGNOSIS — E559 Vitamin D deficiency, unspecified: Secondary | ICD-10-CM

## 2018-12-03 MED ORDER — TELMISARTAN-HCTZ 80-12.5 MG PO TABS: 1.0000 | ORAL_TABLET | Freq: Every day | ORAL | 1 refills | Status: DC

## 2018-12-03 MED ORDER — FLUTICASONE PROPIONATE 50 MCG/ACT NA SUSP: 2.0000 | Freq: Every day | NASAL | 1 refills | Status: DC

## 2018-12-03 MED ORDER — OMEPRAZOLE 20 MG PO CPDR: 20.0000 mg | DELAYED_RELEASE_CAPSULE | Freq: Every day | ORAL | 1 refills | Status: DC

## 2018-12-03 MED ORDER — AMLODIPINE BESYLATE 5 MG PO TABS: 5.0000 mg | ORAL_TABLET | Freq: Every day | ORAL | 1 refills | Status: DC

## 2018-12-03 NOTE — Progress Notes (Signed)
Name: Paula [name]   MRN: [id number]    DOB: [birth year]   Date:12/03/2018       Progress Note  Subjective  Chief Complaint  Chief Complaint  Patient presents with  . Medication Refill  . Hypertension  . Hyperlipidemia  . Asthma  . Seizure disorder    HPI  Abdominal pain: she was seen by Dr. Alice Reichert on 05/04/2018, she was supposed to go back for EGD but she states she has been feeling better and will wait until post-COVID-19.  She is taking Omeprazole and states the symptoms have improved so she does not want to have it done anymore. No eating much since mother is in a nursing home and boyfriend does not know how to cook   HTN: she needs a refill of medication, she usually takes bp medications around 9 or 10 am, no chest pain or palpitation  Hyperlipidemia: she refuses to take statin therapy. We will recheck level and re-calculate her risk of heart attacks and strokes   Seizure disorder: [facility] neurologist, has follow up in October, we will check labs for neurologist. No episodes of seizure in years and is able to drive   Asthma mild: she states currently no problems, never opened Symbicort, not using ventolin, no wheezing, she states she coughs after she goes in the freezer at work and sometimes at night less than once a week/usually when fan is blowing on her   OSA: mild, per patient she does not need CPAP since she lost weight.   Morbid Obesity: her weight has gone up, she states she will go back to the gym again, she is still paying for her membership, discussed high protein low carb diet   Patient Active Problem List   Diagnosis Date Noted  . Current moderate episode of major depressive disorder ([place]) 04/29/2018  . 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 Pre-diabetes 01/01/2017  . 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 Plantar fasciitis, right 06/17/2016  . Osteoarthritis of right foot 06/17/2016  . Primary osteoarthritis, right ankle and foot 09/13/2015  .  Carpal tunnel syndrome, right 09/13/2015  . Tarsal tunnel syndrome of right side 09/13/2015  . 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 Asthma, mild intermittent 08/15/2014  . Benign hypertension 08/15/2014  . Herpes genitalis in women 08/15/2014  . Allergic rhinitis 08/15/2014  . Cerebral seizure 08/15/2014  . Vitamin D deficiency 08/15/2014  . Morbid obesity ([place]) 08/03/2014  . Adhesive capsulitis of left shoulder 06/16/2013  . Obstructive sleep apnea of adult 06/16/2013  . Epilepsy ([place]) 06/16/2013  . Anemia, iron deficiency 09/30/2006    Past Surgical History:  Procedure Laterality Date  . BREAST EXCISIONAL BIOPSY Left    age 30 tumor removed  . BREAST SURGERY Left    benign lump at age 30    Family History  Problem Relation Age of Onset  . Cancer Mother        Breast  . Hypertension Mother   . Diabetes Mother   . Hyperlipidemia Mother   . Breast cancer Mother     Social History   Socioeconomic History  . Marital status: Soil scientist    Spouse name: Erbin   . Number of children: 0  . Years of education: Not on file  . Highest education level: Associate degree: occupational, Hotel manager, or vocational program  Occupational History  . Occupation: lab specialist    Employer: [employer]  . Financial  resource strain: Not hard at all  . Food insecurity    Worry: Never true    Inability: Never true  . Transportation needs    Medical: No    Non-medical: Patient refused  Tobacco Use  . Smoking status: Never Smoker  . Smokeless tobacco: Never Used  Substance and Sexual Activity  . Alcohol use: No    Alcohol/week: 0.0 standard drinks  . Drug use: No  . Sexual activity: Not Currently  Lifestyle  . Physical activity    Days per week: 0 days    Minutes per session: 0 min  . Stress: Not at all  Relationships  . Social connections    Talks on phone: More than three times a week    Gets together: Once a  week    Attends religious service: More than 4 times per year    Active member of club or organization: Yes    Attends meetings of clubs or organizations: More than 4 times per year    Relationship status: Living with partner  . Intimate partner violence    Fear of current or ex partner: No    Emotionally abused: No    Physically abused: No    Forced sexual activity: No  Other Topics Concern  . Not on file  Social History Narrative   Lives with mother and boyfriend, currently mother is in a nursing home   Never had children    Works for [employer]      Current Outpatient Medications:  .  albuterol (PROVENTIL HFA;VENTOLIN HFA) 108 (90 Base) MCG/ACT inhaler, Inhale 2 puffs every 6 (six) hours as needed into the lungs., Disp: 1 Inhaler, Rfl: 0 .  amLODipine (NORVASC) 5 MG tablet, TAKE 1 TABLET(5 MG) BY MOUTH DAILY, Disp: 90 tablet, Rfl: 0 .  aspirin EC 81 MG tablet, Take 1 tablet (81 mg total) by mouth daily., Disp: 30 tablet, Rfl: 0 .  carbamazepine (TEGRETOL) 200 MG tablet, TAKE 1 AND 1/2 TABLETS BY  MOUTH IN THE MORNING AND  NOON AND 2 AND 1/2 TABLETS  BY MOUTH AT NIGHT, Disp: , Rfl:  .  cetirizine (ZYRTEC) 10 MG tablet, Take 1 tablet (10 mg total) by mouth daily. (Patient taking differently: Take 10 mg by mouth as needed. ), Disp: 30 tablet, Rfl: 1 .  fluticasone (FLONASE) 50 MCG/ACT nasal spray, Place 2 sprays into both nostrils daily., Disp: 48 g, Rfl: 1 .  levETIRAcetam (KEPPRA) 500 MG tablet, Take 3 tablets by mouth 2 (two) times daily., Disp: , Rfl:  .  omeprazole (PRILOSEC) 20 MG capsule, Take by mouth., Disp: , Rfl:  .  PREVIDENT 5000 BOOSTER PLUS 1.1 % PSTE, , Disp: , Rfl:  .  telmisartan-hydrochlorothiazide (MICARDIS HCT) 80-12.5 MG tablet, TAKE 1 TABLET BY MOUTH DAILY, Disp: 90 tablet, Rfl: 0 .  topiramate (TOPAMAX) 100 MG tablet, Take 3 tablets by mouth 3 (three) times daily., Disp: , Rfl:  .  valACYclovir (VALTREX) 1000 MG tablet, Take 1 tablet (1,000 mg total) by mouth 2  (two) times daily., Disp: 20 tablet, Rfl: 0  Allergies  Allergen Reactions  . Ace Inhibitors     I personally reviewed active problem list, medication list, allergies, family history, social history, health maintenance with the patient/caregiver today.   ROS  Constitutional: Negative for fever, positive for weight change.  Respiratory: Negative for cough and shortness of breath.   Cardiovascular: Negative for chest pain or palpitations.  Gastrointestinal: Negative for abdominal pain, no bowel changes.  Musculoskeletal: Positive  for gait problem but no  joint swelling.  Skin: Negative for rash.  Neurological: Negative for dizziness or headache.  No other specific complaints in a complete review of systems (except as listed in HPI above).  Objective  Vitals:   12/03/18 0911  BP: 130/80  Pulse: 87  Resp: 14  Temp: (!) 97.3 F (36.3 C)  SpO2: 98%  Weight: 290 lb 4.8 oz (131.7 kg)  Height: 5\' 4"  (1.626 m)    Body mass index is 49.83 kg/m.  Physical Exam  Constitutional: Patient appears well-developed and well-nourished. Obese  No distress.  HEENT: head atraumatic, normocephalic, pupils equal and reactive to light,, Cardiovascular: Normal rate, regular rhythm and normal heart sounds.  No murmur heard. No BLE edema. Pulmonary/Chest: Effort normal and breath sounds normal. No respiratory distress. Abdominal: Soft.  There is no tenderness. Psychiatric: Patient has a normal mood and affect. behavior is normal. Judgment and thought content normal.   PHQ2/9: [facility] screen [facility] 12/03/2018 07/07/2018 01/27/2018 06/23/2017 03/17/2017  Decreased Interest 0 0 0 0 3  Down, Depressed, Hopeless 0 0 0 0 3  PHQ - 2 Score 0 0 0 0 6  Altered sleeping 0 0 - - 3  Tired, decreased energy 0 0 - - 3  Change in appetite 0 0 - - 3  Feeling bad or failure about yourself  0 0 - - 3  Trouble concentrating 0 0 - - 0  Moving slowly or fidgety/restless 0 0 - - 0  Suicidal thoughts 0 0 - - 0   PHQ-9 Score 0 0 - - 18  Difficult doing work/chores Not difficult at all - - - Very difficult    phq 9 is negative   Fall Risk: Fall Risk  12/03/2018 07/07/2018 05/07/2018 04/29/2018 01/27/2018  Falls in the past year? 0 0 0 0 1  Number falls in past yr: 0 0 0 - 0  Injury with Fall? 0 0 0 - 1  Comment - - - - -  Follow up - - - - -     Functional Status Survey: Is the patient deaf or have difficulty hearing?: No Does the patient have difficulty seeing, even when wearing glasses/contacts?: No Does the patient have difficulty concentrating, remembering, or making decisions?: No Does the patient have difficulty walking or climbing stairs?: No Does the patient have difficulty dressing or bathing?: No Does the patient have difficulty doing errands alone such as visiting a doctor's office or shopping?: No    Assessment & Plan  1. Need for immunization against influenza  - Flu Vaccine QUAD 36+ mos IM  2. Seizure (HCC)  - Carbamazepine Level (Tegretol), total - Levetiracetam level  3. Mild intermittent asthma without complication   4. Benign hypertension  - Comprehensive metabolic panel  5. Pure hypercholesterolemia  - Lipid panel  6. Morbid obesity (HCC)  Discussed with the patient the risk posed by an increased BMI. Discussed importance of portion control, calorie counting and at least 150 minutes of physical activity weekly. Avoid sweet beverages and drink more water. Eat at least 6 servings of fruit and vegetables daily   7. Obstructive sleep apnea of adult  Not on CPAP   8. Pre-diabetes  - Hemoglobin A1c  9. Vitamin D deficiency  - VITAMIN D 25 Hydroxy (Vit-D Deficiency, Fractures)  10. Iron deficiency anemia, unspecified iron deficiency anemia type  - CBC with Differential/Platelet - Iron, TIBC and Ferritin Panel  11. Long-term use of high-risk medication  -  Vitamin B12

## 2018-12-16 LAB — IRON,TIBC AND FERRITIN PANEL
Ferritin: 130 ng/mL (ref 15–150)
Iron Saturation: 34 % (ref 15–55)
Iron: 66 ug/dL (ref 27–159)
Total Iron Binding Capacity: 197 ug/dL — ABNORMAL LOW (ref 250–450)
UIBC: 131 ug/dL (ref 131–425)

## 2018-12-16 LAB — COMPREHENSIVE METABOLIC PANEL
ALT: 9 IU/L (ref 0–32)
AST: 12 IU/L (ref 0–40)
Albumin/Globulin Ratio: 1.4 (ref 1.2–2.2)
Albumin: 4.1 g/dL (ref 3.8–4.9)
Alkaline Phosphatase: 135 IU/L — ABNORMAL HIGH (ref 39–117)
BUN/Creatinine Ratio: 15 (ref 9–23)
BUN: 12 mg/dL (ref 6–24)
Bilirubin Total: <0.2 mg/dL (ref 0.0–1.2)
CO2: 22 mmol/L (ref 20–29)
Calcium: 9.1 mg/dL (ref 8.7–10.2)
Chloride: 106 mmol/L (ref 96–106)
Creatinine, Ser: 0.81 mg/dL (ref 0.57–1.00)
GFR calc Af Amer: 94 mL/min/{1.73_m2} (ref >59)
GFR calc non Af Amer: 81 mL/min/{1.73_m2} (ref >59)
Globulin, Total: 2.9 g/dL (ref 1.5–4.5)
Glucose: 89 mg/dL (ref 65–99)
Potassium: 4.3 mmol/L (ref 3.5–5.2)
Sodium: 138 mmol/L (ref 134–144)
Total Protein: 7 g/dL (ref 6.0–8.5)

## 2018-12-16 LAB — VITAMIN D 25 HYDROXY (VIT D DEFICIENCY, FRACTURES): Vit D, 25-Hydroxy: 10.3 ng/mL — ABNORMAL LOW (ref 30.0–100.0)

## 2018-12-16 LAB — CBC WITH DIFFERENTIAL/PLATELET
Basophils Absolute: 0 10*3/uL (ref 0.0–0.2)
Basos: 1 %
EOS (ABSOLUTE): 0.3 10*3/uL (ref 0.0–0.4)
Eos: 9 %
Hematocrit: 32.3 % — ABNORMAL LOW (ref 34.0–46.6)
Hemoglobin: 10.3 g/dL — ABNORMAL LOW (ref 11.1–15.9)
Immature Grans (Abs): 0 10*3/uL (ref 0.0–0.1)
Immature Granulocytes: 0 %
Lymphocytes Absolute: 1 10*3/uL (ref 0.7–3.1)
Lymphs: 35 %
MCH: 27 pg (ref 26.6–33.0)
MCHC: 31.9 g/dL (ref 31.5–35.7)
MCV: 85 fL (ref 79–97)
Monocytes Absolute: 0.2 10*3/uL (ref 0.1–0.9)
Monocytes: 8 %
Neutrophils Absolute: 1.3 10*3/uL — ABNORMAL LOW (ref 1.4–7.0)
Neutrophils: 47 %
Platelets: 158 10*3/uL (ref 150–450)
RBC: 3.81 x10E6/uL (ref 3.77–5.28)
RDW: 13.8 % (ref 11.7–15.4)
WBC: 2.9 10*3/uL — ABNORMAL LOW (ref 3.4–10.8)

## 2018-12-16 LAB — LIPID PANEL
Chol/HDL Ratio: 2.4 ratio (ref 0.0–4.4)
Cholesterol, Total: 228 mg/dL — ABNORMAL HIGH (ref 100–199)
HDL: 95 mg/dL (ref >39)
LDL Chol Calc (NIH): 122 mg/dL — ABNORMAL HIGH (ref 0–99)
Triglycerides: 62 mg/dL (ref 0–149)
VLDL Cholesterol Cal: 11 mg/dL (ref 5–40)

## 2018-12-16 LAB — VITAMIN B12: Vitamin B-12: 301 pg/mL (ref 232–1245)

## 2018-12-16 LAB — LEVETIRACETAM LEVEL: Levetiracetam Lvl: 57.3 ug/mL — ABNORMAL HIGH (ref 10.0–40.0)

## 2018-12-16 LAB — HEMOGLOBIN A1C
Est. average glucose Bld gHb Est-mCnc: 117 mg/dL
Hgb A1c MFr Bld: 5.7 % — ABNORMAL HIGH (ref 4.8–5.6)

## 2018-12-16 LAB — CARBAMAZEPINE LEVEL, TOTAL: Carbamazepine (Tegretol), S: 10.8 ug/mL (ref 4.0–12.0)

## 2019-02-14 NOTE — Progress Notes (Deleted)
[facility]  Telephone:(336) [phone] [phone]) [phone]  ID: Paula [name] OB: May 18, 1960  MR#: [id number]  VEL#:[id number]  Patient Care Team: Alba Cory, MD as PCP - General (Family Medicine) Gwyneth Revels, DPM as Consulting Physician (Podiatry) Bonnee Quin, MD as Consulting Physician (Neurology) Alwyn Pea, MD as Consulting Physician (Cardiology)  CHIEF COMPLAINT: Iron deficiency anemia.  INTERVAL HISTORY: Patient returns to clinic today for routine 28-month evaluation, discussion of her laboratory work, and consideration of IV iron.  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  She otherwise feels well. She continues to work full-time, but is contemplating retirement in the near future. She has no neurologic complaints. She denies fever or chills.  She reports intentional weight loss.  She denies any chest pain or shortness of breath.  She denies any nausea, vomiting, constipation, or diarrhea. She has no urinary complaints.  Patient offers no further specific complaints today.  REVIEW OF SYSTEMS:   Review of Systems  Constitutional: Positive for malaise/fatigue. Negative for fever and weight loss.  Respiratory: Negative.  Negative for cough and shortness of breath.   Cardiovascular: Negative.  Negative for chest pain and leg swelling.  Gastrointestinal: Negative.  Negative for abdominal pain, blood in stool and melena.  Genitourinary: Negative.  Negative for dysuria.  Musculoskeletal: Negative.  Negative for back pain.  Skin: Negative.  Negative for rash.  Neurological: Positive for weakness. Negative for sensory change and focal weakness.  Psychiatric/Behavioral: The patient has insomnia. The patient is not nervous/anxious.     As per HPI. Otherwise, a complete review of systems is negative.  PAST MEDICAL HISTORY: Anemia, hypertension, seizure disorder, sleep  apnea, hypertension, left breast lumpectomy with Dr. Lemar Livings. Colonoscopy in 2004.  PAST SURGICAL HISTORY: Past Surgical History:  Procedure Laterality Date  . BREAST EXCISIONAL BIOPSY Left    age 55 tumor removed  . BREAST SURGERY Left    benign lump at age 37    FAMILY HISTORY Family History  Problem Relation Age of Onset  . Cancer Mother        Breast  . Hypertension Mother   . Diabetes Mother   . Hyperlipidemia Mother   . Breast cancer Mother        ADVANCED DIRECTIVES:    HEALTH MAINTENANCE: Social History   Tobacco Use  . Smoking status: Never Smoker  . Smokeless tobacco: Never Used  Substance Use Topics  . Alcohol use: No    Alcohol/week: 0.0 standard drinks  . Drug use: No     Allergies  Allergen Reactions  . Ace Inhibitors     Current Outpatient Medications  Medication Sig Dispense Refill  . albuterol (PROVENTIL HFA;VENTOLIN HFA) 108 (90 Base) MCG/ACT inhaler Inhale 2 puffs every 6 (six) hours as needed into the lungs. 1 Inhaler 0  . amLODipine (NORVASC) 5 MG tablet Take 1 tablet (5 mg total) by mouth daily. 90 tablet 1  . aspirin EC 81 MG tablet Take 1 tablet (81 mg total) by mouth daily. 30 tablet 0  . carbamazepine (TEGRETOL) 200 MG tablet TAKE 1 AND 1/2 TABLETS BY  MOUTH IN THE MORNING AND  NOON AND 2 AND 1/2 TABLETS  BY MOUTH AT NIGHT    . cetirizine (ZYRTEC) 10 MG tablet Take 1 tablet (10 mg total) by mouth daily. (Patient taking differently: Take 10 mg by mouth as needed. ) 30 tablet 1  .  fluticasone (FLONASE) 50 MCG/ACT nasal spray Place 2 sprays into both nostrils daily. 48 g 1  . levETIRAcetam (KEPPRA) 500 MG tablet Take 3 tablets by mouth 2 (two) times daily.    Marland Kitchen omeprazole (PRILOSEC) 20 MG capsule Take 1 capsule (20 mg total) by mouth daily. 90 capsule 1  . PREVIDENT 5000 BOOSTER PLUS 1.1 % PSTE     . telmisartan-hydrochlorothiazide (MICARDIS HCT) 80-12.5 MG tablet Take 1 tablet by mouth daily. 90 tablet 1  . topiramate (TOPAMAX) 100 MG  tablet Take 3 tablets by mouth 3 (three) times daily.    . valACYclovir (VALTREX) 1000 MG tablet Take 1 tablet (1,000 mg total) by mouth 2 (two) times daily. 20 tablet 0   No current facility-administered medications for this visit.     OBJECTIVE: There were no vitals filed for this visit.   There is no height or weight on file to calculate BMI.    ECOG FS:0 - Asymptomatic  General: Obese, no acute distress. Eyes: Pink conjunctiva, anicteric sclera. HEENT: Normocephalic, moist mucous membranes. Lungs: Clear to auscultation bilaterally. Heart: Regular rate and rhythm. No rubs, murmurs, or gallops. Abdomen: Soft, nontender, nondistended. No organomegaly noted, normoactive bowel sounds. Musculoskeletal: No edema, cyanosis, or clubbing. Neuro: Alert, answering all questions appropriately. Cranial nerves grossly intact. Skin: No rashes or petechiae noted. Psych: Normal affect.  LAB RESULTS:  Lab Results  Component Value Date   NA 138 12/14/2018   K 4.3 12/14/2018   CL 106 12/14/2018   CO2 22 12/14/2018   GLUCOSE 89 12/14/2018   BUN 12 12/14/2018   CREATININE 0.81 12/14/2018   CALCIUM 9.1 12/14/2018   PROT 7.0 12/14/2018   ALBUMIN 4.1 12/14/2018   AST 12 12/14/2018   ALT 9 12/14/2018   ALKPHOS 135 (H) 12/14/2018   BILITOT <0.2 12/14/2018   GFRNONAA 81 12/14/2018   GFRAA 94 12/14/2018    Lab Results  Component Value Date   WBC 2.9 (L) 12/14/2018   NEUTROABS 1.3 (L) 12/14/2018   HGB 10.3 (L) 12/14/2018   HCT 32.3 (L) 12/14/2018   MCV 85 12/14/2018   PLT 158 12/14/2018     STUDIES: No results found.  ASSESSMENT: Iron deficiency anemia.  PLAN:    1. Iron deficiency anemia: Patient's most recent hemoglobin has trended down and is now 9.6.  Her iron stores are slightly decreased as well.  Patient is also symptomatic. Previously, the entire work-up was either negative or within normal limits.  Proceed with 510 mg IV Feraheme today.  Return to clinic in 6 months with  repeat laboratory work and further evaluation.  Patient receives all of her laboratory work at [facility].   2.  Leukopenia: Chronic and unchanged. 3.  Insomnia: Chronic and unchanged.  Previously recommended patient discuss with her primary care physician to improve sleep habits.  I spent a total of 30 minutes face-to-face with the patient of which greater than 50% of the visit was spent in counseling and coordination of care as detailed above.   Patient expressed understanding and was in agreement with this plan. She also understands that She can call clinic at any time with any questions, concerns, or complaints.    Lloyd Huger, MD 02/14/19 7:05 AM

## 2019-02-15 ENCOUNTER — Inpatient Hospital Stay: Payer: 59

## 2019-02-15 ENCOUNTER — Inpatient Hospital Stay: Payer: 59 | Admitting: Oncology

## 2019-02-22 ENCOUNTER — Encounter: Payer: Self-pay | Admitting: Family Medicine

## 2019-02-22 ENCOUNTER — Ambulatory Visit (INDEPENDENT_AMBULATORY_CARE_PROVIDER_SITE_OTHER): Payer: 59 | Admitting: Family Medicine

## 2019-02-22 ENCOUNTER — Other Ambulatory Visit: Payer: Self-pay

## 2019-02-22 VITALS — BP 130/80 | HR 75 | Temp 96.9°F | Resp 14 | Ht 64.0 in | Wt 292.8 lb

## 2019-02-22 DIAGNOSIS — Z1231 Encounter for screening mammogram for malignant neoplasm of breast: Secondary | ICD-10-CM | POA: Diagnosis not present

## 2019-02-22 DIAGNOSIS — Z0000 Encounter for general adult medical examination without abnormal findings: Secondary | ICD-10-CM

## 2019-02-22 DIAGNOSIS — R9389 Abnormal findings on diagnostic imaging of other specified body structures: Secondary | ICD-10-CM | POA: Diagnosis not present

## 2019-02-22 NOTE — Progress Notes (Signed)
Name: Paula [name]   MRN: [id number]    DOB: [birth year]   Date:02/22/2019       Progress Note  Subjective  Chief Complaint  Chief Complaint  Patient presents with  . Annual Exam    HPI  Patient presents for annual CPE.  Diet:  Eats fast food Exercise: not currently   USPSTF grade A and B recommendations    Office Visit from 12/03/2018 in [facility]  AUDIT-C Score  0     Depression: Phq 9 is  negative Depression screen [facility] 2/9 02/22/2019 12/03/2018 07/07/2018 01/27/2018 06/23/2017  Decreased Interest 0 0 0 0 0  Down, Depressed, Hopeless 0 0 0 0 0  PHQ - 2 Score 0 0 0 0 0  Altered sleeping 0 0 0 - -  Tired, decreased energy 0 0 0 - -  Change in appetite 0 0 0 - -  Feeling bad or failure about yourself  0 0 0 - -  Trouble concentrating 0 0 0 - -  Moving slowly or fidgety/restless 0 0 0 - -  Suicidal thoughts 0 0 0 - -  PHQ-9 Score 0 0 0 - -  Difficult doing work/chores Not difficult at all Not difficult at all - - -   Hypertension: BP Readings from Last 3 Encounters:  02/22/19 130/80  12/03/18 130/80  05/07/18 130/90   Obesity: Wt Readings from Last 3 Encounters:  02/22/19 292 lb 12.8 oz (132.8 kg)  12/03/18 290 lb 4.8 oz (131.7 kg)  05/07/18 281 lb 14.4 oz (127.9 kg)   BMI Readings from Last 3 Encounters:  02/22/19 50.26 kg/m  12/03/18 49.83 kg/m  05/07/18 48.39 kg/m     Hep C Screening: up to date  STD testing and prevention (HIV/chl/gon/syphilis): N/A Intimate partner violence: negative screen  Sexual History (Partners/Practices/Protection from [employer] hx STI/Pregnancy Plans):N/A Pain during Intercourse: no sexually active  Menstrual History/LMP/Abnormal Bleeding: post-menopausal, discussed importance of coming in if any vaginal bleeding  Incontinence Symptoms: she has urgency, but no stress , not interested in medication  Breast cancer:  - Last Mammogram: 08/2017 - BRCA gene screening: N/A  Osteoporosis: Discussed high  calcium and vitamin D supplementation, weight bearing exercises  Cervical cancer screening: up to date, repeat in 2022  Skin cancer: Discussed monitoring for atypical lesions  Colorectal cancer: repeat in 2025 Lung cancer: Low Dose CT Chest recommended if Age 56-80 years, 30 pack-year currently smoking OR have quit w/in 15years. Patient does not qualify.   ECG: 04/2018  Advanced Care Planning: A voluntary discussion about advance care planning including the explanation and discussion of advance directives.  Discussed health care proxy and Living will, and the patient was able to identify a health care proxy as mother .  Patient does not have a living will at present time.  Lipids: Lab Results  Component Value Date   CHOL 228 (H) 12/14/2018   CHOL 194 07/07/2017   CHOL 220 (H) 12/31/2016   Lab Results  Component Value Date   HDL 95 12/14/2018   HDL 83 07/07/2017   HDL 89 12/31/2016   Lab Results  Component Value Date   LDLCALC 122 (H) 12/14/2018   LDLCALC 95 07/07/2017   LDLCALC 111 (H) 12/31/2016   Lab Results  Component Value Date   TRIG 62 12/14/2018   TRIG 82 07/07/2017   TRIG 99 12/31/2016   Lab Results  Component Value Date   CHOLHDL 2.4 12/14/2018   CHOLHDL 2.3 07/07/2017  CHOLHDL 2.5 12/31/2016   No results found for: LDLDIRECT  Glucose: Glucose  Date Value Ref Range Status  12/14/2018 89 65 - 99 mg/dL Final  05/08/2018 88 65 - 99 mg/dL Final  04/30/2018 94 65 - 99 mg/dL Final  04/09/2013 97 65 - 99 mg/dL Final  05/14/2011 93 65 - 99 mg/dL Final   Glucose, Bld  Date Value Ref Range Status  04/25/2018 108 (H) 70 - 99 mg/dL Final  04/17/2018 99 70 - 99 mg/dL Final    Patient Active Problem List   Diagnosis Date Noted  . 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 Pre-diabetes 01/01/2017  . 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 Plantar fasciitis, right 06/17/2016  . Osteoarthritis of right foot 06/17/2016  . Primary osteoarthritis,  right ankle and foot 09/13/2015  . Carpal tunnel syndrome, right 09/13/2015  . Tarsal tunnel syndrome of right side 09/13/2015  . 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 Asthma, mild intermittent 08/15/2014  . Benign hypertension 08/15/2014  . Herpes genitalis in women 08/15/2014  . Allergic rhinitis 08/15/2014  . 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 Vitamin D deficiency 08/15/2014  . Morbid obesity ([place]) 08/03/2014  . Adhesive capsulitis of left shoulder 06/16/2013  . Obstructive sleep apnea of adult 06/16/2013  . Epilepsy ([place]) 06/16/2013  . Anemia, iron deficiency 09/30/2006    Past Surgical History:  Procedure Laterality Date  . BREAST EXCISIONAL BIOPSY Left    age 39 tumor removed  . BREAST SURGERY Left    benign lump at age 66    Family History  Problem Relation Age of Onset  . Cancer Mother        Breast  . Hypertension Mother   . Diabetes Mother   . Hyperlipidemia Mother   . Breast cancer Mother     Social History   Socioeconomic History  . Marital status: Soil scientist    Spouse name: Erbin   . Number of children: 0  . Years of education: Not on file  . Highest education level: Associate degree: occupational, Hotel manager, or vocational program  Occupational History  . Occupation: lab specialist    Employer: [employer]  Tobacco Use  . Smoking status: Never Smoker  . Smokeless tobacco: Never Used  Substance and Sexual Activity  . Alcohol use: No    Alcohol/week: 0.0 standard drinks  . Drug use: No  . Sexual activity: Not Currently  Other Topics Concern  . Not on file  Social History Narrative   Lives with mother and boyfriend, currently mother is in a nursing home   Never had children    Works for [employer] Strain: [place]   . Difficulty of Paying Living Expenses: Not hard at all  Food Insecurity: No Food Insecurity  . Worried About Sales executive in the Last Year: Never true  . Ran Out of Food in the Last Year: Never true  Transportation Needs: No Transportation Needs  . Lack of Transportation (Medical): No  . Lack of Transportation (Non-Medical): No  Physical Activity: Inactive  . Days of Exercise per Week: 0 days  . Minutes of Exercise per Session: 0 min  Stress: No Stress Concern Present  . Feeling of Stress : Not at all  Social Connections: Not Isolated  . Frequency of Communication with Friends and Family: More than three times a week  . Frequency of  Social Gatherings with Friends and Family: Once a week  . Attends Religious Services: More than 4 times per year  . Active Member of Clubs or Organizations: Yes  . Attends Archivist Meetings: More than 4 times per year  . Marital Status: Living with partner  Intimate Partner Violence: Not At Risk  . Fear of Current or Ex-Partner: No  . Emotionally Abused: No  . Physically Abused: No  . Sexually Abused: No     Current Outpatient Medications:  .  albuterol (PROVENTIL HFA;VENTOLIN HFA) 108 (90 Base) MCG/ACT inhaler, Inhale 2 puffs every 6 (six) hours as needed into the lungs., Disp: 1 Inhaler, Rfl: 0 .  amLODipine (NORVASC) 5 MG tablet, Take 1 tablet (5 mg total) by mouth daily., Disp: 90 tablet, Rfl: 1 .  aspirin EC 81 MG tablet, Take 1 tablet (81 mg total) by mouth daily., Disp: 30 tablet, Rfl: 0 .  carbamazepine (TEGRETOL) 200 MG tablet, TAKE 1 AND 1/2 TABLETS BY  MOUTH IN THE MORNING AND  NOON AND 2 AND 1/2 TABLETS  BY MOUTH AT NIGHT, Disp: , Rfl:  .  cetirizine (ZYRTEC) 10 MG tablet, Take 1 tablet (10 mg total) by mouth daily. (Patient taking differently: Take 10 mg by mouth as needed. ), Disp: 30 tablet, Rfl: 1 .  fluticasone (FLONASE) 50 MCG/ACT nasal spray, Place 2 sprays into both nostrils daily., Disp: 48 g, Rfl: 1 .  levETIRAcetam (KEPPRA) 500 MG tablet, Take 3 tablets by mouth 2 (two) times daily., Disp: , Rfl:  .  omeprazole (PRILOSEC) 20 MG  capsule, Take 1 capsule (20 mg total) by mouth daily., Disp: 90 capsule, Rfl: 1 .  telmisartan-hydrochlorothiazide (MICARDIS HCT) 80-12.5 MG tablet, Take 1 tablet by mouth daily., Disp: 90 tablet, Rfl: 1 .  topiramate (TOPAMAX) 100 MG tablet, Take 3 tablets by mouth 3 (three) times daily., Disp: , Rfl:  .  valACYclovir (VALTREX) 1000 MG tablet, Take 1 tablet (1,000 mg total) by mouth 2 (two) times daily., Disp: 20 tablet, Rfl: 0 .  PREVIDENT 5000 BOOSTER PLUS 1.1 % PSTE, , Disp: , Rfl:   Allergies  Allergen Reactions  . Ace Inhibitors      ROS  Constitutional: Negative for fever or weight change.  Respiratory: Negative for cough and shortness of breath.   Cardiovascular: Negative for chest pain or palpitations.  Gastrointestinal: Negative for abdominal pain, no bowel changes.  Musculoskeletal: Negative for gait problem or joint swelling.  Skin: Negative for rash.  Neurological: Negative for dizziness or headache.  No other specific complaints in a complete review of systems (except as listed in HPI above).   Objective  Vitals:   02/22/19 1511 02/22/19 1520 02/22/19 1521  BP: 140/70 140/80 130/80  Pulse: 75    Resp: 14    Temp: (!) 96.9 F (36.1 C)    SpO2: 100%    Weight: 292 lb 12.8 oz (132.8 kg)    Height: '5\' 4"'  (1.626 m)      Body mass index is 50.26 kg/m.  Physical Exam  Constitutional: Patient appears well-developed and well-nourished. No distress.  HENT: Head: Normocephalic and atraumatic. Ears: B TMs ok, no erythema or effusion; Nose and oral exam not done Eyes: Conjunctivae and EOM are normal. Pupils are equal, round, and reactive to light. No scleral icterus.  Neck: Normal range of motion. Neck supple. No JVD present. No thyromegaly present.  Cardiovascular: Normal rate, regular rhythm and normal heart sounds.  No murmur heard. No BLE edema.  Pulmonary/Chest: Effort normal and breath sounds normal. No respiratory distress. Abdominal: Soft. Bowel sounds are  normal, no distension. There is no tenderness. no masses Breast: no lumps or masses, no nipple discharge or rashes FEMALE GENITALIA:  Not done RECTAL: not done Musculoskeletal: Normal range of motion, no joint effusions. No gross deformities Neurological: he is alert and oriented to person, place, and time. No cranial nerve deficit. Coordination, balance, strength, speech and gait are normal.  Skin: Skin is warm and dry. No rash noted. No erythema.  Psychiatric: Patient has a normal mood and affect. behavior is normal. Judgment and thought content normal.   Recent Results (from the past 2160 hour(s))  Lipid panel     Status: Abnormal   Collection Time: 12/14/18 11:01 AM  Result Value Ref Range   Cholesterol, Total 228 (H) 100 - 199 mg/dL   Triglycerides 62 0 - 149 mg/dL   HDL 95 >39 mg/dL   VLDL Cholesterol Cal 11 5 - 40 mg/dL   LDL Chol Calc (NIH) 122 (H) 0 - 99 mg/dL   Chol/HDL Ratio 2.4 0.0 - 4.4 ratio    Comment:                                   T. Chol/HDL Ratio                                             Men  Women                               1/2 Avg.Risk  3.4    3.3                                   Avg.Risk  5.0    4.4                                2X Avg.Risk  9.6    7.1                                3X Avg.Risk 23.4   11.0   Comprehensive metabolic panel     Status: Abnormal   Collection Time: 12/14/18 11:01 AM  Result Value Ref Range   Glucose 89 65 - 99 mg/dL   BUN 12 6 - 24 mg/dL   Creatinine, Ser 0.81 0.57 - 1.00 mg/dL   GFR calc non Af Amer 81 >59 mL/min/1.73   GFR calc Af Amer 94 >59 mL/min/1.73   BUN/Creatinine Ratio 15 9 - 23   Sodium 138 134 - 144 mmol/L   Potassium 4.3 3.5 - 5.2 mmol/L   Chloride 106 96 - 106 mmol/L   CO2 22 20 - 29 mmol/L   Calcium 9.1 8.7 - 10.2 mg/dL   Total Protein 7.0 6.0 - 8.5 g/dL   Albumin 4.1 3.8 - 4.9 g/dL   Globulin, Total 2.9 1.5 - 4.5 g/dL   Albumin/Globulin Ratio 1.4 1.2 - 2.2   Bilirubin Total <0.2 0.0 - 1.2 mg/dL    Alkaline Phosphatase 135 (H) 39 -  117 IU/L   AST 12 0 - 40 IU/L   ALT 9 0 - 32 IU/L  CBC with Differential/Platelet     Status: Abnormal   Collection Time: 12/14/18 11:01 AM  Result Value Ref Range   WBC 2.9 (L) 3.4 - 10.8 x10E3/uL   RBC 3.81 3.77 - 5.28 x10E6/uL   Hemoglobin 10.3 (L) 11.1 - 15.9 g/dL   Hematocrit 32.3 (L) 34.0 - 46.6 %   MCV 85 79 - 97 fL   MCH 27.0 26.6 - 33.0 pg   MCHC 31.9 31.5 - 35.7 g/dL   RDW 13.8 11.7 - 15.4 %   Platelets 158 150 - 450 x10E3/uL   Neutrophils 47 Not Estab. %   Lymphs 35 Not Estab. %   Monocytes 8 Not Estab. %   Eos 9 Not Estab. %   Basos 1 Not Estab. %   Neutrophils Absolute 1.3 (L) 1.4 - 7.0 x10E3/uL   Lymphocytes Absolute 1.0 0.7 - 3.1 x10E3/uL   Monocytes Absolute 0.2 0.1 - 0.9 x10E3/uL   EOS (ABSOLUTE) 0.3 0.0 - 0.4 x10E3/uL   Basophils Absolute 0.0 0.0 - 0.2 x10E3/uL   Immature Granulocytes 0 Not Estab. %   Immature Grans (Abs) 0.0 0.0 - 0.1 x10E3/uL  Hemoglobin A1c     Status: Abnormal   Collection Time: 12/14/18 11:01 AM  Result Value Ref Range   Hgb A1c MFr Bld 5.7 (H) 4.8 - 5.6 %    Comment:          Prediabetes: 5.7 - 6.4          Diabetes: >6.4          Glycemic control for adults with diabetes: <7.0    Est. average glucose Bld gHb Est-mCnc 117 mg/dL  Iron, TIBC and Ferritin Panel     Status: Abnormal   Collection Time: 12/14/18 11:01 AM  Result Value Ref Range   Total Iron Binding Capacity 197 (L) 250 - 450 ug/dL   UIBC 131 131 - 425 ug/dL   Iron 66 27 - 159 ug/dL   Iron Saturation 34 15 - 55 %   Ferritin 130 15 - 150 ng/mL  Vitamin B12     Status: None   Collection Time: 12/14/18 11:01 AM  Result Value Ref Range   Vitamin B-12 301 232 - 1,245 pg/mL  VITAMIN D 25 Hydroxy (Vit-D Deficiency, Fractures)     Status: Abnormal   Collection Time: 12/14/18 11:01 AM  Result Value Ref Range   Vit D, 25-Hydroxy 10.3 (L) 30.0 - 100.0 ng/mL    Comment: Vitamin D deficiency has been defined by the Institute of Medicine and  an Endocrine Society practice guideline as a level of serum 25-OH vitamin D less than 20 ng/mL (1,2). The Endocrine Society went on to further define vitamin D insufficiency as a level between 21 and 29 ng/mL (2). 1. IOM (Institute of Medicine). 2010. Dietary reference    intakes for calcium and D. [place]: The    [employer]. 2. Holick MF, Binkley Addison, Bischoff-Ferrari HA, et al.    Evaluation, treatment, and prevention of vitamin D    deficiency: an Endocrine Society clinical practice    guideline. JCEM. 2011 Jul; 96(7):1911-30.   Carbamazepine Level (Tegretol), total     Status: None   Collection Time: 12/14/18 11:01 AM  Result Value Ref Range   Carbamazepine (Tegretol), S 10.8 4.0 - 12.0 ug/mL    Comment:  In conjunction with other antiepileptic drugs                                Therapeutic  4.0 -  8.0                                Toxicity     9.0 - 12.0                                    Carbamazepine alone                                Therapeutic  8.0 - 12.0                                 Detection Limit =  2.0                           <2.0 indicated None Detected   Levetiracetam level     Status: Abnormal   Collection Time: 12/14/18 11:01 AM  Result Value Ref Range   Levetiracetam Lvl 57.3 (H) 10.0 - 40.0 ug/mL    Comment: This test was developed and its performance characteristics determined by [facility]. It has not been cleared or approved by the Food and Drug Administration.       Fall Risk: Fall Risk  02/22/2019 12/03/2018 07/07/2018 05/07/2018 04/29/2018  Falls in the past year? 0 0 0 0 0  Number falls in past yr: 0 0 0 0 -  Injury with Fall? 0 0 0 0 -  Comment - - - - -  Follow up - - - - -     Functional Status Survey: Is the patient deaf or have difficulty hearing?: No Does the patient have difficulty seeing, even when wearing glasses/contacts?: No Does the patient have difficulty concentrating, remembering, or making  decisions?: No Does the patient have difficulty walking or climbing stairs?: No Does the patient have difficulty dressing or bathing?: No Does the patient have difficulty doing errands alone such as visiting a doctor's office or shopping?: No   Assessment & Plan  1. Well adult exam   2. Breast cancer screening by mammogram  - MM 3D SCREEN BREAST BILATERAL; Future  3. Abnormal finding present on diagnostic imaging of uterus  We will make sure it gest scheduled for her   -USPSTF grade A and B recommendations reviewed with patient; age-appropriate recommendations, preventive care, screening tests, etc discussed and encouraged; healthy living encouraged; see AVS for patient education given to patient -Discussed importance of 150 minutes of physical activity weekly, eat two servings of fish weekly, eat one serving of tree nuts ( cashews, pistachios, pecans, almonds.Marland Kitchen) every other day, eat 6 servings of fruit/vegetables daily and drink plenty of water and avoid sweet beverages.

## 2019-02-22 NOTE — Patient Instructions (Signed)

## 2019-04-11 ENCOUNTER — Other Ambulatory Visit: Payer: Self-pay | Admitting: Family Medicine

## 2019-04-11 DIAGNOSIS — I10 Essential (primary) hypertension: Secondary | ICD-10-CM

## 2019-05-17 ENCOUNTER — Ambulatory Visit (INDEPENDENT_AMBULATORY_CARE_PROVIDER_SITE_OTHER): Payer: 59 | Admitting: Family Medicine

## 2019-05-17 ENCOUNTER — Encounter: Payer: Self-pay | Admitting: Family Medicine

## 2019-05-17 ENCOUNTER — Other Ambulatory Visit: Payer: Self-pay

## 2019-05-17 VITALS — BP 126/78 | HR 100 | Temp 97.5°F | Resp 18 | Ht 64.0 in | Wt 305.2 lb

## 2019-05-17 DIAGNOSIS — R569 Unspecified convulsions: Secondary | ICD-10-CM

## 2019-05-17 DIAGNOSIS — R7303 Prediabetes: Secondary | ICD-10-CM

## 2019-05-17 DIAGNOSIS — G4733 Obstructive sleep apnea (adult) (pediatric): Secondary | ICD-10-CM

## 2019-05-17 DIAGNOSIS — E7800 Pure hypercholesterolemia, unspecified: Secondary | ICD-10-CM | POA: Diagnosis not present

## 2019-05-17 DIAGNOSIS — J4520 Mild intermittent asthma, uncomplicated: Secondary | ICD-10-CM

## 2019-05-17 DIAGNOSIS — I10 Essential (primary) hypertension: Secondary | ICD-10-CM | POA: Diagnosis not present

## 2019-05-17 DIAGNOSIS — E559 Vitamin D deficiency, unspecified: Secondary | ICD-10-CM

## 2019-05-17 DIAGNOSIS — D509 Iron deficiency anemia, unspecified: Secondary | ICD-10-CM

## 2019-05-17 MED ORDER — BUDESONIDE-FORMOTEROL FUMARATE 160-4.5 MCG/ACT IN AERO: 2.0000 | INHALATION_SPRAY | Freq: Two times a day (BID) | RESPIRATORY_TRACT | 3 refills | Status: DC

## 2019-05-17 MED ORDER — VITAMIN D (ERGOCALCIFEROL) 1.25 MG (50000 UNIT) PO CAPS: 50000.0000 [IU] | ORAL_CAPSULE | ORAL | 1 refills | Status: DC

## 2019-05-17 MED ORDER — AMLODIPINE BESYLATE 5 MG PO TABS: 5.0000 mg | ORAL_TABLET | Freq: Every day | ORAL | 1 refills | Status: DC

## 2019-05-17 NOTE — Progress Notes (Signed)
Name: Paula [name]   MRN: [id number]    DOB: Jun 07, 1960   Date:05/17/2019       Progress Note  Subjective  Chief Complaint  Chief Complaint  Patient presents with  . Medication Refill  . Hypertension  . Hyperlipidemia  . Asthma  . Sleep Apnea  . Seizures    HPI  HTN: she is compliant with her bp medication, no chest pain or plpitation  Hyperlipidemia:  Her heart attacks strokes risk is low and we will continue to monitor for now The 10-year ASCVD risk score Mikey Bussing DC Brooke Bonito., et al., 2013) is: 3.5%   Values used to calculate the score:     Age: 59 years     Sex: Female     Is Non-Hispanic African American: Yes     Diabetic: No     Tobacco smoker: No     Systolic Blood Pressure: 510 mmHg     Is BP treated: Yes     HDL Cholesterol: 95 mg/dL     Total Cholesterol: 228 mg/dL  Seizure disorder: [facility] neurologist,last visit October 2020, his lamotrigine was high but she has been on the same dose . No episodes of seizure in years and is able to drive   Asthma mild: she states currently no problems, she states she has been using Symbicort since the pollen count has been up to control her dry cough, and it is working well for her. No sob or wheezing   OSA: mild, she used to wear CPAP, lost weight and stopped, but even though she has gained weight she does not want to resume CPAP machine yet. She denies morning headaches, she wakes up with dry mouth. She is sleepy when she gets home at 7 pm. Her schedule is 10 am to 7 pm   Morbid Obesity: her weight has gone up, she has gained over 20 lbs in the past year, she states eating more since her mother died in 2022-04-16. She states she will lose it back. She will resume physical activity , she will walk 3 times week for 15 minutes and go up as tolerated. She will try to eat more salads and eat honey nut cherios.    Patient Active Problem List   Diagnosis Date Noted  . 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 Pre-diabetes 01/01/2017  .  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 Plantar fasciitis, right 06/17/2016  . Osteoarthritis of right foot 06/17/2016  . Primary osteoarthritis, right ankle and foot 09/13/2015  . Carpal tunnel syndrome, right 09/13/2015  . Tarsal tunnel syndrome of right side 09/13/2015  . 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 Asthma, mild intermittent 08/15/2014  . Benign hypertension 08/15/2014  . Herpes genitalis in women 08/15/2014  . Allergic rhinitis 08/15/2014  . Cerebral seizure ([place]) 08/15/2014  . Vitamin D deficiency 08/15/2014  . Morbid obesity ([place]) 08/03/2014  . Adhesive capsulitis of left shoulder 06/16/2013  . Obstructive sleep apnea of adult 06/16/2013  . Epilepsy ([place]) 06/16/2013  . Anemia, iron deficiency 09/30/2006    Past Surgical History:  Procedure Laterality Date  . BREAST EXCISIONAL BIOPSY Left    age 59 tumor removed  . BREAST SURGERY Left    benign lump at age 44    Family History  Problem Relation Age of Onset  . Cancer Mother        Breast  . Hypertension Mother   . Diabetes Mother   .  Hyperlipidemia Mother   . Breast cancer Mother     Social History   Tobacco Use  . Smoking status: Never Smoker  . Smokeless tobacco: Never Used  Substance Use Topics  . Alcohol use: No    Alcohol/week: 0.0 standard drinks     Current Outpatient Medications:  .  albuterol (PROVENTIL HFA;VENTOLIN HFA) 108 (90 Base) MCG/ACT inhaler, Inhale 2 puffs every 6 (six) hours as needed into the lungs., Disp: 1 Inhaler, Rfl: 0 .  amLODipine (NORVASC) 5 MG tablet, Take 1 tablet (5 mg total) by mouth daily., Disp: 90 tablet, Rfl: 1 .  aspirin EC 81 MG tablet, Take 1 tablet (81 mg total) by mouth daily., Disp: 30 tablet, Rfl: 0 .  carbamazepine (TEGRETOL) 200 MG tablet, TAKE 1 AND 1/2 TABLETS BY  MOUTH IN THE MORNING AND  NOON AND 2 AND 1/2 TABLETS  BY MOUTH AT NIGHT, Disp: , Rfl:  .  cetirizine (ZYRTEC) 10 MG tablet, Take 1  tablet (10 mg total) by mouth daily. (Patient taking differently: Take 10 mg by mouth as needed. ), Disp: 30 tablet, Rfl: 1 .  fluticasone (FLONASE) 50 MCG/ACT nasal spray, Place 2 sprays into both nostrils daily., Disp: 48 g, Rfl: 1 .  levETIRAcetam (KEPPRA) 500 MG tablet, Take 3 tablets by mouth 2 (two) times daily., Disp: , Rfl:  .  omeprazole (PRILOSEC) 20 MG capsule, Take 1 capsule (20 mg total) by mouth daily., Disp: 90 capsule, Rfl: 1 .  PREVIDENT 5000 BOOSTER PLUS 1.1 % PSTE, , Disp: , Rfl:  .  telmisartan-hydrochlorothiazide (MICARDIS HCT) 80-12.5 MG tablet, TAKE 1 TABLET BY MOUTH DAILY, Disp: 90 tablet, Rfl: 1 .  topiramate (TOPAMAX) 100 MG tablet, Take 3 tablets by mouth 3 (three) times daily., Disp: , Rfl:  .  valACYclovir (VALTREX) 1000 MG tablet, Take 1 tablet (1,000 mg total) by mouth 2 (two) times daily., Disp: 20 tablet, Rfl: 0 .  budesonide-formoterol (SYMBICORT) 160-4.5 MCG/ACT inhaler, Inhale 2 puffs into the lungs 2 (two) times daily., Disp: 1 Inhaler, Rfl: 3 .  Vitamin D, Ergocalciferol, (DRISDOL) 1.25 MG (50000 UNIT) CAPS capsule, Take 1 capsule (50,000 Units total) by mouth every 7 (seven) days., Disp: 12 capsule, Rfl: 1  Allergies  Allergen Reactions  . Ace Inhibitors     I personally reviewed active problem list, medication list, allergies, family history, social history, health maintenance with the patient/caregiver today.   ROS  Constitutional: Negative for fever, positive for  weight change.  Respiratory: Negative for cough and shortness of breath.   Cardiovascular: Negative for chest pain or palpitations.  Gastrointestinal: Negative for abdominal pain, no bowel changes.  Musculoskeletal: Negative for gait problem or joint swelling.  Skin: Negative for rash.  Neurological: Negative for dizziness or headache.  No other specific complaints in a complete review of systems (except as listed in HPI above).   Objective  Vitals:   05/17/19 1435  BP: 126/78   Pulse: 100  Resp: 18  Temp: (!) 97.5 F (36.4 C)  TempSrc: Temporal  SpO2: 98%  Weight: (!) 305 lb 4 oz (138.5 kg)  Height: 5\' 4"  (1.626 m)    Body mass index is 52.4 kg/m.  Physical Exam  Constitutional: Patient appears well-developed and well-nourished. Obese  No distress.  HEENT: head atraumatic, normocephalic, pupils equal and reactive to light Cardiovascular: Normal rate, regular rhythm and normal heart sounds.  No murmur heard. No BLE edema. Pulmonary/Chest: Effort normal and breath sounds normal. No respiratory distress. Abdominal: Soft.  There is no tenderness. Psychiatric: Patient has a normal mood and affect. behavior is normal. Judgment and thought content normal.  PHQ2/9: Depression screen [facility] 2/9 02/22/2019 12/03/2018 07/07/2018 01/27/2018 06/23/2017  Decreased Interest 0 0 0 0 0  Down, Depressed, Hopeless 0 0 0 0 0  PHQ - 2 Score 0 0 0 0 0  Altered sleeping 0 0 0 - -  Tired, decreased energy 0 0 0 - -  Change in appetite 0 0 0 - -  Feeling bad or failure about yourself  0 0 0 - -  Trouble concentrating 0 0 0 - -  Moving slowly or fidgety/restless 0 0 0 - -  Suicidal thoughts 0 0 0 - -  PHQ-9 Score 0 0 0 - -  Difficult doing work/chores Not difficult at all Not difficult at all - - -    phq 9 is negative   Fall Risk: Fall Risk  05/17/2019 02/22/2019 12/03/2018 07/07/2018 05/07/2018  Falls in the past year? 0 0 0 0 0  Number falls in past yr: 0 0 0 0 0  Injury with Fall? 0 0 0 0 0  Comment - - - - -  Follow up - - - - -     Functional Status Survey: Is the patient deaf or have difficulty hearing?: No Does the patient have difficulty seeing, even when wearing glasses/contacts?: Yes Does the patient have difficulty concentrating, remembering, or making decisions?: No Does the patient have difficulty walking or climbing stairs?: No Does the patient have difficulty dressing or bathing?: No Does the patient have difficulty doing errands alone such as visiting  a doctor's office or shopping?: No    Assessment & Plan  1. Seizure (HCC)  Continue medication   2. Morbid obesity (HCC)  Discussed with the patient the risk posed by an increased BMI. Discussed importance of portion control, calorie counting and at least 150 minutes of physical activity weekly. Avoid sweet beverages and drink more water. Eat at least 6 servings of fruit and vegetables daily   3. Benign hypertension  - amLODipine (NORVASC) 5 MG tablet; Take 1 tablet (5 mg total) by mouth daily.  Dispense: 90 tablet; Refill: 1  4. Pure hypercholesterolemia   5. Obstructive sleep apnea of adult  She needs to resume CPAP   6. Pre-diabetes  Discussed importance of weight loss   7. Vitamin D deficiency  - Vitamin D, Ergocalciferol, (DRISDOL) 1.25 MG (50000 UNIT) CAPS capsule; Take 1 capsule (50,000 Units total) by mouth every 7 (seven) days.  Dispense: 12 capsule; Refill: 1  8. Iron deficiency anemia, unspecified iron deficiency anemia type  Keep follow up with hematologist   9. Mild intermittent asthma without complication  Using Symbicort and is doing well

## 2019-06-01 ENCOUNTER — Encounter: Payer: Self-pay | Admitting: Oncology

## 2019-06-05 NOTE — Progress Notes (Signed)
[facility]  [phone] [phone]  ID: Paula [name] OB: [birth year]  MR#: [id number]  EXH#:[id number]  Patient Care Team: Alba Cory, MD as PCP - General (Family Medicine) Gwyneth Revels, DPM as Consulting Physician (Podiatry) Bonnee Quin, MD as Consulting Physician (Neurology) Alwyn Pea, MD as Consulting Physician (Cardiology)  CHIEF COMPLAINT: Iron deficiency anemia.  INTERVAL HISTORY: Patient returns to clinic today for repeat laboratory work and routine 57-month evaluation.  She has mild depression secondary to the recent death of her mother.  She continues to have chronic weakness and fatigue.  She has no neurologic complaints. She denies fever or chills.  She has a fair appetite and denies weight loss.  She has no chest pain, shortness of breath, cough, or hemoptysis.  She denies any nausea, vomiting, constipation, or diarrhea.  She has no melena or hematochezia.  She has no urinary complaints.  Patient offers no further specific complaints today.  REVIEW OF SYSTEMS:   Review of Systems  Constitutional: Positive for malaise/fatigue. Negative for fever and weight loss.  Respiratory: Negative.  Negative for cough and shortness of breath.   Cardiovascular: Negative.  Negative for chest pain and leg swelling.  Gastrointestinal: Negative.  Negative for abdominal pain, blood in stool and melena.  Genitourinary: Negative.  Negative for dysuria.  Musculoskeletal: Negative.  Negative for back pain.  Skin: Negative.  Negative for rash.  Neurological: Positive for weakness. Negative for sensory change and focal weakness.  Psychiatric/Behavioral: Positive for depression. The patient is not nervous/anxious and does not have insomnia.     As per HPI. Otherwise, a complete review of systems is negative.  PAST MEDICAL HISTORY: Anemia, hypertension, seizure disorder, sleep apnea, hypertension, left breast lumpectomy with Dr. Lemar Livings.  Colonoscopy in 2004.  PAST SURGICAL HISTORY: Past Surgical History:  Procedure Laterality Date  . BREAST EXCISIONAL BIOPSY Left    age 59 tumor removed  . BREAST SURGERY Left    benign lump at age 59    FAMILY HISTORY Family History  Problem Relation Age of Onset  . Cancer Mother        Breast  . Hypertension Mother   . Diabetes Mother   . Hyperlipidemia Mother   . Breast cancer Mother        ADVANCED DIRECTIVES:    HEALTH MAINTENANCE: Social History   Tobacco Use  . Smoking status: Never Smoker  . Smokeless tobacco: Never Used  Substance Use Topics  . Alcohol use: No    Alcohol/week: 0.0 standard drinks  . Drug use: No     Allergies  Allergen Reactions  . Ace Inhibitors     Current Outpatient Medications  Medication Sig Dispense Refill  . albuterol (PROVENTIL HFA;VENTOLIN HFA) 108 (90 Base) MCG/ACT inhaler Inhale 2 puffs every 6 (six) hours as needed into the lungs. 1 Inhaler 0  . amLODipine (NORVASC) 5 MG tablet Take 1 tablet (5 mg total) by mouth daily. 90 tablet 1  . aspirin EC 81 MG tablet Take 1 tablet (81 mg total) by mouth daily. 30 tablet 0  . budesonide-formoterol (SYMBICORT) 160-4.5 MCG/ACT inhaler Inhale 2 puffs into the lungs 2 (two) times daily. 1 Inhaler 3  . carbamazepine (TEGRETOL) 200 MG tablet TAKE 1 AND 1/2 TABLETS BY  MOUTH IN THE MORNING AND  NOON AND 2 AND 1/2 TABLETS  BY MOUTH AT NIGHT    . cetirizine (ZYRTEC) 10 MG tablet Take 1 tablet (10 mg total) by mouth daily. (Patient taking differently:  Take 10 mg by mouth as needed. ) 30 tablet 1  . fluticasone (FLONASE) 50 MCG/ACT nasal spray Place 2 sprays into both nostrils daily. 48 g 1  . levETIRAcetam (KEPPRA) 500 MG tablet Take 3 tablets by mouth 2 (two) times daily.    Marland Kitchen omeprazole (PRILOSEC) 20 MG capsule Take 1 capsule (20 mg total) by mouth daily. 90 capsule 1  . PREVIDENT 5000 BOOSTER PLUS 1.1 % PSTE     . telmisartan-hydrochlorothiazide (MICARDIS HCT) 80-12.5 MG tablet TAKE 1 TABLET  BY MOUTH DAILY 90 tablet 1  . topiramate (TOPAMAX) 100 MG tablet Take 3 tablets by mouth 3 (three) times daily.    . valACYclovir (VALTREX) 1000 MG tablet Take 1 tablet (1,000 mg total) by mouth 2 (two) times daily. 20 tablet 0  . Vitamin D, Ergocalciferol, (DRISDOL) 1.25 MG (50000 UNIT) CAPS capsule Take 1 capsule (50,000 Units total) by mouth every 7 (seven) days. 12 capsule 1   No current facility-administered medications for this visit.    OBJECTIVE: Vitals:   06/07/19 1308  BP: 130/77  Pulse: 80  Temp: (!) 97.3 F (36.3 C)  SpO2: 100%     Body mass index is 53.16 kg/m.    ECOG FS:0 - Asymptomatic  General: Well-developed, well-nourished, no acute distress. Eyes: Pink conjunctiva, anicteric sclera. HEENT: Normocephalic, moist mucous membranes. Lungs: No audible wheezing or coughing. Heart: Regular rate and rhythm. Abdomen: Soft, nontender, no obvious distention. Musculoskeletal: No edema, cyanosis, or clubbing. Neuro: Alert, answering all questions appropriately. Cranial nerves grossly intact. Skin: No rashes or petechiae noted. Psych: Normal affect.   LAB RESULTS:  Lab Results  Component Value Date   NA 138 12/14/2018   K 4.3 12/14/2018   CL 106 12/14/2018   CO2 22 12/14/2018   GLUCOSE 89 12/14/2018   BUN 12 12/14/2018   CREATININE 0.81 12/14/2018   CALCIUM 9.1 12/14/2018   PROT 7.0 12/14/2018   ALBUMIN 4.1 12/14/2018   AST 12 12/14/2018   ALT 9 12/14/2018   ALKPHOS 135 (H) 12/14/2018   BILITOT <0.2 12/14/2018   GFRNONAA 81 12/14/2018   GFRAA 94 12/14/2018    Lab Results  Component Value Date   WBC 2.9 (L) 12/14/2018   NEUTROABS 1.3 (L) 12/14/2018   HGB 10.3 (L) 12/14/2018   HCT 32.3 (L) 12/14/2018   MCV 85 12/14/2018   PLT 158 12/14/2018     STUDIES: No results found.  ASSESSMENT: Iron deficiency anemia.  PLAN:    1. Iron deficiency anemia: Patient's most recent hemoglobin is 10.6 and her iron stores are within normal limits. Previously,  the entire work-up was either negative or within normal limits.  She does not require additional Feraheme today.  Patient last received treatment on February 23, 2018.  No interventions are needed at this time.  No further follow-up is necessary.  Please continue to monitor patient's blood work 1-2 times per year and if her hemoglobin falls below 10.0 with reduced iron stores, please refer her back for further evaluation.   2.  Leukopenia: Chronic and unchanged. 3.  Insomnia: Chronic and unchanged.    I spent a total of 20 minutes reviewing chart data, face-to-face evaluation with the patient, counseling and coordination of care as detailed above.    Patient expressed understanding and was in agreement with this plan. She also understands that She can call clinic at any time with any questions, concerns, or complaints.    Lloyd Huger, MD 06/07/19 3:29 PM

## 2019-06-07 ENCOUNTER — Encounter: Payer: Self-pay | Admitting: Oncology

## 2019-06-07 ENCOUNTER — Inpatient Hospital Stay: Payer: 59 | Attending: Oncology | Admitting: Oncology

## 2019-06-07 ENCOUNTER — Inpatient Hospital Stay: Payer: 59

## 2019-06-07 ENCOUNTER — Ambulatory Visit: Payer: 59 | Admitting: Family Medicine

## 2019-06-07 VITALS — BP 130/77 | HR 80 | Temp 97.3°F | Wt 309.7 lb

## 2019-06-07 DIAGNOSIS — G4730 Sleep apnea, unspecified: Secondary | ICD-10-CM | POA: Diagnosis not present

## 2019-06-07 DIAGNOSIS — G4700 Insomnia, unspecified: Secondary | ICD-10-CM | POA: Insufficient documentation

## 2019-06-07 DIAGNOSIS — Z8249 Family history of ischemic heart disease and other diseases of the circulatory system: Secondary | ICD-10-CM | POA: Diagnosis not present

## 2019-06-07 DIAGNOSIS — R5383 Other fatigue: Secondary | ICD-10-CM | POA: Insufficient documentation

## 2019-06-07 DIAGNOSIS — Z8349 Family history of other endocrine, nutritional and metabolic diseases: Secondary | ICD-10-CM | POA: Diagnosis not present

## 2019-06-07 DIAGNOSIS — Z79899 Other long term (current) drug therapy: Secondary | ICD-10-CM | POA: Diagnosis not present

## 2019-06-07 DIAGNOSIS — Z833 Family history of diabetes mellitus: Secondary | ICD-10-CM | POA: Diagnosis not present

## 2019-06-07 DIAGNOSIS — D509 Iron deficiency anemia, unspecified: Secondary | ICD-10-CM | POA: Diagnosis not present

## 2019-06-07 DIAGNOSIS — R531 Weakness: Secondary | ICD-10-CM | POA: Diagnosis not present

## 2019-06-07 DIAGNOSIS — D72819 Decreased white blood cell count, unspecified: Secondary | ICD-10-CM | POA: Diagnosis not present

## 2019-06-07 DIAGNOSIS — Z803 Family history of malignant neoplasm of breast: Secondary | ICD-10-CM | POA: Insufficient documentation

## 2019-06-07 DIAGNOSIS — G40909 Epilepsy, unspecified, not intractable, without status epilepticus: Secondary | ICD-10-CM | POA: Insufficient documentation

## 2019-06-07 DIAGNOSIS — F329 Major depressive disorder, single episode, unspecified: Secondary | ICD-10-CM | POA: Insufficient documentation

## 2019-06-07 DIAGNOSIS — I10 Essential (primary) hypertension: Secondary | ICD-10-CM | POA: Diagnosis not present

## 2019-07-11 ENCOUNTER — Other Ambulatory Visit: Payer: Self-pay | Admitting: Family Medicine

## 2019-07-11 DIAGNOSIS — K219 Gastro-esophageal reflux disease without esophagitis: Secondary | ICD-10-CM

## 2019-07-11 NOTE — Telephone Encounter (Signed)
Requested Prescriptions  Pending Prescriptions Disp Refills  . omeprazole (PRILOSEC) 20 MG capsule [Pharmacy Med Name: OMEPRAZOLE 20MG  CAPSULES] 90 capsule 1    Sig: TAKE 1 CAPSULE(20 MG) BY MOUTH DAILY     Gastroenterology: Proton Pump Inhibitors Passed - 07/11/2019  3:29 AM      Passed - Valid encounter within last 12 months    Recent Outpatient Visits          1 month ago Seizure [facility])   [facility], MD   4 months ago Well adult exam   [facility], MD   7 months ago Seizure [facility])   [facility], MD   1 year ago Benign hypertension   [facility], MD   1 year ago Elevated lipase   [facility], MD      Future Appointments            In 4 months Alba Cory, Carlynn Purl, MD [facility], [facility]

## 2019-07-14 ENCOUNTER — Other Ambulatory Visit: Payer: Self-pay | Admitting: Family Medicine

## 2019-07-14 DIAGNOSIS — R9389 Abnormal findings on diagnostic imaging of other specified body structures: Secondary | ICD-10-CM

## 2019-07-23 ENCOUNTER — Other Ambulatory Visit: Payer: Self-pay | Admitting: Family Medicine

## 2019-07-23 DIAGNOSIS — R1010 Upper abdominal pain, unspecified: Secondary | ICD-10-CM

## 2019-07-23 DIAGNOSIS — R634 Abnormal weight loss: Secondary | ICD-10-CM

## 2019-07-23 DIAGNOSIS — R1013 Epigastric pain: Secondary | ICD-10-CM

## 2019-07-26 ENCOUNTER — Other Ambulatory Visit: Payer: Self-pay | Admitting: Family Medicine

## 2019-07-27 ENCOUNTER — Telehealth: Payer: Self-pay

## 2019-07-27 ENCOUNTER — Ambulatory Visit: Admission: RE | Admit: 2019-07-27 | Payer: 59 | Source: Ambulatory Visit

## 2019-07-27 NOTE — Telephone Encounter (Signed)
Copied from CRM [id number]. Topic: General - Call Back - No Documentation >> Jul 26, 2019  2:21 PM Reuben Likes D wrote: Reason for CRM: Pt is requesting call back from nurse regarding imaging appt, and wants to know if it needs to be done.   Called patient. No answer. I left a vm explaining to her that since she was not having any symptoms insurance would not cover her scan. I told her that he appointment will be cancelled.

## 2019-08-31 ENCOUNTER — Other Ambulatory Visit: Payer: Self-pay | Admitting: Family Medicine

## 2019-08-31 DIAGNOSIS — I10 Essential (primary) hypertension: Secondary | ICD-10-CM

## 2019-11-07 ENCOUNTER — Other Ambulatory Visit: Payer: Self-pay | Admitting: Family Medicine

## 2019-11-07 DIAGNOSIS — E559 Vitamin D deficiency, unspecified: Secondary | ICD-10-CM

## 2019-11-07 NOTE — Telephone Encounter (Signed)
Requested medication (s) are due for refill today: yes  Requested medication (s) are on the active medication list: yes  Last refill:  05/17/19  Future visit scheduled: yes  Notes to clinic:  med not delegated to NT to RF   Requested Prescriptions  Pending Prescriptions Disp Refills   Vitamin D, Ergocalciferol, (DRISDOL) 1.25 MG (50000 UNIT) CAPS capsule [Pharmacy Med Name: VITAMIN D2 50,000IU (ERGO) CAP RX] 12 capsule 1    Sig: TAKE 1 CAPSULE BY MOUTH EVERY 7 DAYS      Endocrinology:  Vitamins - Vitamin D Supplementation Failed - 11/07/2019  6:34 AM      Failed - 50,000 IU strengths are not delegated      Failed - Phosphate in normal range and within 360 days    No results found for: PHOS        Failed - Vitamin D in normal range and within 360 days    Vit D, 25-Hydroxy  Date Value Ref Range Status  12/14/2018 10.3 (L) 30.0 - 100.0 ng/mL Final    Comment:    Vitamin D deficiency has been defined by the Institute of Medicine and an Endocrine Society practice guideline as a level of serum 25-OH vitamin D less than 20 ng/mL (1,2). The Endocrine Society went on to further define vitamin D insufficiency as a level between 21 and 29 ng/mL (2). 1. IOM (Institute of Medicine). 2010. Dietary reference    intakes for calcium and D. [place]: The    [employer]. 2. Holick MF, Binkley McComb, Bischoff-Ferrari HA, et al.    Evaluation, treatment, and prevention of vitamin D    deficiency: an Endocrine Society clinical practice    guideline. JCEM. 2011 Jul; 96(7):1911-30.           Passed - Ca in normal range and within 360 days    Calcium  Date Value Ref Range Status  12/14/2018 9.1 8.7 - 10.2 mg/dL Final   Calcium, Total  Date Value Ref Range Status  04/09/2013 8.9 8.5 - 10.1 mg/dL Final          Passed - Valid encounter within last 12 months    Recent Outpatient Visits           5 months ago Seizure [facility])   [facility] Alba Cory,  MD   8 months ago Well adult exam   [facility] Alba Cory, MD   11 months ago Seizure [facility])   [facility] Alba Cory, MD   1 year ago Benign hypertension   [facility] Alba Cory, MD   1 year ago Elevated lipase   [facility] Alba Cory, MD       Future Appointments             In 2 weeks Alba Cory, MD [facility]

## 2019-11-22 ENCOUNTER — Other Ambulatory Visit: Payer: Self-pay

## 2019-11-22 ENCOUNTER — Encounter: Payer: Self-pay | Admitting: Family Medicine

## 2019-11-22 ENCOUNTER — Ambulatory Visit: Payer: 59 | Admitting: Family Medicine

## 2019-11-22 VITALS — BP 136/76 | HR 98 | Temp 97.9°F | Resp 16 | Ht 64.0 in | Wt 312.4 lb

## 2019-11-22 DIAGNOSIS — I10 Essential (primary) hypertension: Secondary | ICD-10-CM

## 2019-11-22 DIAGNOSIS — E538 Deficiency of other specified B group vitamins: Secondary | ICD-10-CM

## 2019-11-22 DIAGNOSIS — E7800 Pure hypercholesterolemia, unspecified: Secondary | ICD-10-CM | POA: Diagnosis not present

## 2019-11-22 DIAGNOSIS — D509 Iron deficiency anemia, unspecified: Secondary | ICD-10-CM

## 2019-11-22 DIAGNOSIS — E559 Vitamin D deficiency, unspecified: Secondary | ICD-10-CM

## 2019-11-22 DIAGNOSIS — Z23 Encounter for immunization: Secondary | ICD-10-CM

## 2019-11-22 DIAGNOSIS — R569 Unspecified convulsions: Secondary | ICD-10-CM | POA: Diagnosis not present

## 2019-11-22 DIAGNOSIS — G4733 Obstructive sleep apnea (adult) (pediatric): Secondary | ICD-10-CM

## 2019-11-22 DIAGNOSIS — R7303 Prediabetes: Secondary | ICD-10-CM

## 2019-11-22 DIAGNOSIS — K219 Gastro-esophageal reflux disease without esophagitis: Secondary | ICD-10-CM

## 2019-11-22 DIAGNOSIS — J4520 Mild intermittent asthma, uncomplicated: Secondary | ICD-10-CM

## 2019-11-22 MED ORDER — AMLODIPINE BESYLATE 5 MG PO TABS: 5.0000 mg | ORAL_TABLET | Freq: Every day | ORAL | 1 refills | Status: DC

## 2019-11-22 MED ORDER — TELMISARTAN-HCTZ 80-12.5 MG PO TABS: 1.0000 | ORAL_TABLET | Freq: Every day | ORAL | 1 refills | Status: DC

## 2019-11-22 NOTE — Progress Notes (Signed)
Name: Paula [name]   MRN: [id number]    DOB: [birth year]   Date:11/22/2019       Progress Note  Subjective  Chief Complaint  Follow up   HPI   HTN: she is compliant with her bp medication, no chest pain, SOB, palipitation  Hyperlipidemia:  Her heart attacks strokes risk is low and we will continue to monitor for now  The 10-year ASCVD risk score Denman George DC Montez Hageman., et al., 2013) is: 5%   Values used to calculate the score:     Age: 59 years     Sex: Female     Is Non-Hispanic African American: Yes     Diabetic: No     Tobacco smoker: No     Systolic Blood Pressure: 136 mmHg     Is BP treated: Yes     HDL Cholesterol: 95 mg/dL     Total Cholesterol: 228 mg/dL  Seizure disorder: [facility] neurologist,last visit October 2020, his lamotrigine was high but she has been on the same dose . No episodes of seizure in years and is able to drive  She is compliant with medication   Asthma mild: she states currently no problems, she states symptoms are worse in the Spring and Fall. She has noticed a dry cough again, advised to resume Symbicort , and it is working well for her.She does not have SOB or wheezing.   OSA: mild, she used to wear CPAP, lost weight and stopped, but even though she has gained weight she does not want to resume CPAP machine yet. She denies morning headaches, she wakes up with dry mouth. Explained she needs to resume CPAP machine  Morbid Obesity: her weight has gone up, she has gained another 7  lbs since last visit , she states the stress of COVID-19  Is not helping, she is a stress eater. She states not really trying to lose weight at this time.   Patient Active Problem List   Diagnosis Date Noted  . 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 Pre-diabetes 01/01/2017  . 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 Plantar fasciitis, right 06/17/2016  . Osteoarthritis of right foot 06/17/2016  . Primary osteoarthritis, right ankle and foot 09/13/2015  . Carpal tunnel  syndrome, right 09/13/2015  . Tarsal tunnel syndrome of right side 09/13/2015  . 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 Asthma, mild intermittent 08/15/2014  . Benign hypertension 08/15/2014  . Herpes genitalis in women 08/15/2014  . Allergic rhinitis 08/15/2014  . Cerebral seizure (HCC) 08/15/2014  . Vitamin D deficiency 08/15/2014  . Morbid obesity (HCC) 08/03/2014  . Adhesive capsulitis of left shoulder 06/16/2013  . Obstructive sleep apnea of adult 06/16/2013  . Epilepsy (HCC) 06/16/2013  . Anemia, iron deficiency 09/30/2006    Past Surgical History:  Procedure Laterality Date  . BREAST EXCISIONAL BIOPSY Left    age 21 tumor removed  . BREAST SURGERY Left    benign lump at age 55    Family History  Problem Relation Age of Onset  . Cancer Mother        Breast  . Hypertension Mother   . Diabetes Mother   . Hyperlipidemia Mother   . Breast cancer Mother     Social History   Tobacco Use  . Smoking status: Never Smoker  . Smokeless tobacco: Never Used  Substance Use Topics  . Alcohol use: No    Alcohol/week: 0.0 standard drinks  Current Outpatient Medications:  .  albuterol (PROVENTIL HFA;VENTOLIN HFA) 108 (90 Base) MCG/ACT inhaler, Inhale 2 puffs every 6 (six) hours as needed into the lungs., Disp: 1 Inhaler, Rfl: 0 .  amLODipine (NORVASC) 5 MG tablet, Take 1 tablet (5 mg total) by mouth daily., Disp: 90 tablet, Rfl: 1 .  aspirin EC 81 MG tablet, Take 1 tablet (81 mg total) by mouth daily., Disp: 30 tablet, Rfl: 0 .  budesonide-formoterol (SYMBICORT) 160-4.5 MCG/ACT inhaler, Inhale 2 puffs into the lungs 2 (two) times daily., Disp: 1 Inhaler, Rfl: 3 .  carbamazepine (TEGRETOL) 200 MG tablet, TAKE 1 AND 1/2 TABLETS BY  MOUTH IN THE MORNING AND  NOON AND 2 AND 1/2 TABLETS  BY MOUTH AT NIGHT, Disp: , Rfl:  .  cetirizine (ZYRTEC) 10 MG tablet, Take 1 tablet (10 mg total) by mouth daily. (Patient taking differently: Take  10 mg by mouth as needed. ), Disp: 30 tablet, Rfl: 1 .  fluticasone (FLONASE) 50 MCG/ACT nasal spray, Place 2 sprays into both nostrils daily., Disp: 48 g, Rfl: 1 .  levETIRAcetam (KEPPRA) 500 MG tablet, Take 3 tablets by mouth 2 (two) times daily., Disp: , Rfl:  .  omeprazole (PRILOSEC) 20 MG capsule, TAKE 1 CAPSULE(20 MG) BY MOUTH DAILY, Disp: 90 capsule, Rfl: 3 .  PREVIDENT 5000 BOOSTER PLUS 1.1 % PSTE, , Disp: , Rfl:  .  telmisartan-hydrochlorothiazide (MICARDIS HCT) 80-12.5 MG tablet, TAKE 1 TABLET BY MOUTH DAILY, Disp: 90 tablet, Rfl: 1 .  topiramate (TOPAMAX) 100 MG tablet, Take 3 tablets by mouth 3 (three) times daily., Disp: , Rfl:  .  valACYclovir (VALTREX) 1000 MG tablet, Take 1 tablet (1,000 mg total) by mouth 2 (two) times daily., Disp: 20 tablet, Rfl: 0 .  Vitamin D, Ergocalciferol, (DRISDOL) 1.25 MG (50000 UNIT) CAPS capsule, TAKE 1 CAPSULE BY MOUTH EVERY 7 DAYS, Disp: 12 capsule, Rfl: 1  Allergies  Allergen Reactions  . Ace Inhibitors     I personally reviewed active problem list, medication list, allergies, family history, social history, health maintenance with the patient/caregiver today.   ROS  Constitutional: Negative for fever, positive for  weight change.  Respiratory: Negative for cough and shortness of breath.   Cardiovascular: Negative for chest pain or palpitations.  Gastrointestinal: Negative for abdominal pain, no bowel changes.  Musculoskeletal: Negative for gait problem or joint swelling.  Skin: Negative for rash.  Neurological: Negative for dizziness or headache.  No other specific complaints in a complete review of systems (except as listed in HPI above).  Objective  Vitals:   11/22/19 1516  BP: 136/76  Pulse: 98  Resp: 16  Temp: 97.9 F (36.6 C)  TempSrc: Oral  SpO2: 96%  Weight: (!) 312 lb 6.4 oz (141.7 kg)  Height: 5\' 4"  (1.626 m)    Body mass index is 53.62 kg/m.  Physical Exam  Constitutional: Patient appears well-developed and  well-nourished. Obese  No distress.  HEENT: head atraumatic, normocephalic, pupils equal and reactive to light,  neck supple Cardiovascular: Normal rate, regular rhythm and normal heart sounds.  No murmur heard. No BLE edema. Pulmonary/Chest: Effort normal and breath sounds normal. No respiratory distress. Abdominal: Soft.  There is no tenderness. Psychiatric: Patient has a normal mood and affect. behavior is normal. Judgment and thought content normal.   PHQ2/9: Depression screen [facility] 2/9 11/22/2019 02/22/2019 12/03/2018 07/07/2018 01/27/2018  Decreased Interest 0 0 0 0 0  Down, Depressed, Hopeless 1 0 0 0 0  PHQ - 2 Score  1 0 0 0 0  Altered sleeping - 0 0 0 -  Tired, decreased energy - 0 0 0 -  Change in appetite - 0 0 0 -  Feeling bad or failure about yourself  - 0 0 0 -  Trouble concentrating - 0 0 0 -  Moving slowly or fidgety/restless - 0 0 0 -  Suicidal thoughts - 0 0 0 -  PHQ-9 Score - 0 0 0 -  Difficult doing work/chores - Not difficult at all Not difficult at all - -    phq 9 is negative   Fall Risk: Fall Risk  11/22/2019 05/17/2019 02/22/2019 12/03/2018 07/07/2018  Falls in the past year? 0 0 0 0 0  Number falls in past yr: 0 0 0 0 0  Injury with Fall? 0 0 0 0 0  Comment - - - - -  Follow up - - - - -      Assessment & Plan  1. Seizure [facility])  Keep follow up with neurologist  - Comprehensive metabolic panel - Carbamazepine Level (Tegretol), total - Levetiracetam level  2. Morbid obesity (HCC)  Discussed with the patient the risk posed by an increased BMI. Discussed importance of portion control, calorie counting and at least 150 minutes of physical activity weekly. Avoid sweet beverages and drink more water. Eat at least 6 servings of fruit and vegetables daily   3. Obstructive sleep apnea of adult   4. Pure hypercholesterolemia  - Lipid panel  5. Vitamin D deficiency  - VITAMIN D 25 Hydroxy (Vit-D Deficiency, Fractures)  6. Benign hypertension  - CBC  with Differential/Platelet - telmisartan-hydrochlorothiazide (MICARDIS HCT) 80-12.5 MG tablet; Take 1 tablet by mouth daily.  Dispense: 90 tablet; Refill: 1 - amLODipine (NORVASC) 5 MG tablet; Take 1 tablet (5 mg total) by mouth daily.  Dispense: 90 tablet; Refill: 1  7. Pre-diabetes  - Hemoglobin A1c  8. Iron deficiency anemia, unspecified iron deficiency anemia type  - CBC with Differential/Platelet - Iron, TIBC and Ferritin Panel  9. Mild intermittent asthma without complication   10. B12 deficiency  - Hemoglobin A1c - Vitamin B12  11. GERD without esophagitis

## 2019-12-02 LAB — LIPID PANEL
Chol/HDL Ratio: 2.7 ratio (ref 0.0–4.4)
Cholesterol, Total: 236 mg/dL — ABNORMAL HIGH (ref 100–199)
HDL: 86 mg/dL (ref >39)
LDL Chol Calc (NIH): 137 mg/dL — ABNORMAL HIGH (ref 0–99)
Triglycerides: 76 mg/dL (ref 0–149)
VLDL Cholesterol Cal: 13 mg/dL (ref 5–40)

## 2019-12-02 LAB — COMPREHENSIVE METABOLIC PANEL
ALT: 8 IU/L (ref 0–32)
AST: 11 IU/L (ref 0–40)
Albumin/Globulin Ratio: 1.3 (ref 1.2–2.2)
Albumin: 4 g/dL (ref 3.8–4.9)
Alkaline Phosphatase: 143 IU/L — ABNORMAL HIGH (ref 44–121)
BUN/Creatinine Ratio: 14 (ref 9–23)
BUN: 12 mg/dL (ref 6–24)
Bilirubin Total: <0.2 mg/dL (ref 0.0–1.2)
CO2: 22 mmol/L (ref 20–29)
Calcium: 9.3 mg/dL (ref 8.7–10.2)
Chloride: 103 mmol/L (ref 96–106)
Creatinine, Ser: 0.83 mg/dL (ref 0.57–1.00)
GFR calc Af Amer: 91 mL/min/{1.73_m2} (ref >59)
GFR calc non Af Amer: 79 mL/min/{1.73_m2} (ref >59)
Globulin, Total: 3.2 g/dL (ref 1.5–4.5)
Glucose: 91 mg/dL (ref 65–99)
Potassium: 4 mmol/L (ref 3.5–5.2)
Sodium: 139 mmol/L (ref 134–144)
Total Protein: 7.2 g/dL (ref 6.0–8.5)

## 2019-12-02 LAB — CBC WITH DIFFERENTIAL/PLATELET
Basophils Absolute: 0 10*3/uL (ref 0.0–0.2)
Basos: 1 %
EOS (ABSOLUTE): 0 10*3/uL (ref 0.0–0.4)
Eos: 1 %
Hematocrit: 32.7 % — ABNORMAL LOW (ref 34.0–46.6)
Hemoglobin: 10.3 g/dL — ABNORMAL LOW (ref 11.1–15.9)
Immature Grans (Abs): 0 10*3/uL (ref 0.0–0.1)
Immature Granulocytes: 0 %
Lymphocytes Absolute: 0.9 10*3/uL (ref 0.7–3.1)
Lymphs: 30 %
MCH: 26.4 pg — ABNORMAL LOW (ref 26.6–33.0)
MCHC: 31.5 g/dL (ref 31.5–35.7)
MCV: 84 fL (ref 79–97)
Monocytes Absolute: 0.2 10*3/uL (ref 0.1–0.9)
Monocytes: 7 %
Neutrophils Absolute: 1.8 10*3/uL (ref 1.4–7.0)
Neutrophils: 61 %
Platelets: 156 10*3/uL (ref 150–450)
RBC: 3.9 x10E6/uL (ref 3.77–5.28)
RDW: 13.4 % (ref 11.7–15.4)
WBC: 3 10*3/uL — ABNORMAL LOW (ref 3.4–10.8)

## 2019-12-02 LAB — IRON,TIBC AND FERRITIN PANEL
Ferritin: 137 ng/mL (ref 15–150)
Iron Saturation: 25 % (ref 15–55)
Iron: 49 ug/dL (ref 27–159)
Total Iron Binding Capacity: 198 ug/dL — ABNORMAL LOW (ref 250–450)
UIBC: 149 ug/dL (ref 131–425)

## 2019-12-02 LAB — VITAMIN B12: Vitamin B-12: 343 pg/mL (ref 232–1245)

## 2019-12-02 LAB — CARBAMAZEPINE LEVEL, TOTAL: Carbamazepine (Tegretol), S: 12.6 ug/mL (ref 4.0–12.0)

## 2019-12-02 LAB — VITAMIN D 25 HYDROXY (VIT D DEFICIENCY, FRACTURES): Vit D, 25-Hydroxy: 16.8 ng/mL — ABNORMAL LOW (ref 30.0–100.0)

## 2019-12-02 LAB — HEMOGLOBIN A1C
Est. average glucose Bld gHb Est-mCnc: 120 mg/dL
Hgb A1c MFr Bld: 5.8 % — ABNORMAL HIGH (ref 4.8–5.6)

## 2019-12-02 LAB — LEVETIRACETAM LEVEL: Levetiracetam Lvl: 24.9 ug/mL (ref 10.0–40.0)

## 2020-01-24 ENCOUNTER — Other Ambulatory Visit: Payer: Self-pay | Admitting: Family Medicine

## 2020-01-24 DIAGNOSIS — I10 Essential (primary) hypertension: Secondary | ICD-10-CM

## 2020-01-31 ENCOUNTER — Other Ambulatory Visit: Payer: Self-pay

## 2020-01-31 ENCOUNTER — Telehealth: Payer: Self-pay

## 2020-02-02 MED ORDER — TOPIRAMATE 100 MG PO TABS
300.0000 mg | ORAL_TABLET | Freq: Three times a day (TID) | ORAL | 0 refills | Status: DC
Start: 2020-02-02 — End: 2021-05-21

## 2020-02-07 NOTE — Telephone Encounter (Signed)
resolved 

## 2020-03-15 ENCOUNTER — Telehealth: Payer: Self-pay

## 2020-03-15 NOTE — Telephone Encounter (Signed)
Copied from CRM [id number]. Topic: General - Other >> Mar 15, 2020  8:57 AM Jaquita Rector A wrote: Reason for CRM: Patient called back to say that she had missed call on her phone from the office but no VM was left. Asking if someone need to get her please leave a detailed message speak loud and clear on her VM or if its very important call her work number on file. Works from 10 AM to 7 PM.    Ph# [phone]

## 2020-04-05 ENCOUNTER — Other Ambulatory Visit: Payer: Self-pay | Admitting: Family Medicine

## 2020-04-05 DIAGNOSIS — I10 Essential (primary) hypertension: Secondary | ICD-10-CM

## 2020-04-19 ENCOUNTER — Other Ambulatory Visit: Payer: Self-pay | Admitting: Family Medicine

## 2020-04-19 DIAGNOSIS — Z1231 Encounter for screening mammogram for malignant neoplasm of breast: Secondary | ICD-10-CM

## 2020-05-05 ENCOUNTER — Other Ambulatory Visit: Payer: Self-pay | Admitting: Family Medicine

## 2020-05-05 DIAGNOSIS — E559 Vitamin D deficiency, unspecified: Secondary | ICD-10-CM

## 2020-05-05 NOTE — Telephone Encounter (Signed)
Requested medications are due for refill today yes  Requested medications are on the active medication list yes  Last refill 11/27  Last visit 11/2019  Future visit scheduled 05/2020  Notes to clinic Not Delegated

## 2020-05-16 NOTE — Progress Notes (Signed)
Name: Paula CombesCynthia L [name]   MRN: [id number]    DOB: Jan 03, 1961   Date:05/17/2020       Progress Note  Subjective  Chief Complaint  Follow Up  HPI  HTN: she is compliant with her bp medication, no chest pain, SOB, palipitation, bp has been in the 130's range   Hyperlipidemia:  Her heart attacks strokes risk is low and we will continue to monitor for now  The 10-year ASCVD risk score Paula George(Goff DC Montez HagemanJr., et al., 2013) is: 5.8%   Values used to calculate the score:     Age: [age] years     Sex: Female     Is Non-Hispanic African American: Yes     Diabetic: No     Tobacco smoker: No     Systolic Blood Pressure: 138 mmHg     Is BP treated: Yes     HDL Cholesterol: 86 mg/dL     Total Cholesterol: 236 mg/dL  [facility] - Dr. Alessandra BevelsVaughn , states was seen this year,  Taking  Lamotrigine and topamax and last levels were normal. tet recommended her to resume CPAP and if not able to use the old CPAP she will go back for another sleep study. No episodes of seizure in years and is able to drive    Asthma mild: she states currently no problems, she states symptoms are worse in the Spring and Fall. She states this week that she is getting a dry cough again, she also has nasal congestion, no wheezing or SOB. She has not been using Symbicort   OSA: mild, she used to wear CPAP, lost weight and stopped, but even though she has gained weight . She was advised by neurologist to resume   Morbid Obesity: her weight had been stable, down a few pounds since last visit six months ago. She is trying to eat healthier.   Leucopenia/Anemia: seen by Dr. Orlie DakinFinnegan and advised to monitor and if drops again to go back for more iron infusion, she has not been taking iron supplementation. She used to have heavy periods , however no cycles after removal of last IUD. S/p menopause  Left plantar fascitis: [facility] had [facility] and was given an ankle brace, she was also given pain  medication but she did not take it. Pain is better today but still high , she is taking naproxen but never started hydrocodone, she states she is feeling better   Patient Active Problem List   Diagnosis Date Noted  . 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 Pre-diabetes 01/01/2017  . 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 Plantar fasciitis, right 06/17/2016  . Osteoarthritis of right foot 06/17/2016  . Primary osteoarthritis, right ankle and foot 09/13/2015  . Carpal tunnel syndrome, right 09/13/2015  . Tarsal tunnel syndrome of right side 09/13/2015  . 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 Asthma, mild intermittent 08/15/2014  . Benign hypertension 08/15/2014  . Herpes genitalis in women 08/15/2014  . Allergic rhinitis 08/15/2014  . Cerebral seizure (HCC) 08/15/2014  . Vitamin D deficiency 08/15/2014  . Morbid obesity (HCC) 08/03/2014  . Adhesive capsulitis of left shoulder 06/16/2013  . Obstructive sleep apnea of adult 06/16/2013  . Epilepsy (HCC) 06/16/2013  . Anemia, iron deficiency 09/30/2006    Past Surgical History:  Procedure Laterality Date  . BREAST EXCISIONAL BIOPSY Left    age 60 tumor removed  .  BREAST SURGERY Left    benign lump at age 60    Family History  Problem Relation Age of Onset  . Cancer Mother        Breast  . Hypertension Mother   . Diabetes Mother   . Hyperlipidemia Mother   . Breast cancer Mother     Social History   Tobacco Use  . Smoking status: Never Smoker  . Smokeless tobacco: Never Used  Substance Use Topics  . Alcohol use: No    Alcohol/week: 0.0 standard drinks     Current Outpatient Medications:  .  albuterol (PROVENTIL HFA;VENTOLIN HFA) 108 (90 Base) MCG/ACT inhaler, Inhale 2 puffs every 6 (six) hours as needed into the lungs., Disp: 1 Inhaler, Rfl: 0 .  amLODipine (NORVASC) 5 MG tablet, TAKE 1 TABLET(5 MG) BY MOUTH DAILY, Disp: 90 tablet, Rfl: 0 .  aspirin EC 81  MG tablet, Take 1 tablet (81 mg total) by mouth daily., Disp: 30 tablet, Rfl: 0 .  budesonide-formoterol (SYMBICORT) 160-4.5 MCG/ACT inhaler, Inhale 2 puffs into the lungs 2 (two) times daily., Disp: 1 Inhaler, Rfl: 3 .  carbamazepine (TEGRETOL) 200 MG tablet, TAKE 1 AND 1/2 TABLETS BY  MOUTH IN THE MORNING AND  NOON AND 2 AND 1/2 TABLETS  BY MOUTH AT NIGHT, Disp: , Rfl:  .  cetirizine (ZYRTEC) 10 MG tablet, Take 1 tablet (10 mg total) by mouth daily. (Patient taking differently: Take 10 mg by mouth as needed.), Disp: 30 tablet, Rfl: 1 .  fluticasone (FLONASE) 50 MCG/ACT nasal spray, Place 2 sprays into both nostrils daily., Disp: 48 g, Rfl: 1 .  HYDROcodone-acetaminophen (NORCO/VICODIN) 5-325 MG tablet, Take one tablet at night for pain; may take up to every 6 hours as needed for pain if not working or driving, Disp: , Rfl:  .  levETIRAcetam (KEPPRA) 500 MG tablet, Take 3 tablets by mouth 2 (two) times daily., Disp: , Rfl:  .  naproxen (NAPROSYN) 500 MG tablet, Take 500 mg by mouth 2 (two) times daily., Disp: , Rfl:  .  omeprazole (PRILOSEC) 20 MG capsule, TAKE 1 CAPSULE(20 MG) BY MOUTH DAILY, Disp: 90 capsule, Rfl: 3 .  PREVIDENT 5000 BOOSTER PLUS 1.1 % PSTE, , Disp: , Rfl:  .  telmisartan-hydrochlorothiazide (MICARDIS HCT) 80-12.5 MG tablet, TAKE 1 TABLET BY MOUTH DAILY, Disp: 90 tablet, Rfl: 1 .  topiramate (TOPAMAX) 100 MG tablet, Take 3 tablets (300 mg total) by mouth 3 (three) times daily., Disp: 60 tablet, Rfl: 0 .  valACYclovir (VALTREX) 1000 MG tablet, Take 1 tablet (1,000 mg total) by mouth 2 (two) times daily., Disp: 20 tablet, Rfl: 0 .  Vitamin D, Ergocalciferol, (DRISDOL) 1.25 MG (50000 UNIT) CAPS capsule, TAKE 1 CAPSULE BY MOUTH EVERY 7 DAYS, Disp: 12 capsule, Rfl: 1  Allergies  Allergen Reactions  . Ace Inhibitors     I personally reviewed active problem list, medication list, allergies, family history, social history, health maintenance with the patient/caregiver  today.   ROS  Constitutional: Negative for fever or weight change.  Respiratory: positive  for cough and stable  shortness of breath.   Cardiovascular: Negative for chest pain or palpitations.  Gastrointestinal: Negative for abdominal pain, no bowel changes.  Musculoskeletal: positive for gait problem and right joint swelling.  Skin: Negative for rash.  Neurological: Negative for dizziness or headache.  No other specific complaints in a complete review of systems (except as listed in HPI above).   Objective  Vitals:   05/17/20 1502  BP:  138/72  Pulse: 99  Resp: 18  Temp: 98.1 F (36.7 C)  TempSrc: Oral  SpO2: 99%  Weight: (!) 309 lb 4.8 oz (140.3 kg)  Height: 5\' 4"  (1.626 m)    Body mass index is 53.09 kg/m.  Physical Exam  Constitutional: Patient appears well-developed and well-nourished. Obese  No distress.  HEENT: head atraumatic, normocephalic, pupils equal and reactive to light,  neck supple Cardiovascular: Normal rate, regular rhythm and normal heart sounds.  No murmur heard. No BLE edema. Pulmonary/Chest: Effort normal and breath sounds normal. No respiratory distress. Abdominal: Soft.  There is no tenderness. Muscular skeletal: using a cane a brace on right ankle  Psychiatric: Patient has a normal mood and affect. behavior is normal. Judgment and thought content normal.  PHQ2/9: Depression screen [facility] 2/9 05/17/2020 11/22/2019 02/22/2019 12/03/2018 07/07/2018  Decreased Interest 0 0 0 0 0  Down, Depressed, Hopeless 0 1 0 0 0  PHQ - 2 Score 0 1 0 0 0  Altered sleeping - - 0 0 0  Tired, decreased energy - - 0 0 0  Change in appetite - - 0 0 0  Feeling bad or failure about yourself  - - 0 0 0  Trouble concentrating - - 0 0 0  Moving slowly or fidgety/restless - - 0 0 0  Suicidal thoughts - - 0 0 0  PHQ-9 Score - - 0 0 0  Difficult doing work/chores - - Not difficult at all Not difficult at all -    phq 9 is negative   Fall Risk: Fall Risk  05/17/2020  11/22/2019 05/17/2019 02/22/2019 12/03/2018  Falls in the past year? 0 0 0 0 0  Number falls in past yr: 0 0 0 0 0  Injury with Fall? 0 0 0 0 0  Comment - - - - -  Follow up - - - - -    Functional Status Survey: Is the patient deaf or have difficulty hearing?: No Does the patient have difficulty seeing, even when wearing glasses/contacts?: No Does the patient have difficulty concentrating, remembering, or making decisions?: No Does the patient have difficulty walking or climbing stairs?: Yes Does the patient have difficulty dressing or bathing?: No Does the patient have difficulty doing errands alone such as visiting a doctor's office or shopping?: No    Assessment & Plan  1. Benign hypertension  - amLODipine (NORVASC) 5 MG tablet; Take 1 tablet (5 mg total) by mouth daily.  Dispense: 90 tablet; Refill: 1 - telmisartan-hydrochlorothiazide (MICARDIS HCT) 80-12.5 MG tablet; Take 1 tablet by mouth daily.  Dispense: 90 tablet; Refill: 1  2. Mild intermittent asthma without complication  - albuterol (VENTOLIN HFA) 108 (90 Base) MCG/ACT inhaler; Inhale 2 puffs into the lungs every 6 (six) hours as needed.  Dispense: 1 each; Refill: 0 - budesonide-formoterol (SYMBICORT) 160-4.5 MCG/ACT inhaler; Inhale 2 puffs into the lungs 2 (two) times daily.  Dispense: 1 each; Refill: 2  3. Oral herpes simplex infection  - valACYclovir (VALTREX) 1000 MG tablet; Take 1 tablet (1,000 mg total) by mouth 2 (two) times daily.  Dispense: 20 tablet; Refill: 0  4. Pre-diabetes   5. Seizure (HCC)  Stable   6. Morbid obesity (HCC)  Discussed with the patient the risk posed by an increased BMI. Discussed importance of portion control, calorie counting and at least 150 minutes of physical activity weekly. Avoid sweet beverages and drink more water. Eat at least 6 servings of fruit and vegetables daily   7. Obstructive  sleep apnea of adult   8. Pure hypercholesterolemia  Discussed life style  modification   9. Vitamin D deficiency  Continue vitamin D supplementation   10. Pain of right heel  - naproxen (NAPROSYN) 500 MG tablet; Take 500 mg by mouth 2 (two) times daily. - Ambulatory referral to Podiatry

## 2020-05-17 ENCOUNTER — Ambulatory Visit: Payer: 59 | Admitting: Family Medicine

## 2020-05-17 ENCOUNTER — Other Ambulatory Visit: Payer: Self-pay

## 2020-05-17 ENCOUNTER — Encounter: Payer: Self-pay | Admitting: Family Medicine

## 2020-05-17 VITALS — BP 138/72 | HR 99 | Temp 98.1°F | Resp 18 | Ht 64.0 in | Wt 309.3 lb

## 2020-05-17 DIAGNOSIS — I10 Essential (primary) hypertension: Secondary | ICD-10-CM

## 2020-05-17 DIAGNOSIS — R569 Unspecified convulsions: Secondary | ICD-10-CM | POA: Diagnosis not present

## 2020-05-17 DIAGNOSIS — J4520 Mild intermittent asthma, uncomplicated: Secondary | ICD-10-CM | POA: Diagnosis not present

## 2020-05-17 DIAGNOSIS — E7800 Pure hypercholesterolemia, unspecified: Secondary | ICD-10-CM

## 2020-05-17 DIAGNOSIS — G4733 Obstructive sleep apnea (adult) (pediatric): Secondary | ICD-10-CM

## 2020-05-17 DIAGNOSIS — M79671 Pain in right foot: Secondary | ICD-10-CM

## 2020-05-17 DIAGNOSIS — B002 Herpesviral gingivostomatitis and pharyngotonsillitis: Secondary | ICD-10-CM | POA: Diagnosis not present

## 2020-05-17 DIAGNOSIS — R7303 Prediabetes: Secondary | ICD-10-CM

## 2020-05-17 DIAGNOSIS — E559 Vitamin D deficiency, unspecified: Secondary | ICD-10-CM

## 2020-05-17 MED ORDER — AMLODIPINE BESYLATE 5 MG PO TABS: 5.0000 mg | ORAL_TABLET | Freq: Every day | ORAL | 1 refills | Status: DC

## 2020-05-17 MED ORDER — VALACYCLOVIR HCL 1 G PO TABS: 1000.0000 mg | ORAL_TABLET | Freq: Two times a day (BID) | ORAL | 0 refills | Status: DC

## 2020-05-17 MED ORDER — BUDESONIDE-FORMOTEROL FUMARATE 160-4.5 MCG/ACT IN AERO: 2.0000 | INHALATION_SPRAY | Freq: Two times a day (BID) | RESPIRATORY_TRACT | 2 refills | Status: DC

## 2020-05-17 MED ORDER — ALBUTEROL SULFATE HFA 108 (90 BASE) MCG/ACT IN AERS: 2.0000 | INHALATION_SPRAY | Freq: Four times a day (QID) | RESPIRATORY_TRACT | 0 refills | Status: DC | PRN

## 2020-05-17 MED ORDER — TELMISARTAN-HCTZ 80-12.5 MG PO TABS: 1.0000 | ORAL_TABLET | Freq: Every day | ORAL | 1 refills | Status: DC

## 2020-05-29 ENCOUNTER — Ambulatory Visit (INDEPENDENT_AMBULATORY_CARE_PROVIDER_SITE_OTHER): Payer: 59 | Admitting: Podiatry

## 2020-05-29 ENCOUNTER — Encounter: Payer: Self-pay | Admitting: Podiatry

## 2020-05-29 ENCOUNTER — Other Ambulatory Visit: Payer: Self-pay

## 2020-05-29 DIAGNOSIS — L602 Onychogryphosis: Secondary | ICD-10-CM

## 2020-05-29 DIAGNOSIS — M7661 Achilles tendinitis, right leg: Secondary | ICD-10-CM

## 2020-05-29 MED ORDER — NAPROXEN 500 MG PO TABS: 500.0000 mg | ORAL_TABLET | Freq: Two times a day (BID) | ORAL | 1 refills | Status: AC

## 2020-05-29 NOTE — Progress Notes (Signed)
  Subjective:  Patient ID: Paula [name], female    DOB: [birth year],  MRN: [id number]  Chief Complaint  Patient presents with  . Foot Pain    Patient presents today for right heel pain    60 y.o. female presents with the above complaint. History confirmed with patient.  Had an x-ray taken at urgent care with [facility], was told that she has large heel spur  Objective:  Physical Exam: warm, good capillary refill, no trophic changes or ulcerative lesions, normal DP and PT pulses and normal sensory exam.  Dystrophic toenails with elongated thickened nails that she cannot cut.  Pain on palpation sharply to the insertion of the Achilles tendon on the lateral posterior heel Assessment:   1. Achilles tendinitis, right leg   2. Long toenail      Plan:  Patient was evaluated and treated and all questions answered.  Discussed the etiology and treatment options for Achilles tendinitis including stretching, formal physical therapy, supportive shoegears such as a running shoe or sneaker, pre fabricated versus custom orthoses,  and oral medications. We also discussed the role of surgical treatment of this for patients who do not improve after exhausting non-surgical treatment options.    -Educated on stretching and icing of the affected limb.  -Refilled her prescription of Naprosyn 500 mg twice daily -We discussed physical therapy, she would prefer not to do this currently because of her job restrictions.  We will consider strongly if she is not better by next visit   She is unable to cut her own toenails and I trimmed these her request.  We discussed this may be a surface that is not covered for her.   No follow-ups on file.

## 2020-05-29 NOTE — Patient Instructions (Signed)
Look for Vionic shoes  Achilles Tendinitis  with Rehab Achilles tendinitis is a disorder of the Achilles tendon. The Achilles tendon connects the large calf muscles (Gastrocnemius and Soleus) to the heel bone (calcaneus). This tendon is sometimes called the heel cord. It is important for pushing-off and standing on your toes and is important for walking, running, or jumping. Tendinitis is often caused by overuse and repetitive microtrauma. SYMPTOMS  Pain, tenderness, swelling, warmth, and redness may occur over the Achilles tendon even at rest.  Pain with pushing off, or flexing or extending the ankle.  Pain that is worsened after or during activity. CAUSES   Overuse sometimes seen with rapid increase in exercise programs or in sports requiring running and jumping.  Poor physical conditioning (strength and flexibility or endurance).  Running sports, especially training running down hills.  Inadequate warm-up before practice or play or failure to stretch before participation.  Injury to the tendon. PREVENTION   Warm up and stretch before practice or competition.  Allow time for adequate rest and recovery between practices and competition.  Keep up conditioning.  Keep up ankle and leg flexibility.  Improve or keep muscle strength and endurance.  Improve cardiovascular fitness.  Use proper technique.  Use proper equipment (shoes, skates).  To help prevent recurrence, taping, protective strapping, or an adhesive bandage may be recommended for several weeks after healing is complete. PROGNOSIS   Recovery may take weeks to several months to heal.  Longer recovery is expected if symptoms have been prolonged.  Recovery is usually quicker if the inflammation is due to a direct blow as compared with overuse or sudden strain. RELATED COMPLICATIONS   Healing time will be prolonged if the condition is not correctly treated. The injury must be given plenty of time to  heal.  Symptoms can reoccur if activity is resumed too soon.  Untreated, tendinitis may increase the risk of tendon rupture requiring additional time for recovery and possibly surgery. TREATMENT   The first treatment consists of rest anti-inflammatory medication, and ice to relieve the pain.  Stretching and strengthening exercises after resolution of pain will likely help reduce the risk of recurrence. Referral to a physical therapist or athletic trainer for further evaluation and treatment may be helpful.  A walking boot or cast may be recommended to rest the Achilles tendon. This can help break the cycle of inflammation and microtrauma.  Arch supports (orthotics) may be prescribed or recommended by your caregiver as an adjunct to therapy and rest.  Surgery to remove the inflamed tendon lining or degenerated tendon tissue is rarely necessary and has shown less than predictable results. MEDICATION   Nonsteroidal anti-inflammatory medications, such as aspirin and ibuprofen, may be used for pain and inflammation relief. Do not take within 7 days before surgery. Take these as directed by your caregiver. Contact your caregiver immediately if any bleeding, stomach upset, or signs of allergic reaction occur. Other minor pain relievers, such as acetaminophen, may also be used.  Pain relievers may be prescribed as necessary by your caregiver. Do not take prescription pain medication for longer than 4 to 7 days. Use only as directed and only as much as you need.  Cortisone injections are rarely indicated. Cortisone injections may weaken tendons and predispose to rupture. It is better to give the condition more time to heal than to use them. HEAT AND COLD  Cold is used to relieve pain and reduce inflammation for acute and chronic Achilles tendinitis. Cold should be applied  for 10 to 15 minutes every 2 to 3 hours for inflammation and pain and immediately after any activity that aggravates your  symptoms. Use ice packs or an ice massage.  Heat may be used before performing stretching and strengthening activities prescribed by your caregiver. Use a heat pack or a warm soak. SEEK MEDICAL CARE IF:  Symptoms get worse or do not improve in 2 weeks despite treatment.  New, unexplained symptoms develop. Drugs used in treatment may produce side effects.  EXERCISES:  RANGE OF MOTION (ROM) AND STRETCHING EXERCISES - Achilles Tendinitis  These exercises may help you when beginning to rehabilitate your injury. Your symptoms may resolve with or without further involvement from your physician, physical therapist or athletic trainer. While completing these exercises, remember:   Restoring tissue flexibility helps normal motion to return to the joints. This allows healthier, less painful movement and activity.  An effective stretch should be held for at least 30 seconds.  A stretch should never be painful. You should only feel a gentle lengthening or release in the stretched tissue.  STRETCH  Gastroc, Standing   Place hands on wall.  Extend right / left leg, keeping the front knee somewhat bent.  Slightly point your toes inward on your back foot.  Keeping your right / left heel on the floor and your knee straight, shift your weight toward the wall, not allowing your back to arch.  You should feel a gentle stretch in the right / left calf. Hold this position for 10 seconds. Repeat 3 times. Complete this stretch 2 times per day.  STRETCH  Soleus, Standing   Place hands on wall.  Extend right / left leg, keeping the other knee somewhat bent.  Slightly point your toes inward on your back foot.  Keep your right / left heel on the floor, bend your back knee, and slightly shift your weight over the back leg so that you feel a gentle stretch deep in your back calf.  Hold this position for 10 seconds. Repeat 3 times. Complete this stretch 2 times per day.  STRETCH  Gastrocsoleus,  Standing  Note: This exercise can place a lot of stress on your foot and ankle. Please complete this exercise only if specifically instructed by your caregiver.   Place the ball of your right / left foot on a step, keeping your other foot firmly on the same step.  Hold on to the wall or a rail for balance.  Slowly lift your other foot, allowing your body weight to press your heel down over the edge of the step.  You should feel a stretch in your right / left calf.  Hold this position for 10 seconds.  Repeat this exercise with a slight bend in your knee. Repeat 3 times. Complete this stretch 2 times per day.   STRENGTHENING EXERCISES - Achilles Tendinitis These exercises may help you when beginning to rehabilitate your injury. They may resolve your symptoms with or without further involvement from your physician, physical therapist or athletic trainer. While completing these exercises, remember:   Muscles can gain both the endurance and the strength needed for everyday activities through controlled exercises.  Complete these exercises as instructed by your physician, physical therapist or athletic trainer. Progress the resistance and repetitions only as guided.  You may experience muscle soreness or fatigue, but the pain or discomfort you are trying to eliminate should never worsen during these exercises. If this pain does worsen, stop and make certain you are  following the directions exactly. If the pain is still present after adjustments, discontinue the exercise until you can discuss the trouble with your clinician.  STRENGTH - Plantar-flexors   Sit with your right / left leg extended. Holding onto both ends of a rubber exercise band/tubing, loop it around the ball of your foot. Keep a slight tension in the band.  Slowly push your toes away from you, pointing them downward.  Hold this position for 10 seconds. Return slowly, controlling the tension in the band/tubing. Repeat 3 times.  Complete this exercise 2 times per day.   STRENGTH - Plantar-flexors   Stand with your feet shoulder width apart. Steady yourself with a wall or table using as little support as needed.  Keeping your weight evenly spread over the width of your feet, rise up on your toes.*  Hold this position for 10 seconds. Repeat 3 times. Complete this exercise 2 times per day.  *If this is too easy, shift your weight toward your right / left leg until you feel challenged. Ultimately, you may be asked to do this exercise with your right / left foot only.  STRENGTH  Plantar-flexors, Eccentric  Note: This exercise can place a lot of stress on your foot and ankle. Please complete this exercise only if specifically instructed by your caregiver.   Place the balls of your feet on a step. With your hands, use only enough support from a wall or rail to keep your balance.  Keep your knees straight and rise up on your toes.  Slowly shift your weight entirely to your right / left toes and pick up your opposite foot. Gently and with controlled movement, lower your weight through your right / left foot so that your heel drops below the level of the step. You will feel a slight stretch in the back of your calf at the end position.  Use the healthy leg to help rise up onto the balls of both feet, then lower weight only on the right / left leg again. Build up to 15 repetitions. Then progress to 3 consecutive sets of 15 repetitions.*  After completing the above exercise, complete the same exercise with a slight knee bend (about 30 degrees). Again, build up to 15 repetitions. Then progress to 3 consecutive sets of 15 repetitions.* Perform this exercise 2 times per day.  *When you easily complete 3 sets of 15, your physician, physical therapist or athletic trainer may advise you to add resistance by wearing a backpack filled with additional weight.  STRENGTH - Plantar Flexors, Seated   Sit on a chair that allows your feet  to rest flat on the ground. If necessary, sit at the edge of the chair.  Keeping your toes firmly on the ground, lift your right / left heel as far as you can without increasing any discomfort in your ankle. Repeat 3 times. Complete this exercise 2 times a day.

## 2020-07-05 ENCOUNTER — Encounter: Payer: Self-pay | Admitting: Family Medicine

## 2020-07-05 ENCOUNTER — Telehealth (INDEPENDENT_AMBULATORY_CARE_PROVIDER_SITE_OTHER): Payer: 59 | Admitting: Family Medicine

## 2020-07-05 DIAGNOSIS — J069 Acute upper respiratory infection, unspecified: Secondary | ICD-10-CM

## 2020-07-05 DIAGNOSIS — R059 Cough, unspecified: Secondary | ICD-10-CM

## 2020-07-05 DIAGNOSIS — J4520 Mild intermittent asthma, uncomplicated: Secondary | ICD-10-CM

## 2020-07-05 MED ORDER — AZELASTINE HCL 0.1 % NA SOLN: 2.0000 | Freq: Two times a day (BID) | NASAL | 0 refills | Status: DC

## 2020-07-05 MED ORDER — BENZONATATE 100 MG PO CAPS: 100.0000 mg | ORAL_CAPSULE | Freq: Two times a day (BID) | ORAL | 0 refills | Status: DC | PRN

## 2020-07-05 NOTE — Progress Notes (Signed)
Name: Paula [name]   MRN: [id number]    DOB: 11-26-1960   Date:07/05/2020       Progress Note  Subjective  Chief Complaint  Chief Complaint  Patient presents with  . Cough  . Sore Throat  . Sinusitis    I connected with  Jacqualin Combes on 07/05/20 at 10:40 AM EDT by telephone and verified that I am speaking with the correct person using two identifiers.   I discussed the limitations, risks, security and privacy concerns of performing an evaluation and management service by telephone and the availability of in person appointments. Staff also discussed with the patient that there may be a patient responsible charge related to this service. Patient Location: at home  Provider Location: [facility] Additional Individuals present: alone  HPI  URI: symptoms started a few days ago, initially sore throat followed by nasal congestion, now has a dry cough that is worse at night and also woke up with eyes matted shut. No change in appetite, no fever or chills. Feeling tired. She has asthma and has noticed increase in cough but denies wheezing or SOB. 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 She has been taking loratadine.    She had two COVID-19 but not the booster   Patient Active Problem List   Diagnosis Date Noted  . 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 Pre-diabetes 01/01/2017  . 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 Plantar fasciitis, right 06/17/2016  . Osteoarthritis of right foot 06/17/2016  . Primary osteoarthritis, right ankle and foot 09/13/2015  . Carpal tunnel syndrome, right 09/13/2015  . Tarsal tunnel syndrome of right side 09/13/2015  . 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 Asthma, mild intermittent 08/15/2014  . Benign hypertension 08/15/2014  . Herpes genitalis in women 08/15/2014  . Allergic rhinitis 08/15/2014  . Cerebral seizure (HCC)  08/15/2014  . Vitamin D deficiency 08/15/2014  . Morbid obesity (HCC) 08/03/2014  . Adhesive capsulitis of left shoulder 06/16/2013  . Obstructive sleep apnea of adult 06/16/2013  . Epilepsy (HCC) 06/16/2013  . Anemia, iron deficiency 09/30/2006    Social History   Tobacco Use  . Smoking status: Never Smoker  . Smokeless tobacco: Never Used  Substance Use Topics  . Alcohol use: No    Alcohol/week: 0.0 standard drinks     Current Outpatient Medications:  .  albuterol (VENTOLIN HFA) 108 (90 Base) MCG/ACT inhaler, Inhale 2 puffs into the lungs every 6 (six) hours as needed., Disp: 1 each, Rfl: 0 .  amLODipine (NORVASC) 5 MG tablet, Take 1 tablet (5 mg total) by mouth daily., Disp: 90 tablet, Rfl: 1 .  aspirin EC 81 MG tablet, Take 1 tablet (81 mg total) by mouth daily., Disp: 30 tablet, Rfl: 0 .  budesonide-formoterol (SYMBICORT) 160-4.5 MCG/ACT inhaler, Inhale 2 puffs into the lungs 2 (two) times daily., Disp: 1 each, Rfl: 2 .  carbamazepine (TEGRETOL) 200 MG tablet, TAKE 1 AND 1/2 TABLETS BY  MOUTH IN THE MORNING AND  NOON AND 2 AND 1/2 TABLETS  BY MOUTH AT NIGHT, Disp: , Rfl:  .  cetirizine (ZYRTEC) 10 MG tablet, Take 1 tablet (10 mg total) by mouth daily. (Patient taking differently: Take 10 mg by mouth as needed.), Disp: 30 tablet, Rfl: 1 .  fluticasone (FLONASE) 50 MCG/ACT nasal spray, Place 2 sprays into both nostrils daily.,  Disp: 48 g, Rfl: 1 .  levETIRAcetam (KEPPRA) 500 MG tablet, Take 3 tablets by mouth 2 (two) times daily., Disp: , Rfl:  .  omeprazole (PRILOSEC) 20 MG capsule, TAKE 1 CAPSULE(20 MG) BY MOUTH DAILY, Disp: 90 capsule, Rfl: 3 .  PREVIDENT 5000 BOOSTER PLUS 1.1 % PSTE, , Disp: , Rfl:  .  telmisartan-hydrochlorothiazide (MICARDIS HCT) 80-12.5 MG tablet, Take 1 tablet by mouth daily., Disp: 90 tablet, Rfl: 1 .  topiramate (TOPAMAX) 100 MG tablet, Take 3 tablets (300 mg total) by mouth 3 (three) times daily., Disp: 60 tablet, Rfl: 0 .  valACYclovir (VALTREX) 1000  MG tablet, Take 1 tablet (1,000 mg total) by mouth 2 (two) times daily., Disp: 20 tablet, Rfl: 0 .  Vitamin D, Ergocalciferol, (DRISDOL) 1.25 MG (50000 UNIT) CAPS capsule, TAKE 1 CAPSULE BY MOUTH EVERY 7 DAYS, Disp: 12 capsule, Rfl: 1  Allergies  Allergen Reactions  . Ace Inhibitors     I personally reviewed active problem list, medication list, allergies, family history, social history with the patient/caregiver today.  ROS  Ten systems reviewed and is negative except as mentioned in HPI   Objective  Virtual encounter, vitals not obtained.  There is no height or weight on file to calculate BMI.  Nursing Note and Vital Signs reviewed.  Physical Exam  Awake, alert and oriented  Assessment & Plan  1. Upper respiratory tract infection, unspecified type  - azelastine (ASTELIN) 0.1 % nasal spray; Place 2 sprays into both nostrils 2 (two) times daily. Use in each nostril as directed  Dispense: 30 mL; Refill: 0 - Novel Coronavirus, NAA ([facility])  2. Cough  - benzonatate (TESSALON) 100 MG capsule; Take 1 capsule (100 mg total) by mouth 2 (two) times daily as needed for cough.  Dispense: 40 capsule; Refill: 0 Explained we will avoid medication that causes respiratory suppression until COVID-19 test is back, if negative I can send Tussionex   3. Mild intermittent asthma without complication  Reminded her to use Symbicort daily   -Red flags and when to present for emergency care or RTC including fever >101.59F, chest pain, shortness of breath, new/worsening/un-resolving symptoms,  reviewed with patient at time of visit. Follow up and care instructions discussed and provided in AVS. - I discussed the assessment and treatment plan with the patient. The patient was provided an opportunity to ask questions and all were answered. The patient agreed with the plan and demonstrated an understanding of the instructions.  - The patient was advised to call back or seek an in-person evaluation if  the symptoms worsen or if the condition fails to improve as anticipated.  I provided 15 minutes of non-face-to-face time during this encounter.  Ruel Favors, MD

## 2020-07-07 ENCOUNTER — Telehealth: Payer: Self-pay | Admitting: Family Medicine

## 2020-07-07 ENCOUNTER — Other Ambulatory Visit: Payer: Self-pay

## 2020-07-07 DIAGNOSIS — J3089 Other allergic rhinitis: Secondary | ICD-10-CM

## 2020-07-07 LAB — SARS-COV-2, NAA 2 DAY TAT

## 2020-07-07 LAB — SPECIMEN STATUS REPORT

## 2020-07-07 LAB — NOVEL CORONAVIRUS, NAA: SARS-CoV-2, NAA: NOT DETECTED

## 2020-07-07 MED ORDER — FLUTICASONE PROPIONATE 50 MCG/ACT NA SUSP: 2.0000 | Freq: Every day | NASAL | 1 refills | Status: DC

## 2020-07-07 NOTE — Telephone Encounter (Signed)
Pt is calling to report that she would like some cough syrup instead of pills of benzonatate (TESSALON) 100 MG capsule [[id number]] . Pt also reports that azelastine (ASTELIN) 0.1 % nasal spray [[id number]] burns her nose. Pt states that she was previously prescribed a nasal spray that did burn can she go back to that. Preferred Pharmacy-[id number] - [place], [place] - [address] AT [facility] [address] [facility] [address]  [address] [place], [place] [address]

## 2020-07-10 ENCOUNTER — Encounter: Payer: Self-pay | Admitting: Podiatry

## 2020-07-10 ENCOUNTER — Ambulatory Visit (INDEPENDENT_AMBULATORY_CARE_PROVIDER_SITE_OTHER): Payer: 59 | Admitting: Podiatry

## 2020-07-10 ENCOUNTER — Other Ambulatory Visit: Payer: Self-pay | Admitting: Family Medicine

## 2020-07-10 ENCOUNTER — Telehealth: Payer: Self-pay

## 2020-07-10 ENCOUNTER — Other Ambulatory Visit: Payer: Self-pay

## 2020-07-10 DIAGNOSIS — M7661 Achilles tendinitis, right leg: Secondary | ICD-10-CM

## 2020-07-10 NOTE — Telephone Encounter (Signed)
Pt informed of negative covid test. Pt stated that you called her in some pills to help with the cough but she prefer the syrup.

## 2020-07-10 NOTE — Progress Notes (Signed)
  Subjective:  Patient ID: Paula [name], female    DOB: 1960/08/06,  MRN: [id number]  Chief Complaint  Patient presents with  . Tendonitis    "its better but the good feet inserts are not working"    60 y.o. female returns with the above complaint. History confirmed with patient.  H overall she has had quite a bit of improvement.  The inserts are not comfortable and make her walk toward the outside and she has a squeaking noise when she wears them  Objective:  Physical Exam: warm, good capillary refill, no trophic changes or ulcerative lesions, normal DP and PT pulses and normal sensory exam.  Minimal to no pain today 5 out of 5 strength I did watch her ambulate and she does invert the heel quite a bit in the inserts are audible Assessment:   1. Achilles tendinitis, right leg      Plan:  Patient was evaluated and treated and all questions answered.  Discussed the etiology and treatment options for Achilles tendinitis including stretching, formal physical therapy, supportive shoegears such as a running shoe or sneaker, pre fabricated versus custom orthoses,  and oral medications. We also discussed the role of surgical treatment of this for patients who do not improve after exhausting non-surgical treatment options.    -Educated on stretching and icing of the affected limb.  Continue this until she is pain-free -Continue Naprosyn 500 mg twice daily as needed -Advised her to try to wear the cushion portion of the insert without the hard plastic arch support.  This should keep her from inverting excessively.  If not improving will consider custom molded orthotics in the future  Return in about 2 months (around 09/09/2020).

## 2020-07-11 ENCOUNTER — Other Ambulatory Visit: Payer: Self-pay | Admitting: Family Medicine

## 2020-07-11 MED ORDER — HYDROCOD POLST-CPM POLST ER 10-8 MG/5ML PO SUER: 5.0000 mL | Freq: Two times a day (BID) | ORAL | 0 refills | Status: DC | PRN

## 2020-07-17 ENCOUNTER — Ambulatory Visit: Payer: 59 | Admitting: Family Medicine

## 2020-07-21 ENCOUNTER — Other Ambulatory Visit: Payer: Self-pay | Admitting: Family Medicine

## 2020-07-21 DIAGNOSIS — I10 Essential (primary) hypertension: Secondary | ICD-10-CM

## 2020-09-18 ENCOUNTER — Ambulatory Visit: Payer: 59 | Admitting: Podiatry

## 2020-11-20 ENCOUNTER — Encounter: Payer: Self-pay | Admitting: Family Medicine

## 2020-11-20 ENCOUNTER — Ambulatory Visit (INDEPENDENT_AMBULATORY_CARE_PROVIDER_SITE_OTHER): Payer: 59 | Admitting: Family Medicine

## 2020-11-20 ENCOUNTER — Other Ambulatory Visit: Payer: Self-pay

## 2020-11-20 DIAGNOSIS — Z20822 Contact with and (suspected) exposure to covid-19: Secondary | ICD-10-CM

## 2020-11-20 DIAGNOSIS — I10 Essential (primary) hypertension: Secondary | ICD-10-CM

## 2020-11-20 DIAGNOSIS — D509 Iron deficiency anemia, unspecified: Secondary | ICD-10-CM

## 2020-11-20 DIAGNOSIS — E538 Deficiency of other specified B group vitamins: Secondary | ICD-10-CM

## 2020-11-20 DIAGNOSIS — Z0000 Encounter for general adult medical examination without abnormal findings: Secondary | ICD-10-CM

## 2020-11-20 DIAGNOSIS — R569 Unspecified convulsions: Secondary | ICD-10-CM | POA: Diagnosis not present

## 2020-11-20 DIAGNOSIS — K219 Gastro-esophageal reflux disease without esophagitis: Secondary | ICD-10-CM

## 2020-11-20 DIAGNOSIS — G4733 Obstructive sleep apnea (adult) (pediatric): Secondary | ICD-10-CM

## 2020-11-20 DIAGNOSIS — Z1231 Encounter for screening mammogram for malignant neoplasm of breast: Secondary | ICD-10-CM | POA: Diagnosis not present

## 2020-11-20 DIAGNOSIS — Z23 Encounter for immunization: Secondary | ICD-10-CM

## 2020-11-20 DIAGNOSIS — E559 Vitamin D deficiency, unspecified: Secondary | ICD-10-CM

## 2020-11-20 DIAGNOSIS — Z113 Encounter for screening for infections with a predominantly sexual mode of transmission: Secondary | ICD-10-CM | POA: Diagnosis not present

## 2020-11-20 DIAGNOSIS — Z124 Encounter for screening for malignant neoplasm of cervix: Secondary | ICD-10-CM | POA: Diagnosis not present

## 2020-11-20 DIAGNOSIS — E7800 Pure hypercholesterolemia, unspecified: Secondary | ICD-10-CM

## 2020-11-20 DIAGNOSIS — R7303 Prediabetes: Secondary | ICD-10-CM

## 2020-11-20 DIAGNOSIS — J4520 Mild intermittent asthma, uncomplicated: Secondary | ICD-10-CM

## 2020-11-20 DIAGNOSIS — B002 Herpesviral gingivostomatitis and pharyngotonsillitis: Secondary | ICD-10-CM

## 2020-11-20 MED ORDER — TELMISARTAN-HCTZ 80-12.5 MG PO TABS: 1.0000 | ORAL_TABLET | Freq: Every day | ORAL | 1 refills | Status: DC

## 2020-11-20 MED ORDER — VITAMIN D (ERGOCALCIFEROL) 1.25 MG (50000 UNIT) PO CAPS: 50000.0000 [IU] | ORAL_CAPSULE | ORAL | 1 refills | Status: DC

## 2020-11-20 MED ORDER — VALACYCLOVIR HCL 1 G PO TABS: 1000.0000 mg | ORAL_TABLET | Freq: Two times a day (BID) | ORAL | 0 refills | Status: DC

## 2020-11-20 MED ORDER — AMLODIPINE BESYLATE 5 MG PO TABS: 5.0000 mg | ORAL_TABLET | Freq: Every day | ORAL | 1 refills | Status: DC

## 2020-11-20 NOTE — Progress Notes (Signed)
Name: Paula [name]   MRN: [id number]    DOB: 1961-01-23   Date:11/20/2020       Progress Note  Subjective  Chief Complaint  Chief Complaint  Patient presents with   Annual Exam    HPI  Patient presents for annual CPE and follow up  HTN: she is compliant with her bp medication, no dizziness  chest pain or plpitation   Hyperlipidemia:  Her heart attacks strokes risk is low and we will continue to monitor for now, recheck levels  The 10-year ASCVD risk score (Arnett DK, et al., 2019) is: 6.1%   Values used to calculate the score:     Age: 60 years     Sex: Female     Is Non-Hispanic African American: Yes     Diabetic: No     Tobacco smoker: No     Systolic Blood Pressure: 409 mmHg     Is BP treated: Yes     HDL Cholesterol: 86 mg/dL     Total Cholesterol: 236 mg/dL    Seizure disorder: [facility] neurologist, last visit 04/2020  no recent seizure, able to drive, last levels normal. We will recheck today. Taking medication as prescirbed.    Asthma mild: she states currently no problems, she states she has been using Symbicort prn only, reminded her that it expires after 60 days once canister is opened. Occasional dry cough but no wheezing or SOB   OSA: mild, she used to wear CPAP, lost weight and stopped, but even though she has gained weight she does not want to resume CPAP machine yet. She denies morning headaches, she wakes up with dry mouth.Neurologist saw her in 04/2020 , she is supposed to repeat sleep study    Morbid Obesity: her weight has gone up, weigh was up 20 lbs but losing again, today is down 6 lbs , hopefully she can break 300 lbs soon. Discussed healthy diet and portion control     Diet: discussed importance of portion control  Exercise: discussed 150 minutes per week    [place] Office Visit from 11/20/2020 in [facility]  AUDIT-C Score 0      Depression: Phq 9 is  negative Depression screen [facility] 2/9 11/20/2020 07/05/2020 05/17/2020  11/22/2019 02/22/2019  Decreased Interest 0 0 0 0 0  Down, Depressed, Hopeless 0 0 0 1 0  PHQ - 2 Score 0 0 0 1 0  Altered sleeping - - - - 0  Tired, decreased energy - - - - 0  Change in appetite - - - - 0  Feeling bad or failure about yourself  - - - - 0  Trouble concentrating - - - - 0  Moving slowly or fidgety/restless - - - - 0  Suicidal thoughts - - - - 0  PHQ-9 Score - - - - 0  Difficult doing work/chores - - - - Not difficult at all   Hypertension: BP Readings from Last 3 Encounters:  11/20/20 136/74  05/17/20 138/72  11/22/19 136/76   Obesity: Wt Readings from Last 3 Encounters:  11/20/20 (!) 302 lb (137 kg)  05/17/20 (!) 309 lb 4.8 oz (140.3 kg)  11/22/19 (!) 312 lb 6.4 oz (141.7 kg)   BMI Readings from Last 3 Encounters:  11/20/20 51.84 kg/m  05/17/20 53.09 kg/m  11/22/19 53.62 kg/m     Vaccines:   Shingrix: she wants to return in October for that  Pneumonia: educated and discussed with patient. Flu: she will  return for flu clinic in October   Hep C Screening: 09/18/15 STD testing and prevention (HIV/chl/gon/syphilis): we ordered labs today  Intimate partner violence: negative Sexual History : not sexually active, but has a domestic partner  Menstrual History/LMP/Abnormal Bleeding: discussed post-menopausal bleeding  Incontinence Symptoms: she has to wear a pad, due to urgency , discussed importance of avoiding caffeine intake, only using bathroom two time daily, discussed importance of trying to go more often   Breast cancer:  - Last Mammogram: ordered 04/19/20, reminded her to schedule  - BRCA gene screening: N/A  Osteoporosis: Discussed high calcium and vitamin D supplementation, weight bearing exercises  Cervical cancer screening: refused, she wants to repeat next year   Skin cancer: Discussed monitoring for atypical lesions  Colorectal cancer: 05/11/13   Lung cancer: Low Dose CT Chest recommended if Age 66-80 years, 20 pack-year currently smoking OR  have quit w/in 15years. Patient does not qualify.   ECG: 04/25/18  Advanced Care Planning: A voluntary discussion about advance care planning including the explanation and discussion of advance directives.  Discussed health care proxy and Living will, and the patient was able to identify a health care proxy as  Lipids: Lab Results  Component Value Date   CHOL 236 (H) 11/29/2019   CHOL 228 (H) 12/14/2018   CHOL 194 07/07/2017   Lab Results  Component Value Date   HDL 86 11/29/2019   HDL 95 12/14/2018   HDL 83 07/07/2017   Lab Results  Component Value Date   LDLCALC 137 (H) 11/29/2019   LDLCALC 122 (H) 12/14/2018   LDLCALC 95 07/07/2017   Lab Results  Component Value Date   TRIG 76 11/29/2019   TRIG 62 12/14/2018   TRIG 82 07/07/2017   Lab Results  Component Value Date   CHOLHDL 2.7 11/29/2019   CHOLHDL 2.4 12/14/2018   CHOLHDL 2.3 07/07/2017   No results found for: LDLDIRECT  Glucose: Glucose  Date Value Ref Range Status  11/29/2019 91 65 - 99 mg/dL Final  12/14/2018 89 65 - 99 mg/dL Final  05/08/2018 88 65 - 99 mg/dL Final  04/09/2013 97 65 - 99 mg/dL Final  05/14/2011 93 65 - 99 mg/dL Final   Glucose, Bld  Date Value Ref Range Status  04/25/2018 108 (H) 70 - 99 mg/dL Final  04/17/2018 99 70 - 99 mg/dL Final    Patient Active Problem List   Diagnosis Date Noted   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   Pre-diabetes 01/01/2017   Sinus tarsi syndrome of right foot 06/17/2016   Plantar fasciitis, right 06/17/2016   Osteoarthritis of right foot 06/17/2016   Primary osteoarthritis, right ankle and foot 09/13/2015   Carpal tunnel syndrome, right 09/13/2015   Tarsal tunnel syndrome of right side 09/13/2015   Varicose veins of both lower extremities without ulcer or inflammation 09/23/2014   Asthma, mild intermittent 08/15/2014   Benign hypertension 08/15/2014   Herpes genitalis in women 08/15/2014   Allergic rhinitis 08/15/2014   Cerebral seizure ([place])  08/15/2014   Vitamin D deficiency 08/15/2014   Morbid obesity ([place]) 08/03/2014   Adhesive capsulitis of left shoulder 06/16/2013   Obstructive sleep apnea of adult 06/16/2013   Epilepsy ([place]) 06/16/2013   Anemia, iron deficiency 09/30/2006    Past Surgical History:  Procedure Laterality Date   BREAST EXCISIONAL BIOPSY Left    age 16 tumor removed   BREAST SURGERY Left    benign lump at age 54    Family History  Problem  Relation Age of Onset   Cancer Mother        Breast   Hypertension Mother    Diabetes Mother    Hyperlipidemia Mother    Breast cancer Mother     Social History   Socioeconomic History   Marital status: Soil scientist    Spouse name: Erbin    Number of children: 0   Years of education: Not on file   Highest education level: Associate degree: occupational, Hotel manager, or vocational program  Occupational History   Occupation: lab specialist    Employer: [employer]  Tobacco Use   Smoking status: Never   Smokeless tobacco: Never  Vaping Use   Vaping Use: Never used  Substance and Sexual Activity   Alcohol use: No    Alcohol/week: 0.0 standard drinks   Drug use: No   Sexual activity: Not Currently  Other Topics Concern   Not on file  Social History Narrative   Lives with  boyfriend   Never had children    Works for [employer] of Radio broadcast assistant Strain: Low Risk    Difficulty of Paying Living Expenses: Not hard at all  Food Insecurity: No Food Insecurity   Worried About Charity fundraiser in the Last Year: Never true   Arboriculturist in the Last Year: Never true  Transportation Needs: No Transportation Needs   Lack of Transportation (Medical): No   Lack of Transportation (Non-Medical): No  Physical Activity: Inactive   Days of Exercise per Week: 0 days   Minutes of Exercise per Session: 0 min  Stress: No Stress Concern Present   Feeling of Stress : Only a little  Social Connections: Moderately Isolated    Frequency of Communication with Friends and Family: More than three times a week   Frequency of Social Gatherings with Friends and Family: Three times a week   Attends Religious Services: 1 to 4 times per year   Active Member of Clubs or Organizations: No   Attends Archivist Meetings: Never   Marital Status: Never married  Human resources officer Violence: Not At Risk   Fear of Current or Ex-Partner: No   Emotionally Abused: No   Physically Abused: No   Sexually Abused: No     Current Outpatient Medications:    albuterol (VENTOLIN HFA) 108 (90 Base) MCG/ACT inhaler, Inhale 2 puffs into the lungs every 6 (six) hours as needed., Disp: 1 each, Rfl: 0   budesonide-formoterol (SYMBICORT) 160-4.5 MCG/ACT inhaler, Inhale 2 puffs into the lungs 2 (two) times daily., Disp: 1 each, Rfl: 2   carbamazepine (TEGRETOL) 200 MG tablet, TAKE 1 AND 1/2 TABLETS BY  MOUTH IN THE MORNING AND  NOON AND 2 AND 1/2 TABLETS  BY MOUTH AT NIGHT, Disp: , Rfl:    cetirizine (ZYRTEC) 10 MG tablet, Take 1 tablet (10 mg total) by mouth daily. (Patient taking differently: Take 10 mg by mouth as needed.), Disp: 30 tablet, Rfl: 1   fluticasone (FLONASE) 50 MCG/ACT nasal spray, Place 2 sprays into both nostrils daily., Disp: 48 g, Rfl: 1   levETIRAcetam (KEPPRA) 500 MG tablet, Take 3 tablets by mouth 2 (two) times daily., Disp: , Rfl:    PREVIDENT 5000 BOOSTER PLUS 1.1 % PSTE, , Disp: , Rfl:    topiramate (TOPAMAX) 100 MG tablet, Take 3 tablets (300 mg total) by mouth 3 (three) times daily., Disp: 60 tablet, Rfl: 0   amLODipine (NORVASC) 5 MG tablet,  Take 1 tablet (5 mg total) by mouth daily., Disp: 90 tablet, Rfl: 1   telmisartan-hydrochlorothiazide (MICARDIS HCT) 80-12.5 MG tablet, Take 1 tablet by mouth daily., Disp: 90 tablet, Rfl: 1   valACYclovir (VALTREX) 1000 MG tablet, Take 1 tablet (1,000 mg total) by mouth 2 (two) times daily., Disp: 20 tablet, Rfl: 0   Vitamin D, Ergocalciferol, (DRISDOL) 1.25 MG (50000 UNIT)  CAPS capsule, Take 1 capsule (50,000 Units total) by mouth every 7 (seven) days., Disp: 12 capsule, Rfl: 1  Allergies  Allergen Reactions   Ace Inhibitors      ROS  Constitutional: Negative for fever, positive for mild  weight change.  Respiratory: positive  for intermittent  cough but no shortness of breath.   Cardiovascular: Negative for chest pain or palpitations.  Gastrointestinal: Negative for abdominal pain, no bowel changes.  Musculoskeletal: Negative for gait problem or joint swelling.  Skin: Negative for rash.  Neurological: Negative for dizziness or headache.  No other specific complaints in a complete review of systems (except as listed in HPI above).   Objective  Vitals:   11/20/20 1511  BP: 136/74  Pulse: 92  Resp: 16  Temp: 98.9 F (37.2 C)  SpO2: 97%  Weight: (!) 302 lb (137 kg)  Height: _0  (1.626 m)    Body mass index is 51.84 kg/m.  Physical Exam  Constitutional: Patient appears well-developed and well-nourished.Obese  No distress.  HENT: Head: Normocephalic and atraumatic. Ears: B TMs ok, no erythema or effusion; Nose: Nose normal. Mouth/Throat: Oropharynx is clear and moist. No oropharyngeal exudate.  Eyes: Conjunctivae and EOM are normal. Pupils are equal, round, and reactive to light. No scleral icterus.  Neck: Normal range of motion. Neck supple. No JVD present. No thyromegaly present.  Cardiovascular: Normal rate, regular rhythm and normal heart sounds.  No murmur heard. Trace  BLE edema. Pulmonary/Chest: Effort normal and breath sounds normal. No respiratory distress. Abdominal: Soft. Bowel sounds are normal, no distension. There is no tenderness. no masses Breast: no lumps or masses, no nipple discharge or rashes FEMALE GENITALIA:  Not done  RECTAL: not done  Musculoskeletal: Normal range of motion, no joint effusions. No gross deformities Neurological: he is alert and oriented to person, place, and time. No cranial nerve deficit.  Coordination, balance, strength, speech and gait are normal.  Skin: Skin is warm and dry. No rash noted. No erythema.  Psychiatric: Patient has a normal mood and affect. behavior is normal. Judgment and thought content normal.   Fall Risk: Fall Risk  11/20/2020 07/05/2020 05/17/2020 11/22/2019 05/17/2019  Falls in the past year? 0 0 0 0 0  Number falls in past yr: 0 0 0 0 0  Injury with Fall? 0 0 0 0 0  Comment - - - - -  Risk for fall due to : No Fall Risks - - - -  Follow up Falls prevention discussed - - - -     Functional Status Survey: Is the patient deaf or have difficulty hearing?: No Does the patient have difficulty seeing, even when wearing glasses/contacts?: No Does the patient have difficulty concentrating, remembering, or making decisions?: No Does the patient have difficulty walking or climbing stairs?: No Does the patient have difficulty dressing or bathing?: No Does the patient have difficulty doing errands alone such as visiting a doctor's office or shopping?: No   Assessment & Plan  1. Morbid obesity ([place])  Discussed with the patient the risk posed by an increased BMI. Discussed importance  of portion control, calorie counting and at least 150 minutes of physical activity weekly. Avoid sweet beverages and drink more water. Eat at least 6 servings of fruit and vegetables daily    2. Well adult exam   3. Needs flu shot  She will return in one month   4. Need for shingles vaccine  She will return in one month   5. Seizure ([place])  - Carbamazepine Level (Tegretol), total - Levetiracetam level  6. Pre-diabetes  - Hemoglobin A1c  7. Mild intermittent asthma without complication   8. Oral herpes simplex infection  - valACYclovir (VALTREX) 1000 MG tablet; Take 1 tablet (1,000 mg total) by mouth 2 (two) times daily.  Dispense: 20 tablet; Refill: 0  9. B12 deficiency  - CBC with Differential/Platelet - Vitamin B12  10. Vitamin D deficiency  - VITAMIN D 25  Hydroxy (Vit-D Deficiency, Fractures) - Vitamin D, Ergocalciferol, (DRISDOL) 1.25 MG (50000 UNIT) CAPS capsule; Take 1 capsule (50,000 Units total) by mouth every 7 (seven) days.  Dispense: 12 capsule; Refill: 1  11. Obstructive sleep apnea of adult   12. Iron deficiency anemia, unspecified iron deficiency anemia type  - CBC with Differential/Platelet - Iron, TIBC and Ferritin Panel  13. GERD without esophagitis   14. Pure hypercholesterolemia  - Lipid panel  15. Benign hypertension  - Comprehensive metabolic panel - amLODipine (NORVASC) 5 MG tablet; Take 1 tablet (5 mg total) by mouth daily.  Dispense: 90 tablet; Refill: 1 - telmisartan-hydrochlorothiazide (MICARDIS HCT) 80-12.5 MG tablet; Take 1 tablet by mouth daily.  Dispense: 90 tablet; Refill: 1  16. Cervical cancer screening  Refused   17. Breast cancer screening by mammogram  - MM 3D SCREEN BREAST BILATERAL; Future  18. Routine screening for STI (sexually transmitted infection)  - RPR - HIV Antibody (routine testing w rflx)  19. Close exposure to COVID-19 virus  - Novel Coronavirus, NAA ([facility])   -USPSTF grade A and B recommendations reviewed with patient; age-appropriate recommendations, preventive care, screening tests, etc discussed and encouraged; healthy living encouraged; see AVS for patient education given to patient -Discussed importance of 150 minutes of physical activity weekly, eat two servings of fish weekly, eat one serving of tree nuts ( cashews, pistachios, pecans, almonds.Marland Kitchen) every other day, eat 6 servings of fruit/vegetables daily and drink plenty of water and avoid sweet beverages.

## 2020-11-20 NOTE — Patient Instructions (Signed)
Preventive Care 40-58 Years Old, Female Preventive care refers to lifestyle choices and visits with your health care provider that can promote health and wellness. This includes: A yearly physical exam. This is also called an annual wellness visit. Regular dental and eye exams. Immunizations. Screening for certain conditions. Healthy lifestyle choices, such as: Eating a healthy diet. Getting regular exercise. Not using drugs or products that contain nicotine and tobacco. Limiting alcohol use. What can I expect for my preventive care visit? Physical exam Your health care provider will check your: Height and weight. These may be used to calculate your BMI (body mass index). BMI is a measurement that tells if you are at a healthy weight. Heart rate and blood pressure. Body temperature. Skin for abnormal spots. Counseling Your health care provider may ask you questions about your: Past medical problems. Family's medical history. Alcohol, tobacco, and drug use. Emotional well-being. Home life and relationship well-being. Sexual activity. Diet, exercise, and sleep habits. Work and work environment. Access to firearms. Method of birth control. Menstrual cycle. Pregnancy history. What immunizations do I need? Vaccines are usually given at various ages, according to a schedule. Your health care provider will recommend vaccines for you based on your age, medical history, and lifestyle or other factors, such as travel or where you work. What tests do I need? Blood tests Lipid and cholesterol levels. These may be checked every 5 years, or more often if you are over 50 years old. Hepatitis C test. Hepatitis B test. Screening Lung cancer screening. You may have this screening every year starting at age 55 if you have a 30-pack-year history of smoking and currently smoke or have quit within the past 15 years. Colorectal cancer screening. All adults should have this screening starting at  age 50 and continuing until age 75. Your health care provider may recommend screening at age 45 if you are at increased risk. You will have tests every 1-10 years, depending on your results and the type of screening test. Diabetes screening. This is done by checking your blood sugar (glucose) after you have not eaten for a while (fasting). You may have this done every 1-3 years. Mammogram. This may be done every 1-2 years. Talk with your health care provider about when you should start having regular mammograms. This may depend on whether you have a family history of breast cancer. BRCA-related cancer screening. This may be done if you have a family history of breast, ovarian, tubal, or peritoneal cancers. Pelvic exam and Pap test. This may be done every 3 years starting at age 21. Starting at age 30, this may be done every 5 years if you have a Pap test in combination with an HPV test. Other tests STD (sexually transmitted disease) testing, if you are at risk. Bone density scan. This is done to screen for osteoporosis. You may have this scan if you are at high risk for osteoporosis. Talk with your health care provider about your test results, treatment options, and if necessary, the need for more tests. Follow these instructions at home: Eating and drinking  Eat a diet that includes fresh fruits and vegetables, whole grains, lean protein, and low-fat dairy products. Take vitamin and mineral supplements as recommended by your health care provider. Do not drink alcohol if: Your health care provider tells you not to drink. You are pregnant, may be pregnant, or are planning to become pregnant. If you drink alcohol: Limit how much you have to 0-1 drink a day. Be   aware of how much alcohol is in your drink. In the [place] one drink equals one 12 oz bottle of beer (355 mL), one 5 oz glass of wine (148 mL), or one 1 oz glass of hard liquor (44 mL). Lifestyle Take daily care of your teeth and  gums. Brush your teeth every morning and night with fluoride toothpaste. Floss one time each day. Stay active. Exercise for at least 30 minutes 5 or more days each week. Do not use any products that contain nicotine or tobacco, such as cigarettes, e-cigarettes, and chewing tobacco. If you need help quitting, ask your health care provider. Do not use drugs. If you are sexually active, practice safe sex. Use a condom or other form of protection to prevent STIs (sexually transmitted infections). If you do not wish to become pregnant, use a form of birth control. If you plan to become pregnant, see your health care provider for a prepregnancy visit. If told by your health care provider, take low-dose aspirin daily starting at age 63. Find healthy ways to cope with stress, such as: Meditation, yoga, or listening to music. Journaling. Talking to a trusted person. Spending time with friends and family. Safety Always wear your seat belt while driving or riding in a vehicle. Do not drive: If you have been drinking alcohol. Do not ride with someone who has been drinking. When you are tired or distracted. While texting. Wear a helmet and other protective equipment during sports activities. If you have firearms in your house, make sure you follow all gun safety procedures. What's next? Visit your health care provider once a year for an annual wellness visit. Ask your health care provider how often you should have your eyes and teeth checked. Stay up to date on all vaccines. This information is not intended to replace advice given to you by your health care provider. Make sure you discuss any questions you have with your health care provider. Document Revised: 05/05/2020 Document Reviewed: 11/06/2017 Elsevier Patient Education  2022 [employer].

## 2020-11-21 LAB — NOVEL CORONAVIRUS, NAA: SARS-CoV-2, NAA: NOT DETECTED

## 2020-11-21 LAB — SARS-COV-2, NAA 2 DAY TAT

## 2020-11-24 ENCOUNTER — Other Ambulatory Visit: Payer: Self-pay | Admitting: Family Medicine

## 2020-11-24 DIAGNOSIS — I10 Essential (primary) hypertension: Secondary | ICD-10-CM

## 2020-11-29 ENCOUNTER — Other Ambulatory Visit: Payer: Self-pay | Admitting: Family Medicine

## 2020-11-29 ENCOUNTER — Telehealth: Payer: Self-pay | Admitting: Family Medicine

## 2020-11-29 NOTE — Telephone Encounter (Signed)
Relayed Dr. Carlynn Purl note. Pt states she wants Vit D rx, and will take multivitamins and B12 SL as well. She will also try to exercise more and cut back on carbs.

## 2020-11-29 NOTE — Telephone Encounter (Signed)
Please call back if results are released. Patient called in upset because she was left a vm to call back and she says it was results, but nothing was found released.

## 2020-11-30 LAB — COMPREHENSIVE METABOLIC PANEL
ALT: 8 IU/L (ref 0–32)
AST: 9 IU/L (ref 0–40)
Albumin/Globulin Ratio: 1.4 (ref 1.2–2.2)
Albumin: 4.1 g/dL (ref 3.8–4.9)
Alkaline Phosphatase: 130 IU/L — ABNORMAL HIGH (ref 44–121)
BUN/Creatinine Ratio: 15 (ref 9–23)
BUN: 13 mg/dL (ref 6–24)
Bilirubin Total: 0.2 mg/dL (ref 0.0–1.2)
CO2: 22 mmol/L (ref 20–29)
Calcium: 9.1 mg/dL (ref 8.7–10.2)
Chloride: 104 mmol/L (ref 96–106)
Creatinine, Ser: 0.87 mg/dL (ref 0.57–1.00)
Globulin, Total: 2.9 g/dL (ref 1.5–4.5)
Glucose: 88 mg/dL (ref 65–99)
Potassium: 3.9 mmol/L (ref 3.5–5.2)
Sodium: 139 mmol/L (ref 134–144)
Total Protein: 7 g/dL (ref 6.0–8.5)
eGFR: 77 mL/min/{1.73_m2} (ref >59)

## 2020-11-30 LAB — HEMOGLOBIN A1C
Est. average glucose Bld gHb Est-mCnc: 126 mg/dL
Hgb A1c MFr Bld: 6 % — ABNORMAL HIGH (ref 4.8–5.6)

## 2020-11-30 LAB — IRON,TIBC AND FERRITIN PANEL
Ferritin: 99 ng/mL (ref 15–150)
Iron Saturation: 22 % (ref 15–55)
Iron: 44 ug/dL (ref 27–159)
Total Iron Binding Capacity: 199 ug/dL — ABNORMAL LOW (ref 250–450)
UIBC: 155 ug/dL (ref 131–425)

## 2020-11-30 LAB — CBC WITH DIFFERENTIAL/PLATELET
Basophils Absolute: 0 10*3/uL (ref 0.0–0.2)
Basos: 1 %
EOS (ABSOLUTE): 0.1 10*3/uL (ref 0.0–0.4)
Eos: 2 %
Hematocrit: 31.1 % — ABNORMAL LOW (ref 34.0–46.6)
Hemoglobin: 9.8 g/dL — ABNORMAL LOW (ref 11.1–15.9)
Immature Grans (Abs): 0 10*3/uL (ref 0.0–0.1)
Immature Granulocytes: 1 %
Lymphocytes Absolute: 0.9 10*3/uL (ref 0.7–3.1)
Lymphs: 30 %
MCH: 26.5 pg — ABNORMAL LOW (ref 26.6–33.0)
MCHC: 31.5 g/dL (ref 31.5–35.7)
MCV: 84 fL (ref 79–97)
Monocytes Absolute: 0.2 10*3/uL (ref 0.1–0.9)
Monocytes: 8 %
Neutrophils Absolute: 1.8 10*3/uL (ref 1.4–7.0)
Neutrophils: 58 %
Platelets: 202 10*3/uL (ref 150–450)
RBC: 3.7 x10E6/uL — ABNORMAL LOW (ref 3.77–5.28)
RDW: 14.4 % (ref 11.7–15.4)
WBC: 3 10*3/uL — ABNORMAL LOW (ref 3.4–10.8)

## 2020-11-30 LAB — LIPID PANEL
Chol/HDL Ratio: 2.7 ratio (ref 0.0–4.4)
Cholesterol, Total: 230 mg/dL — ABNORMAL HIGH (ref 100–199)
HDL: 85 mg/dL (ref >39)
LDL Chol Calc (NIH): 132 mg/dL — ABNORMAL HIGH (ref 0–99)
Triglycerides: 76 mg/dL (ref 0–149)
VLDL Cholesterol Cal: 13 mg/dL (ref 5–40)

## 2020-11-30 LAB — HIV ANTIBODY (ROUTINE TESTING W REFLEX): HIV Screen 4th Generation wRfx: NONREACTIVE

## 2020-11-30 LAB — VITAMIN D 25 HYDROXY (VIT D DEFICIENCY, FRACTURES): Vit D, 25-Hydroxy: 13.2 ng/mL — ABNORMAL LOW (ref 30.0–100.0)

## 2020-11-30 LAB — CARBAMAZEPINE LEVEL, TOTAL: Carbamazepine (Tegretol), S: 12 ug/mL (ref 4.0–12.0)

## 2020-11-30 LAB — VITAMIN B12: Vitamin B-12: 308 pg/mL (ref 232–1245)

## 2020-11-30 LAB — RPR: RPR Ser Ql: NONREACTIVE

## 2020-11-30 LAB — LEVETIRACETAM LEVEL: Levetiracetam Lvl: 25.4 ug/mL (ref 10.0–40.0)

## 2020-12-18 ENCOUNTER — Ambulatory Visit (INDEPENDENT_AMBULATORY_CARE_PROVIDER_SITE_OTHER): Payer: 59

## 2020-12-18 ENCOUNTER — Other Ambulatory Visit: Payer: Self-pay

## 2020-12-18 DIAGNOSIS — Z23 Encounter for immunization: Secondary | ICD-10-CM | POA: Diagnosis not present

## 2021-01-06 ENCOUNTER — Other Ambulatory Visit: Payer: Self-pay | Admitting: Family Medicine

## 2021-01-06 DIAGNOSIS — I10 Essential (primary) hypertension: Secondary | ICD-10-CM

## 2021-01-06 NOTE — Telephone Encounter (Signed)
last RF 11/20/20 #90 1 RF

## 2021-03-14 ENCOUNTER — Ambulatory Visit: Payer: 59 | Admitting: Podiatry

## 2021-05-11 NOTE — Progress Notes (Deleted)
Name: Paula [name]   MRN: [id number]    DOB: 1960/10/25   Date:05/11/2021 ? ?     Progress Note ? ?Subjective ? ?Chief Complaint ? ?Follow up  ? ?HPI ? ?HTN: she is compliant with her bp medication, no dizziness  chest pain or plpitation ?  ?Hyperlipidemia:  Her heart attacks strokes risk is low and we will continue to monitor for now, recheck levels ? ?The 10-year ASCVD risk score (Arnett DK, et al., 2019) is: 6.1% ?  Values used to calculate the score: ?    Age: 61 years ?    Sex: Female ?    Is Non-Hispanic African American: Yes ?    Diabetic: No ?    Tobacco smoker: No ?    Systolic Blood Pressure: 136 mmHg ?    Is BP treated: Yes ?    HDL Cholesterol: 86 mg/dL ?    Total Cholesterol: 236 mg/dL  ?  ?Seizure disorder: [facility] neurologist, last visit 04/2020  no recent seizure, able to drive, last levels normal. We will recheck today. Taking medication as prescirbed.  ?  ?Asthma mild: she states currently no problems, she states she has been using Symbicort prn only, reminded her that it expires after 60 days once canister is opened. Occasional dry cough but no wheezing or SOB ?  ?OSA: mild, she used to wear CPAP, lost weight and stopped, but even though she has gained weight she does not want to resume CPAP machine yet. She denies morning headaches, she wakes up with dry mouth.Neurologist saw her in 04/2020 , she is supposed to repeat sleep study  ?  ?Morbid Obesity: her weight has gone up, weigh was up 20 lbs but losing again, today is down 6 lbs , hopefully she can break 300 lbs soon. Discussed healthy diet and portion control  ?  ? ?Diet: discussed importance of portion control  ?Exercise: discussed 150 minutes per week   ?Patient Active Problem List  ? Diagnosis Date Noted  ? 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 Pre-diabetes 01/01/2017  ? 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 Plantar fasciitis, right 06/17/2016  ? Osteoarthritis of right foot 06/17/2016  ? Primary osteoarthritis, right ankle and  foot 09/13/2015  ? Carpal tunnel syndrome, right 09/13/2015  ? Tarsal tunnel syndrome of right side 09/13/2015  ? Varicose veins of both lower extremities without ulcer or inflammation 09/23/2014  ? Asthma, mild intermittent 08/15/2014  ? Benign hypertension 08/15/2014  ? Herpes genitalis in women 08/15/2014  ? Allergic rhinitis 08/15/2014  ? Cerebral seizure (HCC) 08/15/2014  ? Vitamin D deficiency 08/15/2014  ? Morbid obesity (HCC) 08/03/2014  ? Adhesive capsulitis of left shoulder 06/16/2013  ? Obstructive sleep apnea of adult 06/16/2013  ? Epilepsy (HCC) 06/16/2013  ? Anemia, iron deficiency 09/30/2006  ? ? ?Past Surgical History:  ?Procedure Laterality Date  ? BREAST EXCISIONAL BIOPSY Left   ? age 23 tumor removed  ? BREAST SURGERY Left   ? benign lump at age 89  ? ? ?Family History  ?Problem Relation Age of Onset  ? Cancer Mother   ?     Breast  ? Hypertension Mother   ? Diabetes Mother   ? Hyperlipidemia Mother   ? Breast cancer Mother   ? ? ?Social History  ? ?Tobacco Use  ? Smoking status: Never  ? Smokeless tobacco: Never  ?Substance Use Topics  ? Alcohol use: No  ?  Alcohol/week: 0.0 standard drinks  ? ? ? ?  Current Outpatient Medications:  ?  topiramate (TOPAMAX) 100 MG tablet, Take 3 tablets by mouth 3 (three) times daily., Disp: , Rfl:  ?  albuterol (VENTOLIN HFA) 108 (90 Base) MCG/ACT inhaler, Inhale 2 puffs into the lungs every 6 (six) hours as needed., Disp: 1 each, Rfl: 0 ?  amLODipine (NORVASC) 5 MG tablet, Take 1 tablet (5 mg total) by mouth daily., Disp: 90 tablet, Rfl: 1 ?  budesonide-formoterol (SYMBICORT) 160-4.5 MCG/ACT inhaler, Inhale 2 puffs into the lungs 2 (two) times daily., Disp: 1 each, Rfl: 2 ?  carbamazepine (TEGRETOL) 200 MG tablet, TAKE 1 AND 1/2 TABLETS BY  MOUTH IN THE MORNING AND  NOON AND 2 AND 1/2 TABLETS  BY MOUTH AT NIGHT, Disp: , Rfl:  ?  cetirizine (ZYRTEC) 10 MG tablet, Take 1 tablet (10 mg total) by mouth daily. (Patient taking differently: Take 10 mg by mouth as  needed.), Disp: 30 tablet, Rfl: 1 ?  fluticasone (FLONASE) 50 MCG/ACT nasal spray, Place 2 sprays into both nostrils daily., Disp: 48 g, Rfl: 1 ?  levETIRAcetam (KEPPRA) 500 MG tablet, Take 3 tablets by mouth 2 (two) times daily., Disp: , Rfl:  ?  PREVIDENT 5000 BOOSTER PLUS 1.1 % PSTE, , Disp: , Rfl:  ?  telmisartan-hydrochlorothiazide (MICARDIS HCT) 80-12.5 MG tablet, Take 1 tablet by mouth daily., Disp: 90 tablet, Rfl: 1 ?  topiramate (TOPAMAX) 100 MG tablet, Take 3 tablets (300 mg total) by mouth 3 (three) times daily., Disp: 60 tablet, Rfl: 0 ?  valACYclovir (VALTREX) 1000 MG tablet, Take 1 tablet (1,000 mg total) by mouth 2 (two) times daily., Disp: 20 tablet, Rfl: 0 ?  Vitamin D, Ergocalciferol, (DRISDOL) 1.25 MG (50000 UNIT) CAPS capsule, Take 1 capsule (50,000 Units total) by mouth every 7 (seven) days., Disp: 12 capsule, Rfl: 1 ? ?Allergies  ?Allergen Reactions  ? Ace Inhibitors   ? ? ?I personally reviewed {Reviewed:14835} with the patient/caregiver today. ? ? ?ROS ? ?*** ? ?Objective ? ?There were no vitals filed for this visit. ? ?There is no height or weight on file to calculate BMI. ? ?Physical Exam ?*** ? ?No results found for this or any previous visit (from the past 2160 hour(s)). ? ?Diabetic Foot Exam: ?Diabetic Foot Exam - Simple   ?No data filed ?  ? ?*** ? ?PHQ2/9: ?Depression screen [facility] 2/9 11/20/2020 07/05/2020 05/17/2020 11/22/2019 02/22/2019  ?Decreased Interest 0 0 0 0 0  ?Down, Depressed, Hopeless 0 0 0 1 0  ?PHQ - 2 Score 0 0 0 1 0  ?Altered sleeping - - - - 0  ?Tired, decreased energy - - - - 0  ?Change in appetite - - - - 0  ?Feeling bad or failure about yourself  - - - - 0  ?Trouble concentrating - - - - 0  ?Moving slowly or fidgety/restless - - - - 0  ?Suicidal thoughts - - - - 0  ?PHQ-9 Score - - - - 0  ?Difficult doing work/chores - - - - Not difficult at all  ?  ?phq 9 is {gen pos neg:315643} ?*** ? ?Fall Risk: ?Fall Risk  11/20/2020 07/05/2020 05/17/2020 11/22/2019 05/17/2019  ?Falls in the  past year? 0 0 0 0 0  ?Number falls in past yr: 0 0 0 0 0  ?Injury with Fall? 0 0 0 0 0  ?Comment - - - - -  ?Risk for fall due to : No Fall Risks - - - -  ?Follow up Falls prevention discussed - - - -  ? ?*** ? ? ?  Functional Status Survey: ?  ?*** ? ? ?Assessment & Plan ? ?*** ?There are no diagnoses linked to this encounter. ?

## 2021-05-14 ENCOUNTER — Ambulatory Visit: Payer: 59 | Admitting: Family Medicine

## 2021-05-21 ENCOUNTER — Ambulatory Visit: Payer: 59 | Admitting: Family Medicine

## 2021-05-21 ENCOUNTER — Encounter: Payer: Self-pay | Admitting: Family Medicine

## 2021-05-21 ENCOUNTER — Other Ambulatory Visit: Payer: Self-pay

## 2021-05-21 DIAGNOSIS — E7800 Pure hypercholesterolemia, unspecified: Secondary | ICD-10-CM

## 2021-05-21 DIAGNOSIS — R7303 Prediabetes: Secondary | ICD-10-CM | POA: Diagnosis not present

## 2021-05-21 DIAGNOSIS — I10 Essential (primary) hypertension: Secondary | ICD-10-CM

## 2021-05-21 DIAGNOSIS — R569 Unspecified convulsions: Secondary | ICD-10-CM | POA: Diagnosis not present

## 2021-05-21 DIAGNOSIS — E538 Deficiency of other specified B group vitamins: Secondary | ICD-10-CM

## 2021-05-21 DIAGNOSIS — J4520 Mild intermittent asthma, uncomplicated: Secondary | ICD-10-CM

## 2021-05-21 DIAGNOSIS — E559 Vitamin D deficiency, unspecified: Secondary | ICD-10-CM

## 2021-05-21 DIAGNOSIS — K219 Gastro-esophageal reflux disease without esophagitis: Secondary | ICD-10-CM

## 2021-05-21 DIAGNOSIS — G4733 Obstructive sleep apnea (adult) (pediatric): Secondary | ICD-10-CM

## 2021-05-21 DIAGNOSIS — D508 Other iron deficiency anemias: Secondary | ICD-10-CM

## 2021-05-21 MED ORDER — TELMISARTAN-HCTZ 80-12.5 MG PO TABS: 1.0000 | ORAL_TABLET | Freq: Every day | ORAL | 1 refills | Status: DC

## 2021-05-21 MED ORDER — VITAMIN D (ERGOCALCIFEROL) 1.25 MG (50000 UNIT) PO CAPS: 50000.0000 [IU] | ORAL_CAPSULE | ORAL | 1 refills | Status: DC

## 2021-05-21 MED ORDER — AMLODIPINE BESYLATE 5 MG PO TABS: 5.0000 mg | ORAL_TABLET | Freq: Every day | ORAL | 1 refills | Status: DC

## 2021-05-21 NOTE — Progress Notes (Signed)
Name: Paula [name]   MRN: [id number]    DOB: 07/23/1960   Date:05/21/2021       Progress Note  Subjective  Chief Complaint  Medication Refill  HPI  HTN: she is compliant with her bp medication, no dizziness , chest pain or plpitation. Denies sob    Hyperlipidemia:    The 10-year ASCVD risk score (Arnett DK, et al., 2019) is: 5.5%   Values used to calculate the score:     Age: 61 years     Sex: Female     Is Non-Hispanic African American: Yes     Diabetic: No     Tobacco smoker: No     Systolic Blood Pressure: 132 mmHg     Is BP treated: Yes     HDL Cholesterol: 85 mg/dL     Total Cholesterol: 230 mg/dL    Seizure disorder: she is still under the care of [facility] neurologist, last visit 04/2020  no recent seizure, able to drive, last levels normal. She is compliant with medications, reminded her to schedule a follow up with them.   Asthma mild: she states currently no problems, she states she has been using Symbicort prn only, reminded her that it expires after 60 days once canister is opened. Occasional dry cough, wheezing or SOB   OSA: mild, she used to wear CPAP, lost weight and stopped, but even though she has gained weight she does not want to resume CPAP machine .  She denies morning headaches however  she wakes up with dry mouth.Neurologist saw her in 04/2020 , she is supposed to repeat sleep study but not ready to do it   Morbid Obesity: her weight has been stable over the past 6 month. She states she has small portions  Iron deficiency anemia and leucopenia: she used to see Dr. Orlie Dakin but was released, advised to go back if Hemoglobin drops below 10 and iron storages also drops. 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   Plantar fasciitis, right 06/17/2016   Osteoarthritis of right foot 06/17/2016    Primary osteoarthritis, right ankle and foot 09/13/2015   Carpal tunnel syndrome, right 09/13/2015   Tarsal tunnel syndrome of right side 09/13/2015   Varicose veins of both lower extremities without ulcer or inflammation 09/23/2014   Asthma, mild intermittent 08/15/2014   Benign hypertension 08/15/2014   Herpes genitalis in women 08/15/2014   Allergic rhinitis 08/15/2014   Cerebral seizure (HCC) 08/15/2014   Vitamin D deficiency 08/15/2014   Morbid obesity (HCC) 08/03/2014   Adhesive capsulitis of left shoulder 06/16/2013   Obstructive sleep apnea of adult 06/16/2013   Epilepsy (HCC) 06/16/2013   Anemia, iron deficiency 09/30/2006    Past Surgical History:  Procedure Laterality Date   BREAST EXCISIONAL BIOPSY Left    age 41 tumor removed   BREAST SURGERY Left    benign lump at age 39    Family History  Problem Relation Age of Onset   Cancer Mother        Breast   Hypertension Mother    Diabetes Mother    Hyperlipidemia Mother    Breast cancer Mother     Social History   Tobacco Use   Smoking status: Never   Smokeless tobacco: Never  Substance Use Topics   Alcohol use:  No    Alcohol/week: 0.0 standard drinks     Current Outpatient Medications:    albuterol (VENTOLIN HFA) 108 (90 Base) MCG/ACT inhaler, Inhale 2 puffs into the lungs every 6 (six) hours as needed., Disp: 1 each, Rfl: 0   amLODipine (NORVASC) 5 MG tablet, Take 1 tablet (5 mg total) by mouth daily., Disp: 90 tablet, Rfl: 1   budesonide-formoterol (SYMBICORT) 160-4.5 MCG/ACT inhaler, Inhale 2 puffs into the lungs 2 (two) times daily., Disp: 1 each, Rfl: 2   carbamazepine (TEGRETOL) 200 MG tablet, TAKE 1 AND 1/2 TABLETS BY  MOUTH IN THE MORNING AND  NOON AND 2 AND 1/2 TABLETS  BY MOUTH AT NIGHT, Disp: , Rfl:    cetirizine (ZYRTEC) 10 MG tablet, Take 1 tablet (10 mg total) by mouth daily. (Patient taking differently: Take 10 mg by mouth as needed.), Disp: 30 tablet, Rfl: 1   fluticasone (FLONASE) 50  MCG/ACT nasal spray, Place 2 sprays into both nostrils daily., Disp: 48 g, Rfl: 1   levETIRAcetam (KEPPRA) 500 MG tablet, Take 3 tablets by mouth 2 (two) times daily., Disp: , Rfl:    PREVIDENT 5000 BOOSTER PLUS 1.1 % PSTE, , Disp: , Rfl:    telmisartan-hydrochlorothiazide (MICARDIS HCT) 80-12.5 MG tablet, Take 1 tablet by mouth daily., Disp: 90 tablet, Rfl: 1   topiramate (TOPAMAX) 100 MG tablet, Take 3 tablets (300 mg total) by mouth 3 (three) times daily., Disp: 60 tablet, Rfl: 0   topiramate (TOPAMAX) 100 MG tablet, Take 3 tablets by mouth 3 (three) times daily., Disp: , Rfl:    valACYclovir (VALTREX) 1000 MG tablet, Take 1 tablet (1,000 mg total) by mouth 2 (two) times daily., Disp: 20 tablet, Rfl: 0   Vitamin D, Ergocalciferol, (DRISDOL) 1.25 MG (50000 UNIT) CAPS capsule, Take 1 capsule (50,000 Units total) by mouth every 7 (seven) days., Disp: 12 capsule, Rfl: 1  Allergies  Allergen Reactions   Ace Inhibitors     I personally reviewed active problem list, medication list, allergies, family history, social history, health maintenance with the patient/caregiver today.   ROS  Constitutional: Negative for fever or weight change.  Respiratory: Negative for cough and shortness of breath.   Cardiovascular: Negative for chest pain or palpitations.  Gastrointestinal: Negative for abdominal pain, no bowel changes.  Musculoskeletal: Negative for gait problem or joint swelling.  Skin: Negative for rash.  Neurological: Negative for dizziness or headache.  No other specific complaints in a complete review of systems (except as listed in HPI above).   Objective  Vitals:   05/21/21 1557  BP: 132/84  Pulse: 92  Resp: 16  SpO2: 100%  Weight: (!) 302 lb (137 kg)  Height: 5\' 4"  (1.626 m)    Body mass index is 51.84 kg/m.  Physical Exam  Constitutional: Patient appears well-developed and well-nourished. Obese  No distress.  HEENT: head atraumatic, normocephalic, pupils equal and  reactive to light, neck supple Cardiovascular: Normal rate, regular rhythm and normal heart sounds.  No murmur heard. No BLE edema. Pulmonary/Chest: Effort normal and breath sounds normal. No respiratory distress. Abdominal: Soft.  There is no tenderness. Psychiatric: Patient has a normal mood and affect. behavior is normal. Judgment and thought content normal.   PHQ2/9: Depression screen [facility] 2/9 05/21/2021 11/20/2020 07/05/2020 05/17/2020 11/22/2019  Decreased Interest 0 0 0 0 0  Down, Depressed, Hopeless 0 0 0 0 1  PHQ - 2 Score 0 0 0 0 1  Altered sleeping 0 - - - -  Tired,  decreased energy 0 - - - -  Change in appetite 0 - - - -  Feeling bad or failure about yourself  0 - - - -  Trouble concentrating 0 - - - -  Moving slowly or fidgety/restless 0 - - - -  Suicidal thoughts 0 - - - -  PHQ-9 Score 0 - - - -  Difficult doing work/chores - - - - -    phq 9 is negative   Fall Risk: Fall Risk  05/21/2021 11/20/2020 07/05/2020 05/17/2020 11/22/2019  Falls in the past year? 0 0 0 0 0  Number falls in past yr: 0 0 0 0 0  Injury with Fall? 0 0 0 0 0  Comment - - - - -  Risk for fall due to : No Fall Risks No Fall Risks - - -  Follow up Falls prevention discussed Falls prevention discussed - - -     Functional Status Survey: Is the patient deaf or have difficulty hearing?: No Does the patient have difficulty seeing, even when wearing glasses/contacts?: No Does the patient have difficulty concentrating, remembering, or making decisions?: No Does the patient have difficulty walking or climbing stairs?: No Does the patient have difficulty dressing or bathing?: No Does the patient have difficulty doing errands alone such as visiting a doctor's office or shopping?: No    Assessment & Plan  1. Morbid obesity (HCC)  Discussed with the patient the risk posed by an increased BMI. Discussed importance of portion control, calorie counting and at least 150 minutes of physical activity weekly. Avoid  sweet beverages and drink more water. Eat at least 6 servings of fruit and vegetables daily    2. Seizure (HCC)  - Carbamazepine level, total - Levetiracetam level  3. Pre-diabetes  - Hemoglobin A1c  4. B12 deficiency  - CBC with Differential/Platelet - Vitamin B12  5. Obstructive sleep apnea of adult   6. GERD without esophagitis  Doing well at this time   7. Mild intermittent asthma without complication  Doing well at this time  8. Vitamin D deficiency  - VITAMIN D 25 Hydroxy (Vit-D Deficiency, Fractures) - Vitamin D, Ergocalciferol, (DRISDOL) 1.25 MG (50000 UNIT) CAPS capsule; Take 1 capsule (50,000 Units total) by mouth every 7 (seven) days.  Dispense: 12 capsule; Refill: 1  9. Pure hypercholesterolemia  - Lipid panel  10. Benign hypertension  - Comprehensive metabolic panel - amLODipine (NORVASC) 5 MG tablet; Take 1 tablet (5 mg total) by mouth daily.  Dispense: 90 tablet; Refill: 1 - telmisartan-hydrochlorothiazide (MICARDIS HCT) 80-12.5 MG tablet; Take 1 tablet by mouth daily.  Dispense: 90 tablet; Refill: 1  11. Other iron deficiency anemia  - CBC with Differential/Platelet - Iron, TIBC and Ferritin Panel

## 2021-05-23 ENCOUNTER — Ambulatory Visit: Payer: 59 | Admitting: Family Medicine

## 2021-06-13 ENCOUNTER — Ambulatory Visit: Payer: Self-pay | Admitting: *Deleted

## 2021-06-13 NOTE — Telephone Encounter (Signed)
Summary: hoarseness with productive cough  ? Patient called in very hoarse and stated that she also now have a productive cough. Going on for a few days now per patient it is due to change in the weather and allergies. Please call patient to advise  Ph# [phone]  ?  ? ?Reason for Disposition ? [1] Continuous (nonstop) coughing interferes with work or school AND [2] no improvement using cough treatment per Care Advice ? ?Answer Assessment - Initial Assessment Questions ?1. ONSET: "When did the cough begin?"  ?    Over the weekend ?2. SEVERITY: "How bad is the cough today?"  ?    Productive- bad yesterday- patient is using inhaler and Rx cough medication ?3. SPUTUM: "Describe the color of your sputum" (none, dry cough; clear, white, yellow, green) ?    Yellow/green ?4. HEMOPTYSIS: "Are you coughing up any blood?" If so ask: "How much?" (flecks, streaks, tablespoons, etc.) ?    no ?5. DIFFICULTY BREATHING: "Are you having difficulty breathing?" If Yes, ask: "How bad is it?" (e.g., mild, moderate, severe)  ?  - MILD: No SOB at rest, mild SOB with walking, speaks normally in sentences, can lie down, no retractions, pulse < 100.  ?  - MODERATE: SOB at rest, SOB with minimal exertion and prefers to sit, cannot lie down flat, speaks in phrases, mild retractions, audible wheezing, pulse 100-120.  ?  - SEVERE: Very SOB at rest, speaks in single words, struggling to breathe, sitting hunched forward, retractions, pulse > 120  ?    Normal ?6. FEVER: "Do you have a fever?" If Yes, ask: "What is your temperature, how was it measured, and when did it start?" ?    Not sure ?7. CARDIAC HISTORY: "Do you have any history of heart disease?" (e.g., heart attack, congestive heart failure)  ?    no ?8. LUNG HISTORY: "Do you have any history of lung disease?"  (e.g., pulmonary embolus, asthma, emphysema) ?    COPD ?9. PE RISK FACTORS: "Do you have a history of blood clots?" (or: recent major surgery, recent prolonged travel,  bedridden) ?    na ?10. OTHER SYMPTOMS: "Do you have any other symptoms?" (e.g., runny nose, wheezing, chest pain) ?      hoarseness ?11. PREGNANCY: "Is there any chance you are pregnant?" "When was your last menstrual period?" ?      na ?12. TRAVEL: "Have you traveled out of the country in the last month?" (e.g., travel history, exposures) ?      na ? ?Protocols used: Cough - Acute Productive-A-AH ? ?

## 2021-06-13 NOTE — Telephone Encounter (Signed)
?  Chief Complaint: cough, hoarseness ?Symptoms: cough- chest congestion ?Frequency: started over the weekend ?Pertinent Negatives: Patient denies SOB ?Disposition: [] ED /[] Urgent Care (no appt availability in office) / [x] Appointment(In office/virtual)/ []  [place] [facility]/ [] Home Care/ [] Refused Recommended Disposition /[] [place] [facility]/ []  Follow-up with PCP ?Additional Notes: Advised OTC/home treatments , hydration until seen in office tomorrow  ?

## 2021-06-14 ENCOUNTER — Ambulatory Visit (INDEPENDENT_AMBULATORY_CARE_PROVIDER_SITE_OTHER): Payer: 59 | Admitting: Internal Medicine

## 2021-06-14 ENCOUNTER — Telehealth: Payer: Self-pay | Admitting: Internal Medicine

## 2021-06-14 ENCOUNTER — Encounter: Payer: Self-pay | Admitting: Internal Medicine

## 2021-06-14 VITALS — BP 132/84 | HR 111 | Temp 99.4°F | Resp 18 | Wt 298.1 lb

## 2021-06-14 DIAGNOSIS — J302 Other seasonal allergic rhinitis: Secondary | ICD-10-CM

## 2021-06-14 DIAGNOSIS — J4520 Mild intermittent asthma, uncomplicated: Secondary | ICD-10-CM

## 2021-06-14 MED ORDER — ALBUTEROL SULFATE HFA 108 (90 BASE) MCG/ACT IN AERS: 2.0000 | INHALATION_SPRAY | Freq: Four times a day (QID) | RESPIRATORY_TRACT | 0 refills | Status: AC | PRN

## 2021-06-14 MED ORDER — CETIRIZINE HCL 10 MG PO TABS: 10.0000 mg | ORAL_TABLET | Freq: Every day | ORAL | 1 refills | Status: DC

## 2021-06-14 MED ORDER — METHYLPREDNISOLONE 4 MG PO TBPK: ORAL_TABLET | ORAL | 0 refills | Status: DC

## 2021-06-14 NOTE — Progress Notes (Signed)
? ?Acute Office Visit ? ?Subjective:  ? ? Patient ID: Paula [name], female    DOB: 1960/03/28, 61 y.o.   MRN: [id number] ? ?Chief Complaint  ?Patient presents with  ? URI  ?  Cough, runny nose, hoarsness  ? ? ?HPI ?Patient is in today for hoarseness and cough. She does have asthma and is on Symbicort and Albuterol PRN. She has been using and Symbicort occasionally and is out of the Albuterol. Cough started 5 days ago.  ? ?URI Compliant:  ?-Worst symptom: voice change and cough ?-Fever: no ?-Cough: yes, productive thick and green ?-Shortness of breath: no ?-Wheezing: yes ?-Chest pain: no ?-Chest congestion: yes ?-Nasal congestion: no ?-Runny nose: yes, clear ?-Post nasal drip: no ?-Sneezing: no ?-Sore throat: no ?-Sinus pressure: no ?-Headache: no ?-Face pain: no ?-Ear pain: no  ?-Ear pressure: no  ?-Vomiting: no ?-Sick contacts: no ?-Context: fluctuating ?-Relief with OTC cold/cough medications: no  ?-Treatments attempted: anti-histamine. Has been using Symbicort sometimes but out of Albuterol  ? ? ?Past Medical History:  ?Diagnosis Date  ? Allergy   ? Anemia   ? Asthma   ? Epilepsy ([place])   ? Hypertension   ? ? ?Past Surgical History:  ?Procedure Laterality Date  ? BREAST EXCISIONAL BIOPSY Left   ? age 63 tumor removed  ? BREAST SURGERY Left   ? benign lump at age 59  ? ? ?Family History  ?Problem Relation Age of Onset  ? Cancer Mother   ?     Breast  ? Hypertension Mother   ? Diabetes Mother   ? Hyperlipidemia Mother   ? Breast cancer Mother   ? ? ?Social History  ? ?Socioeconomic History  ? Marital status: Soil scientist  ?  Spouse name: Erbin   ? Number of children: 0  ? Years of education: Not on file  ? Highest education level: Associate degree: occupational, Hotel manager, or vocational program  ?Occupational History  ? Occupation: lab specialist  ?  Employer: [employer]  ?Tobacco Use  ? Smoking status: Never  ? Smokeless tobacco: Never  ?Vaping Use  ? Vaping Use: Never used  ?Substance and Sexual Activity  ?  Alcohol use: No  ?  Alcohol/week: 0.0 standard drinks  ? Drug use: No  ? Sexual activity: Not Currently  ?Other Topics Concern  ? Not on file  ?Social History Narrative  ? Lives with  boyfriend  ? Never had children   ? Works for [employer]   ? ?Social Determinants of Health  ? ?Financial Resource Strain: Low Risk   ? Difficulty of Paying Living Expenses: Not hard at all  ?Food Insecurity: No Food Insecurity  ? Worried About Charity fundraiser in the Last Year: Never true  ? Ran Out of Food in the Last Year: Never true  ?Transportation Needs: No Transportation Needs  ? Lack of Transportation (Medical): No  ? Lack of Transportation (Non-Medical): No  ?Physical Activity: Inactive  ? Days of Exercise per Week: 0 days  ? Minutes of Exercise per Session: 0 min  ?Stress: No Stress Concern Present  ? Feeling of Stress : Only a little  ?Social Connections: Moderately Isolated  ? Frequency of Communication with Friends and Family: More than three times a week  ? Frequency of Social Gatherings with Friends and Family: Three times a week  ? Attends Religious Services: 1 to 4 times per year  ? Active Member of Clubs or Organizations: No  ? Attends Archivist  Meetings: Never  ? Marital Status: Never married  ?Intimate Partner Violence: Not At Risk  ? Fear of Current or Ex-Partner: No  ? Emotionally Abused: No  ? Physically Abused: No  ? Sexually Abused: No  ? ? ?Outpatient Medications Prior to Visit  ?Medication Sig Dispense Refill  ? albuterol (VENTOLIN HFA) 108 (90 Base) MCG/ACT inhaler Inhale 2 puffs into the lungs every 6 (six) hours as needed. 1 each 0  ? amLODipine (NORVASC) 5 MG tablet Take 1 tablet (5 mg total) by mouth daily. 90 tablet 1  ? budesonide-formoterol (SYMBICORT) 160-4.5 MCG/ACT inhaler Inhale 2 puffs into the lungs 2 (two) times daily. 1 each 2  ? carbamazepine (TEGRETOL) 200 MG tablet TAKE 1 AND 1/2 TABLETS BY  MOUTH IN THE MORNING AND  NOON AND 2 AND 1/2 TABLETS  BY MOUTH AT NIGHT    ? cetirizine  (ZYRTEC) 10 MG tablet Take 1 tablet (10 mg total) by mouth daily. (Patient taking differently: Take 10 mg by mouth as needed.) 30 tablet 1  ? fluticasone (FLONASE) 50 MCG/ACT nasal spray Place 2 sprays into both nostrils daily. 48 g 1  ? levETIRAcetam (KEPPRA) 500 MG tablet Take 3 tablets by mouth 2 (two) times daily.    ? PREVIDENT 5000 BOOSTER PLUS 1.1 % PSTE     ? telmisartan-hydrochlorothiazide (MICARDIS HCT) 80-12.5 MG tablet Take 1 tablet by mouth daily. 90 tablet 1  ? topiramate (TOPAMAX) 100 MG tablet Take 3 tablets by mouth 3 (three) times daily.    ? valACYclovir (VALTREX) 1000 MG tablet Take 1 tablet (1,000 mg total) by mouth 2 (two) times daily. 20 tablet 0  ? Vitamin D, Ergocalciferol, (DRISDOL) 1.25 MG (50000 UNIT) CAPS capsule Take 1 capsule (50,000 Units total) by mouth every 7 (seven) days. 12 capsule 1  ? ?No facility-administered medications prior to visit.  ? ? ?Allergies  ?Allergen Reactions  ? Ace Inhibitors   ? ? ?Review of Systems  ?Constitutional:  Negative for chills and fever.  ?HENT:  Positive for postnasal drip and rhinorrhea. Negative for congestion, ear pain, sinus pressure, sinus pain and sore throat.   ?Respiratory:  Positive for cough and wheezing. Negative for shortness of breath.   ?Cardiovascular:  Negative for chest pain.  ?Gastrointestinal:  Negative for abdominal pain, diarrhea, nausea and vomiting.  ?Musculoskeletal:  Negative for arthralgias and myalgias.  ?Skin: Negative.   ?Neurological:  Negative for headaches.  ? ?   ?Objective:  ?  ?Physical Exam ?Constitutional:   ?   Appearance: Normal appearance. She is obese.  ?HENT:  ?   Head: Normocephalic and atraumatic.  ?   Right Ear: Tympanic membrane, ear canal and external ear normal.  ?   Left Ear: Tympanic membrane, ear canal and external ear normal.  ?   Nose: Nose normal.  ?   Mouth/Throat:  ?   Mouth: Mucous membranes are moist.  ?   Comments: PND present ?Eyes:  ?   Conjunctiva/sclera: Conjunctivae normal.   ?Cardiovascular:  ?   Rate and Rhythm: Normal rate and regular rhythm.  ?Pulmonary:  ?   Effort: Pulmonary effort is normal.  ?   Breath sounds: Wheezing present.  ?   Comments: Expiratory wheezes on left  ?Musculoskeletal:  ?   Right lower leg: No edema.  ?   Left lower leg: No edema.  ?Skin: ?   General: Skin is warm and dry.  ?Neurological:  ?   General: No focal deficit present.  ?  Mental Status: She is alert. Mental status is at baseline.  ?Psychiatric:     ?   Mood and Affect: Mood normal.     ?   Behavior: Behavior normal.  ? ? ?BP 132/84   Pulse (!) 111   Temp 99.4 ?F (37.4 ?C)   Resp 18   Wt 298 lb 1.6 oz (135.2 kg)   LMP 09/09/2014   SpO2 99%   BMI 51.17 kg/m?  ?Wt Readings from Last 3 Encounters:  ?06/14/21 298 lb 1.6 oz (135.2 kg)  ?05/21/21 (!) 302 lb (137 kg)  ?11/20/20 (!) 302 lb (137 kg)  ? ? ?Health Maintenance Due  ?Topic Date Due  ? MAMMOGRAM  08/28/2018  ? COVID-19 Vaccine (3 - Moderna risk series) 12/06/2019  ? Zoster Vaccines- Shingrix (2 of 2) 02/12/2021  ? ? ?There are no preventive care reminders to display for this patient. ? ? ?Lab Results  ?Component Value Date  ? TSH 1.680 12/31/2016  ? ?Lab Results  ?Component Value Date  ? WBC 3.0 (L) 11/27/2020  ? HGB 9.8 (L) 11/27/2020  ? HCT 31.1 (L) 11/27/2020  ? MCV 84 11/27/2020  ? PLT 202 11/27/2020  ? ?Lab Results  ?Component Value Date  ? NA 139 11/27/2020  ? K 3.9 11/27/2020  ? CO2 22 11/27/2020  ? GLUCOSE 88 11/27/2020  ? BUN 13 11/27/2020  ? CREATININE 0.87 11/27/2020  ? BILITOT 0.2 11/27/2020  ? ALKPHOS 130 (H) 11/27/2020  ? AST 9 11/27/2020  ? ALT 8 11/27/2020  ? PROT 7.0 11/27/2020  ? ALBUMIN 4.1 11/27/2020  ? CALCIUM 9.1 11/27/2020  ? ANIONGAP 8 04/25/2018  ? EGFR 77 11/27/2020  ? ?Lab Results  ?Component Value Date  ? CHOL 230 (H) 11/27/2020  ? ?Lab Results  ?Component Value Date  ? HDL 85 11/27/2020  ? ?Lab Results  ?Component Value Date  ? LDLCALC 132 (H) 11/27/2020  ? ?Lab Results  ?Component Value Date  ? TRIG 76  11/27/2020  ? ?Lab Results  ?Component Value Date  ? CHOLHDL 2.7 11/27/2020  ? ?Lab Results  ?Component Value Date  ? HGBA1C 6.0 (H) 11/27/2020  ? ? ?   ?Assessment & Plan:  ? ?1. Mild intermittent asthma without complication: Whee

## 2021-06-14 NOTE — Telephone Encounter (Signed)
Both refilled 06/14/2021. ?Requested Prescriptions  ?Pending Prescriptions Disp Refills  ?? albuterol (VENTOLIN HFA) 108 (90 Base) MCG/ACT inhaler [Pharmacy Med Name: ALBUTEROL HFA INH (200 PUFFS) 6.7GM] 20.1 g   ?  Sig: INHALE 2 PUFFS INTO THE LUNGS EVERY 6 HOURS AS NEEDED  ?  ? Pulmonology:  Beta Agonists 2 Failed - 06/14/2021 10:14 AM  ?  ?  Failed - Last Heart Rate in normal range  ?  Pulse Readings from Last 1 Encounters:  ?06/14/21 (!) 111  ?   ?  ?  Passed - Last BP in normal range  ?  BP Readings from Last 1 Encounters:  ?06/14/21 132/84  ?   ?  ?  Passed - Valid encounter within last 12 months  ?  Recent Outpatient Visits   ?      ? Today Mild intermittent asthma without complication  ? [facility] Margarita Mail, DO  ? 3 weeks ago Morbid obesity (HCC)  ? [facility], Danna Hefty, MD  ? 6 months ago Morbid obesity [facility])  ? [facility], Danna Hefty, MD  ? 11 months ago Upper respiratory tract infection, unspecified type  ? [facility] Alba Cory, MD  ? 1 year ago Seizure [facility])  ? [facility] Alba Cory, MD  ?  ?  ?Future Appointments   ?        ? In 3 months Alba Cory, MD [facility]  ?  ? ?  ?  ?  ?? cetirizine (ZYRTEC) 10 MG tablet [Pharmacy Med Name: CETIRIZINE 10MG  TABLETS] 90 tablet   ?  Sig: TAKE 1 TABLET(10 MG) BY MOUTH DAILY  ?  ? Ear, Nose, and Throat:  Antihistamines 2 Passed - 06/14/2021 10:14 AM  ?  ?  Passed - Cr in normal range and within 360 days  ?  Creatinine  ?Date Value Ref Range Status  ?04/09/2013 1.01 0.60 - 1.30 mg/dL Final  ? ?Creatinine, Ser  ?Date Value Ref Range Status  ?11/27/2020 0.87 0.57 - 1.00 mg/dL Final  ?   ?  ?  Passed - Valid encounter within last 12 months  ?  Recent Outpatient Visits   ?      ? Today Mild intermittent asthma without complication  ? [facility], DO  ? 3 weeks ago Morbid  obesity (HCC)  ? [facility], Leugnies, MD  ? 6 months ago Morbid obesity [facility])  ? [name], Leugnies, MD  ? 11 months ago Upper respiratory tract infection, unspecified type  ? [facility], MD  ? 1 year ago Seizure [facility])  ? [facility], MD  ?  ?  ?Future Appointments   ?        ? In 3 months Alba Cory, MD [facility], [facility]  ?  ? ?  ?  ?  ? ?

## 2021-06-14 NOTE — Patient Instructions (Addendum)
It was great seeing you today! ? ?Plan discussed at today's visit: ?-Symbicort 2 puffs daily ?-Can use Albuterol as needed for wheezing ?-Start taking Zyrtec for allergy symptoms and Medrol dosepack for congestion and wheezing  ? ?Follow up in: as needed  ? ?Take care and let us know if you have any questions or concerns prior to your next visit. ? ?Dr. Caralee Ates ? ?

## 2021-08-28 ENCOUNTER — Other Ambulatory Visit: Payer: Self-pay

## 2021-08-28 ENCOUNTER — Emergency Department: Payer: 59

## 2021-08-28 ENCOUNTER — Emergency Department
Admission: EM | Admit: 2021-08-28 | Discharge: 2021-08-28 | Disposition: A | Discharge status: Home / Self Care | Payer: 59 | Attending: Emergency Medicine | Admitting: Emergency Medicine

## 2021-08-28 DIAGNOSIS — I10 Essential (primary) hypertension: Secondary | ICD-10-CM | POA: Insufficient documentation

## 2021-08-28 DIAGNOSIS — Y92410 Unspecified street and highway as the place of occurrence of the external cause: Secondary | ICD-10-CM | POA: Diagnosis not present

## 2021-08-28 DIAGNOSIS — M79641 Pain in right hand: Secondary | ICD-10-CM | POA: Diagnosis not present

## 2021-08-28 DIAGNOSIS — R0789 Other chest pain: Secondary | ICD-10-CM | POA: Insufficient documentation
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
## 2021-09-19 NOTE — Progress Notes (Signed)
Name: Paula [name]   MRN: [id number]    DOB: August 12, 1960   Date:09/24/2021       Progress Note  Subjective  Chief Complaint  Follow Up  HPI  HTN: she is compliant with her bp medication, taking Telmisartan hctz and Amlodipine. She denies dizziness, or chest tightness or palpitation   Hyperlipidemia:    The 10-year ASCVD risk score (Arnett DK, et al., 2019) is: 6.3%   Values used to calculate the score:     Age: 61 years     Sex: Female     Is Non-Hispanic African American: Yes     Diabetic: No     Tobacco smoker: No     Systolic Blood Pressure: 134 mmHg     Is BP treated: Yes     HDL Cholesterol: 85 mg/dL     Total Cholesterol: 230 mg/dL    Seizure disorder: she is still under the care of [facility] neurologist, last visit 04/2020  no recent seizure, able to drive, last levels normal. She is compliant with medications but has not been back for follow , explained how important it is to re-schedule it . I also reminded her to get labs done. We ordered back in March 2023   Asthma mild: she states currently no problems, she states she has been using Symbicort prn , she states currently doing well    OSA: mild, she used to wear CPAP, lost weight and stopped, but even though she has gained weight she does not want to resume CPAP machine .  She denies morning headaches however  she wakes up with dry mouth.Neurologist saw her in 04/2020 , she is supposed to repeat sleep study but not ready to do it. Unchanged    Morbid Obesity:she has lost 5 lbs since last visit, she states eating smaller portions   Iron deficiency anemia and leucopenia: she used to see Dr. Orlie Dakin but was released, advised to go back if Hemoglobin drops below 10 and iron storages also drops. 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   Plantar fasciitis, right  06/17/2016   Osteoarthritis of right foot 06/17/2016   Primary osteoarthritis, right ankle and foot 09/13/2015   Carpal tunnel syndrome, right 09/13/2015   Tarsal tunnel syndrome of right side 09/13/2015   Varicose veins of both lower extremities without ulcer or inflammation 09/23/2014   Asthma, mild intermittent 08/15/2014   Benign hypertension 08/15/2014   Herpes genitalis in women 08/15/2014   Allergic rhinitis 08/15/2014   Cerebral seizure (HCC) 08/15/2014   Vitamin D deficiency 08/15/2014   Morbid obesity (HCC) 08/03/2014   Adhesive capsulitis of left shoulder 06/16/2013   Obstructive sleep apnea of adult 06/16/2013   Epilepsy (HCC) 06/16/2013   Anemia, iron deficiency 09/30/2006    Past Surgical History:  Procedure Laterality Date   BREAST EXCISIONAL BIOPSY Left    age 76 tumor removed   BREAST SURGERY Left    benign lump at age 73    Family History  Problem Relation Age of Onset   Cancer Mother        Breast   Hypertension Mother    Diabetes Mother    Hyperlipidemia Mother    Breast cancer Mother     Social History   Tobacco Use   Smoking status: Never   Smokeless  tobacco: Never  Substance Use Topics   Alcohol use: No    Alcohol/week: 0.0 standard drinks of alcohol     Current Outpatient Medications:    albuterol (VENTOLIN HFA) 108 (90 Base) MCG/ACT inhaler, Inhale 2 puffs into the lungs every 6 (six) hours as needed., Disp: 1 each, Rfl: 0   amLODipine (NORVASC) 5 MG tablet, Take 1 tablet (5 mg total) by mouth daily., Disp: 90 tablet, Rfl: 1   budesonide-formoterol (SYMBICORT) 160-4.5 MCG/ACT inhaler, Inhale 2 puffs into the lungs 2 (two) times daily., Disp: 1 each, Rfl: 2   carbamazepine (TEGRETOL) 200 MG tablet, TAKE 1 AND 1/2 TABLETS BY  MOUTH IN THE MORNING AND  NOON AND 2 AND 1/2 TABLETS  BY MOUTH AT NIGHT, Disp: , Rfl:    cetirizine (ZYRTEC) 10 MG tablet, Take 1 tablet (10 mg total) by mouth daily., Disp: 30 tablet, Rfl: 1   fluticasone (FLONASE) 50  MCG/ACT nasal spray, Place 2 sprays into both nostrils daily., Disp: 48 g, Rfl: 1   levETIRAcetam (KEPPRA) 500 MG tablet, Take 3 tablets by mouth 2 (two) times daily., Disp: , Rfl:    PREVIDENT 5000 BOOSTER PLUS 1.1 % PSTE, , Disp: , Rfl:    telmisartan-hydrochlorothiazide (MICARDIS HCT) 80-12.5 MG tablet, Take 1 tablet by mouth daily., Disp: 90 tablet, Rfl: 1   topiramate (TOPAMAX) 100 MG tablet, Take 3 tablets by mouth 3 (three) times daily., Disp: , Rfl:    valACYclovir (VALTREX) 1000 MG tablet, Take 1 tablet (1,000 mg total) by mouth 2 (two) times daily., Disp: 20 tablet, Rfl: 0   Vitamin D, Ergocalciferol, (DRISDOL) 1.25 MG (50000 UNIT) CAPS capsule, Take 1 capsule (50,000 Units total) by mouth every 7 (seven) days., Disp: 12 capsule, Rfl: 1  Allergies  Allergen Reactions   Ace Inhibitors     I personally reviewed active problem list, medication list, allergies, family history, social history, health maintenance with the patient/caregiver today.   ROS  Constitutional: Negative for fever or significant weight change.  Respiratory: Negative for cough and shortness of breath.   Cardiovascular: Negative for chest pain or palpitations.  Gastrointestinal: Negative for abdominal pain, no bowel changes.  Musculoskeletal: Negative for gait problem or joint swelling.  Skin: Negative for rash.  Neurological: Negative for dizziness or headache.  No other specific complaints in a complete review of systems (except as listed in HPI above).   Objective  Vitals:   09/24/21 1520  BP: 134/86  Pulse: 91  Resp: 16  SpO2: 99%  Weight: 293 lb (132.9 kg)  Height: 5\' 4"  (1.626 m)    Body mass index is 50.29 kg/m.  Physical Exam  Constitutional: Patient appears well-developed and well-nourished. Obese  No distress.  HEENT: head atraumatic, normocephalic, pupils equal and reactive to light, neck supple Cardiovascular: Normal rate, regular rhythm and normal heart sounds.  No murmur heard. No  BLE edema. Pulmonary/Chest: Effort normal and breath sounds normal. No respiratory distress. Abdominal: Soft.  There is no tenderness. Psychiatric: Patient has a normal mood and affect. behavior is normal. Judgment and thought content normal.   PHQ2/9:    09/24/2021    3:20 PM 06/14/2021    9:45 AM 05/21/2021    3:57 PM 11/20/2020    3:11 PM 07/05/2020    9:07 AM  Depression screen PHQ 2/9  Decreased Interest 0 0 0 0 0  Down, Depressed, Hopeless 0 0 0 0 0  PHQ - 2 Score 0 0 0 0 0  Altered sleeping  0 0    Tired, decreased energy  0 0    Change in appetite  0 0    Feeling bad or failure about yourself   0 0    Trouble concentrating  0 0    Moving slowly or fidgety/restless  0 0    Suicidal thoughts  0 0    PHQ-9 Score  0 0    Difficult doing work/chores  Not difficult at all       phq 9 is negative   Fall Risk:    09/24/2021    3:20 PM 06/14/2021    9:45 AM 05/21/2021    3:56 PM 11/20/2020    3:11 PM 07/05/2020    9:06 AM  Fall Risk   Falls in the past year? 0 0 0 0 0  Number falls in past yr: 0 0 0 0 0  Injury with Fall? 0 0 0 0 0  Risk for fall due to : No Fall Risks  No Fall Risks No Fall Risks   Follow up Falls prevention discussed  Falls prevention discussed Falls prevention discussed       Functional Status Survey: Is the patient deaf or have difficulty hearing?: No Does the patient have difficulty seeing, even when wearing glasses/contacts?: No Does the patient have difficulty concentrating, remembering, or making decisions?: No Does the patient have difficulty walking or climbing stairs?: Yes Does the patient have difficulty dressing or bathing?: No Does the patient have difficulty doing errands alone such as visiting a doctor's office or shopping?: No    Assessment & Plan  1. Seizure [facility])  Gave her [facility] neuro number for her to call and schedule appointment today   2. Morbid obesity (HCC)  Discussed with the patient the risk posed by an increased BMI.  Discussed importance of portion control, calorie counting and at least 150 minutes of physical activity weekly. Avoid sweet beverages and drink more water. Eat at least 6 servings of fruit and vegetables daily    3. B12 deficiency   4. Vitamin D deficiency   5. Benign hypertension  At goal   6. Pure hypercholesterolemia   7. Obstructive sleep apnea of adult   8. GERD without esophagitis   9. Pre-diabetes   10. Mild intermittent asthma without complication  - budesonide-formoterol (SYMBICORT) 160-4.5 MCG/ACT inhaler; Inhale 2 puffs into the lungs 2 (two) times daily.  Dispense: 1 each; Refill: 2  11. Iron deficiency anemia, unspecified iron deficiency anemia type   12. Need for Tdap vaccination  - Tdap vaccine greater than or equal to 7yo IM  13. Need for shingles vaccine  She wants to hold off until next visit

## 2021-09-24 ENCOUNTER — Ambulatory Visit (INDEPENDENT_AMBULATORY_CARE_PROVIDER_SITE_OTHER): Payer: 59 | Admitting: Family Medicine

## 2021-09-24 ENCOUNTER — Encounter: Payer: Self-pay | Admitting: Family Medicine

## 2021-09-24 VITALS — BP 134/86 | HR 91 | Resp 16 | Ht 64.0 in | Wt 293.0 lb

## 2021-09-24 DIAGNOSIS — E538 Deficiency of other specified B group vitamins: Secondary | ICD-10-CM | POA: Diagnosis not present

## 2021-09-24 DIAGNOSIS — I10 Essential (primary) hypertension: Secondary | ICD-10-CM

## 2021-09-24 DIAGNOSIS — J4520 Mild intermittent asthma, uncomplicated: Secondary | ICD-10-CM

## 2021-09-24 DIAGNOSIS — E7800 Pure hypercholesterolemia, unspecified: Secondary | ICD-10-CM

## 2021-09-24 DIAGNOSIS — G4733 Obstructive sleep apnea (adult) (pediatric): Secondary | ICD-10-CM

## 2021-09-24 DIAGNOSIS — Z23 Encounter for immunization: Secondary | ICD-10-CM

## 2021-09-24 DIAGNOSIS — K219 Gastro-esophageal reflux disease without esophagitis: Secondary | ICD-10-CM

## 2021-09-24 DIAGNOSIS — E559 Vitamin D deficiency, unspecified: Secondary | ICD-10-CM

## 2021-09-24 DIAGNOSIS — R569 Unspecified convulsions: Secondary | ICD-10-CM

## 2021-09-24 DIAGNOSIS — R7303 Prediabetes: Secondary | ICD-10-CM

## 2021-09-24 DIAGNOSIS — D509 Iron deficiency anemia, unspecified: Secondary | ICD-10-CM

## 2021-09-24 MED ORDER — BUDESONIDE-FORMOTEROL FUMARATE 160-4.5 MCG/ACT IN AERO: 2.0000 | INHALATION_SPRAY | Freq: Two times a day (BID) | RESPIRATORY_TRACT | 2 refills | Status: DC

## 2021-09-26 ENCOUNTER — Other Ambulatory Visit: Payer: Self-pay | Admitting: Family Medicine

## 2021-09-27 LAB — CBC WITH DIFFERENTIAL/PLATELET
Basophils Absolute: 0 10*3/uL (ref 0.0–0.2)
Basos: 1 %
EOS (ABSOLUTE): 0.1 10*3/uL (ref 0.0–0.4)
Eos: 2 %
Hematocrit: 33 % — ABNORMAL LOW (ref 34.0–46.6)
Hemoglobin: 10.6 g/dL — ABNORMAL LOW (ref 11.1–15.9)
Immature Grans (Abs): 0 10*3/uL (ref 0.0–0.1)
Immature Granulocytes: 0 %
Lymphocytes Absolute: 1 10*3/uL (ref 0.7–3.1)
Lymphs: 25 %
MCH: 26.6 pg (ref 26.6–33.0)
MCHC: 32.1 g/dL (ref 31.5–35.7)
MCV: 83 fL (ref 79–97)
Monocytes Absolute: 0.3 10*3/uL (ref 0.1–0.9)
Monocytes: 8 %
Neutrophils Absolute: 2.6 10*3/uL (ref 1.4–7.0)
Neutrophils: 64 %
Platelets: 225 10*3/uL (ref 150–450)
RBC: 3.99 x10E6/uL (ref 3.77–5.28)
RDW: 14.2 % (ref 11.7–15.4)
WBC: 4 10*3/uL (ref 3.4–10.8)

## 2021-09-27 LAB — COMPREHENSIVE METABOLIC PANEL
ALT: 12 IU/L (ref 0–32)
AST: 12 IU/L (ref 0–40)
Albumin/Globulin Ratio: 1.8 (ref 1.2–2.2)
Albumin: 5 g/dL — ABNORMAL HIGH (ref 3.8–4.9)
Alkaline Phosphatase: 129 IU/L — ABNORMAL HIGH (ref 44–121)
BUN/Creatinine Ratio: 15 (ref 9–23)
BUN: 14 mg/dL (ref 6–24)
Bilirubin Total: <0.2 mg/dL (ref 0.0–1.2)
CO2: 23 mmol/L (ref 20–29)
Calcium: 9.5 mg/dL (ref 8.7–10.2)
Chloride: 101 mmol/L (ref 96–106)
Creatinine, Ser: 0.92 mg/dL (ref 0.57–1.00)
Globulin, Total: 2.8 g/dL (ref 1.5–4.5)
Glucose: 93 mg/dL (ref 70–99)
Potassium: 3.8 mmol/L (ref 3.5–5.2)
Sodium: 139 mmol/L (ref 134–144)
Total Protein: 7.8 g/dL (ref 6.0–8.5)
eGFR: 72 mL/min/{1.73_m2} (ref >59)

## 2021-09-27 LAB — LIPID PANEL
Chol/HDL Ratio: 2.6 ratio (ref 0.0–4.4)
Cholesterol, Total: 260 mg/dL — ABNORMAL HIGH (ref 100–199)
HDL: 99 mg/dL (ref >39)
LDL Chol Calc (NIH): 143 mg/dL — ABNORMAL HIGH (ref 0–99)
Triglycerides: 109 mg/dL (ref 0–149)
VLDL Cholesterol Cal: 18 mg/dL (ref 5–40)

## 2021-09-27 LAB — VITAMIN B12: Vitamin B-12: 359 pg/mL (ref 232–1245)

## 2021-09-27 LAB — CARBAMAZEPINE LEVEL, TOTAL: Carbamazepine (Tegretol), S: 12.7 ug/mL (ref 4.0–12.0)

## 2021-09-27 LAB — HEMOGLOBIN A1C
Est. average glucose Bld gHb Est-mCnc: 120 mg/dL
Hgb A1c MFr Bld: 5.8 % — ABNORMAL HIGH (ref 4.8–5.6)

## 2021-09-27 LAB — IRON,TIBC AND FERRITIN PANEL
Ferritin: 87 ng/mL (ref 15–150)
Iron Saturation: 15 % (ref 15–55)
Iron: 35 ug/dL (ref 27–159)
Total Iron Binding Capacity: 237 ug/dL — ABNORMAL LOW (ref 250–450)
UIBC: 202 ug/dL (ref 131–425)

## 2021-09-27 LAB — VITAMIN D 25 HYDROXY (VIT D DEFICIENCY, FRACTURES): Vit D, 25-Hydroxy: 14.1 ng/mL — ABNORMAL LOW (ref 30.0–100.0)

## 2021-09-27 LAB — LEVETIRACETAM LEVEL: Levetiracetam Lvl: 37.5 ug/mL (ref 10.0–40.0)

## 2021-10-08 ENCOUNTER — Other Ambulatory Visit: Payer: Self-pay | Admitting: Family Medicine

## 2021-10-08 ENCOUNTER — Telehealth: Payer: Self-pay | Admitting: Family Medicine

## 2021-10-08 MED ORDER — VALSARTAN-HYDROCHLOROTHIAZIDE 160-12.5 MG PO TABS: 1.0000 | ORAL_TABLET | Freq: Every day | ORAL | 1 refills | Status: DC

## 2021-10-08 NOTE — Telephone Encounter (Unsigned)
Copied from CRM [id number]. Topic: General - Other >> Oct 08, 2021 12:44 PM Everette C wrote: Reason for CRM: The patient has called to share that their telmisartan-hydrochlorothiazide (MICARDIS HCT) 80-12.5 MG tablet [[id number]] will not be covered by their insurance   The out of pocket cost will be $400 the patient has been directed by their pharmacy to contact their Pcp and request an alternative prescription   The patient has 1 tablet remaining and stresses the urgency of their request

## 2021-11-21 ENCOUNTER — Other Ambulatory Visit: Payer: Self-pay | Admitting: Family Medicine

## 2021-11-21 DIAGNOSIS — I10 Essential (primary) hypertension: Secondary | ICD-10-CM

## 2021-12-21 NOTE — Progress Notes (Deleted)
Name: Paula Paula [name]   MRN: [id number]    DOB: 08/30/1960   Date:12/21/2021       Progress Note  Subjective  Chief Complaint  Follow Up  HPI  HTN: she is compliant with her bp medication, taking Telmisartan hctz and Amlodipine. She denies dizziness, or chest tightness or palpitation   Hyperlipidemia:    The 10-year ASCVD risk score (Arnett DK, et al., 2019) is: 6.3%   Values used to calculate the score:     Age: 61 years     Sex: Female     Is Non-Hispanic African American: Yes     Diabetic: Paula [name]     Tobacco smoker: Paula [name]     Systolic Blood Pressure: 450 mmHg     Is BP treated: Yes     HDL Cholesterol: 85 mg/dL     Total Cholesterol: 230 mg/dL    Seizure disorder: she is still under the care of Paula Paula [name] neurologist, last visit 04/2020  Paula [name] recent seizure, able to drive, last levels normal. She is compliant with medications but has not been back for follow , explained how important it is to re-schedule it . I also reminded her to get labs done. We ordered back in March 2023   Asthma mild: she states currently Paula [name] problems, she states she has been using Symbicort prn , she states currently doing well    OSA: mild, she used to wear CPAP, lost weight and stopped, but even though she has gained weight she does not want to resume CPAP machine .  She denies morning headaches however  she wakes up with dry mouth.Neurologist saw her in 04/2020 , she is supposed to repeat sleep study but not ready to do it. Unchanged    Morbid Obesity:she has lost 5 lbs since last visit, she states eating smaller portions   Iron deficiency anemia and leucopenia: she used to see Paula Paula [name] but was released, advised to go back if Hemoglobin drops below 10 and iron storages also drops. 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    Pre-diabetes 01/01/2017   Sinus tarsi syndrome of right foot 06/17/2016   Plantar fasciitis, right 06/17/2016   Osteoarthritis of right foot 06/17/2016   Primary osteoarthritis, right ankle and foot 09/13/2015   Carpal tunnel syndrome, right 09/13/2015   Tarsal tunnel syndrome of right side 09/13/2015   Varicose veins of both lower extremities without ulcer or inflammation 09/23/2014   Mild intermittent asthma without complication 38/88/2800   Benign hypertension 08/15/2014   Herpes genitalis in women 08/15/2014   Allergic rhinitis 08/15/2014   Seizure ([place]) 08/15/2014   Vitamin D deficiency 08/15/2014   Morbid obesity ([place]) 08/03/2014   Adhesive capsulitis of left shoulder 06/16/2013   Obstructive sleep apnea of adult 06/16/2013   Epilepsy ([place]) 06/16/2013   Anemia, iron deficiency 09/30/2006    Past Surgical History:  Procedure Laterality Date   BREAST EXCISIONAL BIOPSY Left    age 54 tumor removed   BREAST SURGERY Left    benign lump at age 8    Family History  Problem Relation Age of Onset   Cancer Mother        Breast   Hypertension Mother    Diabetes Mother    Hyperlipidemia Mother    Breast cancer Mother  Social History   Tobacco Use   Smoking status: Never   Smokeless tobacco: Never  Substance Use Topics   Alcohol use: Paula [name]    Alcohol/week: 0.0 standard drinks of alcohol     Current Outpatient Medications:    albuterol (VENTOLIN HFA) 108 (90 Base) MCG/ACT inhaler, Inhale 2 puffs into the lungs every 6 (six) hours as needed., Disp: 1 each, Rfl: 0   amLODipine (NORVASC) 5 MG tablet, TAKE 1 TABLET(5 MG) BY MOUTH DAILY, Disp: 90 tablet, Rfl: 0   budesonide-formoterol (SYMBICORT) 160-4.5 MCG/ACT inhaler, Inhale 2 puffs into the lungs 2 (two) times daily., Disp: 1 each, Rfl: 2   carbamazepine (TEGRETOL) 200 MG tablet, TAKE 1 AND 1/2 TABLETS BY  MOUTH IN THE MORNING AND  NOON AND 2 AND 1/2 TABLETS  BY MOUTH AT NIGHT, Disp: , Rfl:    cetirizine (ZYRTEC) 10 MG  tablet, Take 1 tablet (10 mg total) by mouth daily., Disp: 30 tablet, Rfl: 1   fluticasone (FLONASE) 50 MCG/ACT nasal spray, Place 2 sprays into both nostrils daily., Disp: 48 g, Rfl: 1   levETIRAcetam (KEPPRA) 500 MG tablet, Take 3 tablets by mouth 2 (two) times daily., Disp: , Rfl:    PREVIDENT 5000 BOOSTER PLUS 1.1 % PSTE, , Disp: , Rfl:    topiramate (TOPAMAX) 100 MG tablet, Take 3 tablets by mouth 3 (three) times daily., Disp: , Rfl:    valACYclovir (VALTREX) 1000 MG tablet, Take 1 tablet (1,000 mg total) by mouth 2 (two) times daily., Disp: 20 tablet, Rfl: 0   valsartan-hydrochlorothiazide (DIOVAN-HCT) 160-12.5 MG tablet, Take 1 tablet by mouth daily. In place of telmisartan hctz, Disp: 90 tablet, Rfl: 1   Vitamin D, Ergocalciferol, (DRISDOL) 1.25 MG (50000 UNIT) CAPS capsule, Take 1 capsule (50,000 Units total) by mouth every 7 (seven) days., Disp: 12 capsule, Rfl: 1  Allergies  Allergen Reactions   Ace Inhibitors     I personally reviewed active problem list, medication list, allergies, family history, social history, health maintenance with the patient/caregiver today.   ROS  ***  Objective  There were Paula [name] vitals filed for this visit.  There is Paula [name] height or weight on file to calculate BMI.  Physical Exam ***  Recent Results (from the past 2160 hour(s))  Iron, TIBC and Ferritin Panel     Status: Abnormal   Collection Time: 09/26/21  2:27 PM  Result Value Ref Range   Total Iron Binding Capacity 237 (L) 250 - 450 ug/dL   UIBC 202 131 - 425 ug/dL   Iron 35 27 - 159 ug/dL   Iron Saturation 15 15 - 55 %   Ferritin 87 15 - 150 ng/mL  CBC with Differential/Platelet     Status: Abnormal   Collection Time: 09/26/21  2:27 PM  Result Value Ref Range   WBC 4.0 3.4 - 10.8 x10E3/uL   RBC 3.99 3.77 - 5.28 x10E6/uL   Hemoglobin 10.6 (L) 11.1 - 15.9 g/dL   Hematocrit 33.0 (L) 34.0 - 46.6 %   MCV 83 79 - 97 fL   MCH 26.6 26.6 - 33.0 pg   MCHC 32.1 31.5 - 35.7 g/dL   RDW 14.2  11.7 - 15.4 %   Platelets 225 150 - 450 x10E3/uL   Neutrophils 64 Not Estab. %   Lymphs 25 Not Estab. %   Monocytes 8 Not Estab. %   Eos 2 Not Estab. %   Basos 1 Not Estab. %   Neutrophils Absolute 2.6 1.4 - 7.0  x10E3/uL   Lymphocytes Absolute 1.0 0.7 - 3.1 x10E3/uL   Monocytes Absolute 0.3 0.1 - 0.9 x10E3/uL   EOS (ABSOLUTE) 0.1 0.0 - 0.4 x10E3/uL   Basophils Absolute 0.0 0.0 - 0.2 x10E3/uL   Immature Granulocytes 0 Not Estab. %   Immature Grans (Abs) 0.0 0.0 - 0.1 x10E3/uL  Comprehensive metabolic panel     Status: Abnormal   Collection Time: 09/26/21  2:27 PM  Result Value Ref Range   Glucose 93 70 - 99 mg/dL   BUN 14 6 - 24 mg/dL   Creatinine, Ser 0.92 0.57 - 1.00 mg/dL   eGFR 72 >59 mL/min/1.73   BUN/Creatinine Ratio 15 9 - 23   Sodium 139 134 - 144 mmol/L   Potassium 3.8 3.5 - 5.2 mmol/L   Chloride 101 96 - 106 mmol/L   CO2 23 20 - 29 mmol/L   Calcium 9.5 8.7 - 10.2 mg/dL   Total Protein 7.8 6.0 - 8.5 g/dL   Albumin 5.0 (H) 3.8 - 4.9 g/dL    Comment:               **Please note reference interval change**   Globulin, Total 2.8 1.5 - 4.5 g/dL   Albumin/Globulin Ratio 1.8 1.2 - 2.2   Bilirubin Total <0.2 0.0 - 1.2 mg/dL   Alkaline Phosphatase 129 (H) 44 - 121 IU/L   AST 12 0 - 40 IU/L   ALT 12 0 - 32 IU/L  Lipid panel     Status: Abnormal   Collection Time: 09/26/21  2:27 PM  Result Value Ref Range   Cholesterol, Total 260 (H) 100 - 199 mg/dL   Triglycerides 109 0 - 149 mg/dL   HDL 99 >39 mg/dL   VLDL Cholesterol Cal 18 5 - 40 mg/dL   LDL Chol Calc (NIH) 143 (H) 0 - 99 mg/dL   Chol/HDL Ratio 2.6 0.0 - 4.4 ratio    Comment:                                   T. Chol/HDL Ratio                                             Men  Women                               1/2 Avg.Risk  3.4    3.3                                   Avg.Risk  5.0    4.4                                2X Avg.Risk  9.6    7.1                                3X Avg.Risk 23.4   11.0   Hemoglobin A1c      Status: Abnormal   Collection Time: 09/26/21  2:27 PM  Result Value Ref Range   Hgb A1c MFr Bld  5.8 (H) 4.8 - 5.6 %    Comment:          Prediabetes: 5.7 - 6.4          Diabetes: >6.4          Glycemic control for adults with diabetes: <7.0    Est. average glucose Bld gHb Est-mCnc 120 mg/dL  Carbamazepine level, total     Status: Abnormal   Collection Time: 09/26/21  2:27 PM  Result Value Ref Range   Carbamazepine (Tegretol), S 12.7 (HH) 4.0 - 12.0 ug/mL    Comment:          In conjunction with other antiepileptic drugs                                Therapeutic  4.0 -  8.0                                Toxicity     9.0 - 12.0                                    Carbamazepine alone                                Therapeutic  8.0 - 12.0                                 Detection Limit =  2.0                           <2.0 indicates None Detected Patient drug level exceeds published reference range.  Evaluate clinically for signs of potential toxicity.   VITAMIN D 25 Hydroxy (Vit-D Deficiency, Fractures)     Status: Abnormal   Collection Time: 09/26/21  2:27 PM  Result Value Ref Range   Vit D, 25-Hydroxy 14.1 (L) 30.0 - 100.0 ng/mL    Comment: Vitamin D deficiency has been defined by the [place] practice guideline as a level of serum 25-OH vitamin D less than 20 ng/mL (1,2). The Endocrine Society went on to further define vitamin D insufficiency as a level between 21 and 29 ng/mL (2). 1. IOM (Institute of Medicine). 2010. Dietary reference    intakes for calcium and D. [place]: The    [employer]. 2. Holick MF, Binkley [place], Bischoff-Ferrari HA, et al.    Evaluation, treatment, and prevention of vitamin D    deficiency: an Endocrine Society clinical practice    guideline. JCEM. 2011 Jul; 96(7):1911-30.   Levetiracetam level     Status: None   Collection Time: 09/26/21  2:27 PM  Result Value Ref Range   Levetiracetam  Lvl 37.5 10.0 - 40.0 ug/mL  Vitamin B12     Status: None   Collection Time: 09/26/21  2:27 PM  Result Value Ref Range   Vitamin B-12 359 232 - 1,245 pg/mL    PHQ2/9:    09/24/2021    3:20 PM 06/14/2021    9:45 AM 05/21/2021    3:57 PM 11/20/2020    3:11 PM 07/05/2020    9:07 AM  Depression screen PHQ 2/9  Decreased Interest 0 0 0 0 0  Down, Depressed, Hopeless 0 0 0 0 0  PHQ - 2 Score 0 0 0 0 0  Altered sleeping  0 0    Tired, decreased energy  0 0    Change in appetite  0 0    Feeling bad or failure about yourself   0 0    Trouble concentrating  0 0    Moving slowly or fidgety/restless  0 0    Suicidal thoughts  0 0    PHQ-9 Score  0 0    Difficult doing work/chores  Not difficult at all       phq 9 is {gen pos [id number]}   Fall Risk:    09/24/2021    3:20 PM 06/14/2021    9:45 AM 05/21/2021    3:56 PM 11/20/2020    3:11 PM 07/05/2020    9:06 AM  Fall Risk   Falls in the past year? 0 0 0 0 0  Number falls in past yr: 0 0 0 0 0  Injury with Fall? 0 0 0 0 0  Risk for fall due to : Paula [name] Fall Risks  Paula [name] Fall Risks Paula [name] Fall Risks   Follow up Falls prevention discussed  Falls prevention discussed Falls prevention discussed       Functional Status Survey:      Assessment & Plan  *** There are Paula [name] diagnoses linked to this encounter.

## 2021-12-24 ENCOUNTER — Ambulatory Visit: Payer: 59 | Admitting: Family Medicine

## 2021-12-24 DIAGNOSIS — Z1231 Encounter for screening mammogram for malignant neoplasm of breast: Secondary | ICD-10-CM

## 2022-01-30 ENCOUNTER — Telehealth: Payer: Self-pay | Admitting: Family Medicine

## 2022-01-30 ENCOUNTER — Ambulatory Visit: Payer: Self-pay | Admitting: *Deleted

## 2022-01-30 NOTE — Telephone Encounter (Signed)
Called patient lvm to give her a virtual appt with Danelle Berry per S[facility] .

## 2022-01-30 NOTE — Telephone Encounter (Signed)
  Chief Complaint: cough Symptoms: cough, sinus drainage Frequency: 1 week Pertinent Negatives: Patient denies fever, SOB Disposition: [] ED /[] Urgent Care (no appt availability in office) / [] Appointment(In office/virtual)/ []  [facility]/ [] Home Care/ [] Refused Recommended Disposition /[] [facility]/ [x]  Follow-up with PCP Additional Notes: Patient has appointment  Monday- advised UC if she gets worse before that appointment. Symptom care given - patient request something to help with cough. Patient wants cough syrup if possible call in before appointment on Monday. [facility] 

## 2022-01-30 NOTE — Telephone Encounter (Signed)
Summary: cough   Pt has had a cough and wanted an appt this week / no appt available this week / she scheduled appt for Monday but would like to check with the nurse / call her at home until 9am and then call her work number [phone]/ please advise        Congestion- nasal drainage Reason for Disposition  [1] Continuous (nonstop) coughing interferes with work or school AND [2] no improvement using cough treatment per Care Advice  Answer Assessment - Initial Assessment Questions 1. ONSET: "When did the cough begin?"      1 week 2. SEVERITY: "How bad is the cough today?"      Cough worse at night- not as bad during the day 3. SPUTUM: "Describe the color of your sputum" (none, dry cough; clear, white, yellow, green)     yellowish 4. HEMOPTYSIS: "Are you coughing up any blood?" If so ask: "How much?" (flecks, streaks, tablespoons, etc.)     Sometimes- flecks 5. DIFFICULTY BREATHING: "Are you having difficulty breathing?" If Yes, ask: "How bad is it?" (e.g., mild, moderate, severe)    - MILD: No SOB at rest, mild SOB with walking, speaks normally in sentences, can lie down, no retractions, pulse < 100.    - MODERATE: SOB at rest, SOB with minimal exertion and prefers to sit, cannot lie down flat, speaks in phrases, mild retractions, audible wheezing, pulse 100-120.    - SEVERE: Very SOB at rest, speaks in single words, struggling to breathe, sitting hunched forward, retractions, pulse > 120      no 6. FEVER: "Do you have a fever?" If Yes, ask: "What is your temperature, how was it measured, and when did it start?"     no 7. CARDIAC HISTORY: "Do you have any history of heart disease?" (e.g., heart attack, congestive heart failure)      no 8. LUNG HISTORY: "Do you have any history of lung disease?"  (e.g., pulmonary embolus, asthma, emphysema)     COPD, asthmatic bronchitis 9. PE RISK FACTORS: "Do you have a history of blood clots?" (or: recent major surgery, recent prolonged travel,  bedridden)     no 10. OTHER SYMPTOMS: "Do you have any other symptoms?" (e.g., runny nose, wheezing, chest pain)      nasal drainage 11. PREGNANCY: "Is there any chance you are pregnant?" "When was your last menstrual period?"       na 12. TRAVEL: "Have you traveled out of the country in the last month?" (e.g., travel history, exposures)       na  Protocols used: Cough - Acute Productive-A-AH

## 2022-02-04 ENCOUNTER — Ambulatory Visit: Payer: 59 | Admitting: Family Medicine

## 2022-02-04 ENCOUNTER — Encounter: Payer: Self-pay | Admitting: Family Medicine

## 2022-02-04 VITALS — BP 130/82 | HR 90 | Temp 98.2°F | Resp 16 | Ht 64.0 in | Wt 287.6 lb

## 2022-02-04 DIAGNOSIS — Z1231 Encounter for screening mammogram for malignant neoplasm of breast: Secondary | ICD-10-CM | POA: Diagnosis not present

## 2022-02-04 DIAGNOSIS — J0110 Acute frontal sinusitis, unspecified: Secondary | ICD-10-CM | POA: Diagnosis not present

## 2022-02-04 DIAGNOSIS — J3089 Other allergic rhinitis: Secondary | ICD-10-CM | POA: Diagnosis not present

## 2022-02-04 MED ORDER — AMOXICILLIN-POT CLAVULANATE 875-125 MG PO TABS: 1.0000 | ORAL_TABLET | Freq: Two times a day (BID) | ORAL | 0 refills | Status: DC

## 2022-02-04 MED ORDER — FLUTICASONE PROPIONATE 50 MCG/ACT NA SUSP: 2.0000 | Freq: Every day | NASAL | 1 refills | Status: AC

## 2022-02-04 NOTE — Progress Notes (Signed)
Name: Paula [name]   MRN: [id number]    DOB: 1960/08/03   Date:02/04/2022       Progress Note  Subjective  Chief Complaint  Acute follow up   HPI  Cough: started two weeks ago with rhinorrhea, facial pressure  and a cough. The cough was initially dry but now is wet. She denies fever or chills. The congestion is now in her chest, no SOB or wheezing. She states symptoms worse at night. 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   Pre-diabetes 01/01/2017   Sinus tarsi syndrome of right foot 06/17/2016   Plantar fasciitis, right 06/17/2016   Osteoarthritis of right foot 06/17/2016   Primary osteoarthritis, right ankle and foot 09/13/2015   Carpal tunnel syndrome, right 09/13/2015   Tarsal tunnel syndrome of right side 09/13/2015   Varicose veins of both lower extremities without ulcer or inflammation 09/23/2014   Mild intermittent asthma without complication 08/15/2014   Benign hypertension 08/15/2014   Herpes genitalis in women 08/15/2014   Allergic rhinitis 08/15/2014   Seizure ([facility]) 08/15/2014   Vitamin D deficiency 08/15/2014   Morbid obesity ([facility]) 08/03/2014   Adhesive capsulitis of left shoulder 06/16/2013   Obstructive sleep apnea of adult 06/16/2013   Epilepsy ([facility]) 06/16/2013   Anemia, iron deficiency 09/30/2006    Past Surgical History:  Procedure Laterality Date   BREAST EXCISIONAL BIOPSY Left    age 36 tumor removed   BREAST SURGERY Left    benign lump at age 43    Family History  Problem Relation Age of Onset   Cancer Mother        Breast   Hypertension Mother    Diabetes Mother    Hyperlipidemia Mother    Breast cancer Mother     Social History   Tobacco Use   Smoking status: Never   Smokeless tobacco: Never  Substance Use Topics   Alcohol use: No    Alcohol/week:  0.0 standard drinks of alcohol     Current Outpatient Medications:    albuterol (VENTOLIN HFA) 108 (90 Base) MCG/ACT inhaler, Inhale 2 puffs into the lungs every 6 (six) hours as needed., Disp: 1 each, Rfl: 0   amLODipine (NORVASC) 5 MG tablet, TAKE 1 TABLET(5 MG) BY MOUTH DAILY, Disp: 90 tablet, Rfl: 0   budesonide-formoterol (SYMBICORT) 160-4.5 MCG/ACT inhaler, Inhale 2 puffs into the lungs 2 (two) times daily., Disp: 1 each, Rfl: 2   carbamazepine (TEGRETOL) 200 MG tablet, TAKE 1 AND 1/2 TABLETS BY  MOUTH IN THE MORNING AND  NOON AND 2 AND 1/2 TABLETS  BY MOUTH AT NIGHT, Disp: , Rfl:    cetirizine (ZYRTEC) 10 MG tablet, Take 1 tablet (10 mg total) by mouth daily., Disp: 30 tablet, Rfl: 1   fluticasone (FLONASE) 50 MCG/ACT nasal spray, Place 2 sprays into both nostrils daily., Disp: 48 g, Rfl: 1   levETIRAcetam (KEPPRA) 500 MG tablet, Take 3 tablets by mouth 2 (two) times daily., Disp: , Rfl:    PREVIDENT 5000 BOOSTER PLUS 1.1 % PSTE, , Disp: , Rfl:    topiramate (TOPAMAX) 100 MG tablet, Take 3 tablets by mouth 3 (three) times daily., Disp: , Rfl:    valACYclovir (VALTREX) 1000 MG tablet, Take 1 tablet (1,000 mg total) by mouth 2 (two) times daily., Disp: 20 tablet, Rfl: 0  valsartan-hydrochlorothiazide (DIOVAN-HCT) 160-12.5 MG tablet, Take 1 tablet by mouth daily. In place of telmisartan hctz, Disp: 90 tablet, Rfl: 1   Vitamin D, Ergocalciferol, (DRISDOL) 1.25 MG (50000 UNIT) CAPS capsule, Take 1 capsule (50,000 Units total) by mouth every 7 (seven) days., Disp: 12 capsule, Rfl: 1  Allergies  Allergen Reactions   Ace Inhibitors     I personally reviewed active problem list, medication list, allergies, family history, social history with the patient/caregiver today.   ROS  Ten systems reviewed and is negative except as mentioned in HPI   Objective  Vitals:   02/04/22 0918  BP: 130/82  Pulse: 90  Resp: 16  Temp: 98.2 F (36.8 C)  TempSrc: Oral  SpO2: 100%  Weight: 287 lb  9.6 oz (130.5 kg)  Height: 5\' 4"  (1.626 m)    Body mass index is 49.37 kg/m.  Physical Exam  Constitutional: Patient appears well-developed and well-nourished. Obese  No distress.  HEENT: head atraumatic, normocephalic, pupils equal and reactive to light, ears normal TM, neck supple, throat within normal limits, tender frontal sinus during percussion  Cardiovascular: Normal rate, regular rhythm and normal heart sounds.  No murmur heard. No BLE edema. Pulmonary/Chest: Effort normal and breath sounds normal. No respiratory distress. Abdominal: Soft.  There is no tenderness. Psychiatric: Patient has a normal mood and affect. behavior is normal. Judgment and thought content normal.     PHQ2/9:    02/04/2022    9:20 AM 09/24/2021    3:20 PM 06/14/2021    9:45 AM 05/21/2021    3:57 PM 11/20/2020    3:11 PM  Depression screen PHQ 2/9  Decreased Interest 0 0 0 0 0  Down, Depressed, Hopeless 0 0 0 0 0  PHQ - 2 Score 0 0 0 0 0  Altered sleeping 0  0 0   Tired, decreased energy 0  0 0   Change in appetite 0  0 0   Feeling bad or failure about yourself  0  0 0   Trouble concentrating 0  0 0   Moving slowly or fidgety/restless 0  0 0   Suicidal thoughts 0  0 0   PHQ-9 Score 0  0 0   Difficult doing work/chores   Not difficult at all      phq 9 is negative   Fall Risk:    02/04/2022    9:19 AM 09/24/2021    3:20 PM 06/14/2021    9:45 AM 05/21/2021    3:56 PM 11/20/2020    3:11 PM  Fall Risk   Falls in the past year? 0 0 0 0 0  Number falls in past yr:  0 0 0 0  Injury with Fall?  0 0 0 0  Risk for fall due to : No Fall Risks No Fall Risks  No Fall Risks No Fall Risks  Follow up Falls prevention discussed;Education provided;Falls evaluation completed Falls prevention discussed  Falls prevention discussed Falls prevention discussed      Functional Status Survey: Is the patient deaf or have difficulty hearing?: No Does the patient have difficulty seeing, even when wearing  glasses/contacts?: No Does the patient have difficulty concentrating, remembering, or making decisions?: No Does the patient have difficulty walking or climbing stairs?: No Does the patient have difficulty dressing or bathing?: No Does the patient have difficulty doing errands alone such as visiting a doctor's office or shopping?: No    Assessment & Plan  1. Acute non-recurrent frontal sinusitis  -  amoxicillin-clavulanate (AUGMENTIN) 875-125 MG tablet; Take 1 tablet by mouth 2 (two) times daily.  Dispense: 20 tablet; Refill: 0 - fluticasone (FLONASE) 50 MCG/ACT nasal spray; Place 2 sprays into both nostrils daily.  Dispense: 48 g; Refill: 1  2. Other allergic rhinitis  - fluticasone (FLONASE) 50 MCG/ACT nasal spray; Place 2 sprays into both nostrils daily.  Dispense: 48 g; Refill: 1  3. Breast cancer screening by mammogram  - MM Digital Screening; Future

## 2022-02-15 NOTE — Progress Notes (Unsigned)
Name: Paula [name]   MRN: [id number]    DOB: Dec 04, 1960   Date:02/15/2022       Progress Note  Subjective  Chief Complaint  Annual Exam  HPI  Patient presents for annual CPE.  Diet: *** Exercise: ***  Last Eye Exam: *** Last Dental Exam: ***  Flowsheet Row Office Visit from 11/20/2020 in [facility]  AUDIT-C Score 0      Depression: Phq 9 is  {Desc; negative/positive:13464}    02/04/2022    9:20 AM 09/24/2021    3:20 PM 06/14/2021    9:45 AM 05/21/2021    3:57 PM 11/20/2020    3:11 PM  Depression screen PHQ 2/9  Decreased Interest 0 0 0 0 0  Down, Depressed, Hopeless 0 0 0 0 0  PHQ - 2 Score 0 0 0 0 0  Altered sleeping 0  0 0   Tired, decreased energy 0  0 0   Change in appetite 0  0 0   Feeling bad or failure about yourself  0  0 0   Trouble concentrating 0  0 0   Moving slowly or fidgety/restless 0  0 0   Suicidal thoughts 0  0 0   PHQ-9 Score 0  0 0   Difficult doing work/chores   Not difficult at all     Hypertension: BP Readings from Last 3 Encounters:  02/04/22 130/82  09/24/21 134/86  08/28/21 137/82   Obesity: Wt Readings from Last 3 Encounters:  02/04/22 287 lb 9.6 oz (130.5 kg)  09/24/21 293 lb (132.9 kg)  06/14/21 298 lb 1.6 oz (135.2 kg)   BMI Readings from Last 3 Encounters:  02/04/22 49.37 kg/m  09/24/21 50.29 kg/m  06/14/21 51.17 kg/m     Vaccines:   HPV: N/A Tdap: up to date Shingrix: 1 of 2, 12/18/20 Pneumonia: N/A Flu: Due COVID-19: up to date   Hep C Screening: 09/18/15 STD testing and prevention (HIV/chl/gon/syphilis): 11/27/20 Intimate partner violence: negative screen  Sexual History : Menstrual History/LMP/Abnormal Bleeding:  Discussed importance of follow up if any post-menopausal bleeding: yes  Incontinence Symptoms: negative for symptoms   Breast cancer:  - Last Mammogram: 08/27/17, due- ordered today - BRCA gene screening: N/A  Osteoporosis Prevention : Discussed high calcium and vitamin D  supplementation, weight bearing exercises Bone density: N/A  Cervical cancer screening: 01/27/18  Skin cancer: Discussed monitoring for atypical lesions  Colorectal cancer: 05/11/13   Lung cancer:  Low Dose CT Chest recommended if Age 62-80 years, 20 pack-year currently smoking OR have quit w/in 15years. Patient does not qualify for screen   ECG: 04/25/18  Advanced Care Planning: A voluntary discussion about advance care planning including the explanation and discussion of advance directives.  Discussed health care proxy and Living will, and the patient was able to identify a health care proxy as ***.  Patient does not have a living will and power of attorney of health care   Lipids: Lab Results  Component Value Date   CHOL 260 (H) 09/26/2021   CHOL 230 (H) 11/27/2020   CHOL 236 (H) 11/29/2019   Lab Results  Component Value Date   HDL 99 09/26/2021   HDL 85 11/27/2020   HDL 86 11/29/2019   Lab Results  Component Value Date   LDLCALC 143 (H) 09/26/2021   LDLCALC 132 (H) 11/27/2020   LDLCALC 137 (H) 11/29/2019   Lab Results  Component Value Date   TRIG 109 09/26/2021   TRIG  76 11/27/2020   TRIG 76 11/29/2019   Lab Results  Component Value Date   CHOLHDL 2.6 09/26/2021   CHOLHDL 2.7 11/27/2020   CHOLHDL 2.7 11/29/2019   No results found for: "LDLDIRECT"  Glucose: Glucose  Date Value Ref Range Status  09/26/2021 93 70 - 99 mg/dL Final  11/27/2020 88 65 - 99 mg/dL Final    Comment:                   **Effective December 04, 2020 Glucose reference**                  interval will be changing to:                                                             70 - 99   11/29/2019 91 65 - 99 mg/dL Final  04/09/2013 97 65 - 99 mg/dL Final  05/14/2011 93 65 - 99 mg/dL Final   Glucose, Bld  Date Value Ref Range Status  04/25/2018 108 (H) 70 - 99 mg/dL Final  04/17/2018 99 70 - 99 mg/dL Final    Patient Active Problem List   Diagnosis Date Noted   GERD without  esophagitis 09/24/2021   Pure hypercholesterolemia 09/24/2021   B12 deficiency 09/24/2021   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   Pre-diabetes 01/01/2017   Sinus tarsi syndrome of right foot 06/17/2016   Plantar fasciitis, right 06/17/2016   Osteoarthritis of right foot 06/17/2016   Primary osteoarthritis, right ankle and foot 09/13/2015   Carpal tunnel syndrome, right 09/13/2015   Tarsal tunnel syndrome of right side 09/13/2015   Varicose veins of both lower extremities without ulcer or inflammation 09/23/2014   Mild intermittent asthma without complication 46/65/9935   Benign hypertension 08/15/2014   Herpes genitalis in women 08/15/2014   Allergic rhinitis 08/15/2014   Seizure ([place]) 08/15/2014   Vitamin D deficiency 08/15/2014   Morbid obesity ([place]) 08/03/2014   Adhesive capsulitis of left shoulder 06/16/2013   Obstructive sleep apnea of adult 06/16/2013   Epilepsy ([place]) 06/16/2013   Anemia, iron deficiency 09/30/2006    Past Surgical History:  Procedure Laterality Date   BREAST EXCISIONAL BIOPSY Left    age 46 tumor removed   BREAST SURGERY Left    benign lump at age 47    Family History  Problem Relation Age of Onset   Cancer Mother        Breast   Hypertension Mother    Diabetes Mother    Hyperlipidemia Mother    Breast cancer Mother     Social History   Socioeconomic History   Marital status: Soil scientist    Spouse name: Erbin    Number of children: 0   Years of education: Not on file   Highest education level: Associate degree: occupational, Hotel manager, or vocational program  Occupational History   Occupation: lab specialist    Employer: [employer]  Tobacco Use   Smoking status: Never   Smokeless tobacco: Never  Vaping Use   Vaping Use: Never used  Substance and Sexual Activity   Alcohol use: No    Alcohol/week: 0.0 standard drinks of alcohol   Drug use: No   Sexual activity: Not Currently  Other Topics Concern   Not on file  Social  History Narrative   Lives with  boyfriend   Never had children    Works for [employer]: [place]  (11/20/2020)   Overall Financial Resource Strain (CARDIA)    Difficulty of Paying Living Expenses: Not hard at all  Food Insecurity: No Food Insecurity (11/20/2020)   Hunger Vital Sign    Worried About Running Out of Food in the Last Year: Never true    Ran Out of Food in the Last Year: Never true  Transportation Needs: No Transportation Needs (11/20/2020)   PRAPARE - Hydrologist (Medical): No    Lack of Transportation (Non-Medical): No  Physical Activity: Inactive (11/20/2020)   Exercise Vital Sign    Days of Exercise per Week: 0 days    Minutes of Exercise per Session: 0 min  Stress: No Stress Concern Present (11/20/2020)   [place]    Feeling of Stress : Only a little  Social Connections: Moderately Isolated (11/20/2020)   Social Connection and Isolation Panel [NHANES]    Frequency of Communication with Friends and Family: More than three times a week    Frequency of Social Gatherings with Friends and Family: Three times a week    Attends Religious Services: 1 to 4 times per year    Active Member of Clubs or Organizations: No    Attends Archivist Meetings: Never    Marital Status: Never married  Intimate Partner Violence: Not At Risk (11/20/2020)   Humiliation, Afraid, Rape, and Kick questionnaire    Fear of Current or Ex-Partner: No    Emotionally Abused: No    Physically Abused: No    Sexually Abused: No     Current Outpatient Medications:    albuterol (VENTOLIN HFA) 108 (90 Base) MCG/ACT inhaler, Inhale 2 puffs into the lungs every 6 (six) hours as needed., Disp: 1 each, Rfl: 0   amLODipine (NORVASC) 5 MG tablet, TAKE 1 TABLET(5 MG) BY MOUTH DAILY, Disp: 90 tablet, Rfl: 0   amoxicillin-clavulanate (AUGMENTIN)  875-125 MG tablet, Take 1 tablet by mouth 2 (two) times daily., Disp: 20 tablet, Rfl: 0   budesonide-formoterol (SYMBICORT) 160-4.5 MCG/ACT inhaler, Inhale 2 puffs into the lungs 2 (two) times daily., Disp: 1 each, Rfl: 2   carbamazepine (TEGRETOL) 200 MG tablet, TAKE 1 AND 1/2 TABLETS BY  MOUTH IN THE MORNING AND  NOON AND 2 AND 1/2 TABLETS  BY MOUTH AT NIGHT, Disp: , Rfl:    cetirizine (ZYRTEC) 10 MG tablet, Take 1 tablet (10 mg total) by mouth daily., Disp: 30 tablet, Rfl: 1   fluticasone (FLONASE) 50 MCG/ACT nasal spray, Place 2 sprays into both nostrils daily., Disp: 48 g, Rfl: 1   levETIRAcetam (KEPPRA) 500 MG tablet, Take 3 tablets by mouth 2 (two) times daily., Disp: , Rfl:    PREVIDENT 5000 BOOSTER PLUS 1.1 % PSTE, , Disp: , Rfl:    topiramate (TOPAMAX) 100 MG tablet, Take 3 tablets by mouth 3 (three) times daily., Disp: , Rfl:    valACYclovir (VALTREX) 1000 MG tablet, Take 1 tablet (1,000 mg total) by mouth 2 (two) times daily., Disp: 20 tablet, Rfl: 0   valsartan-hydrochlorothiazide (DIOVAN-HCT) 160-12.5 MG tablet, Take 1 tablet by mouth daily. In place of telmisartan hctz, Disp: 90 tablet, Rfl: 1   Vitamin D, Ergocalciferol, (DRISDOL) 1.25 MG (50000 UNIT) CAPS capsule, Take 1 capsule (50,000 Units total) by  mouth every 7 (seven) days., Disp: 12 capsule, Rfl: 1  Allergies  Allergen Reactions   Ace Inhibitors      ROS  ***  Objective  There were no vitals filed for this visit.  There is no height or weight on file to calculate BMI.  Physical Exam ***  No results found for this or any previous visit (from the past 2160 hour(s)).   Fall Risk:    02/04/2022    9:19 AM 09/24/2021    3:20 PM 06/14/2021    9:45 AM 05/21/2021    3:56 PM 11/20/2020    3:11 PM  Fall Risk   Falls in the past year? 0 0 0 0 0  Number falls in past yr:  0 0 0 0  Injury with Fall?  0 0 0 0  Risk for fall due to : No Fall Risks No Fall Risks  No Fall Risks No Fall Risks  Follow up Falls  prevention discussed;Education provided;Falls evaluation completed Falls prevention discussed  Falls prevention discussed Falls prevention discussed     Functional Status Survey:     Assessment & Plan  1. Well adult exam ***   -USPSTF grade A and B recommendations reviewed with patient; age-appropriate recommendations, preventive care, screening tests, etc discussed and encouraged; healthy living encouraged; see AVS for patient education given to patient -Discussed importance of 150 minutes of physical activity weekly, eat two servings of fish weekly, eat one serving of tree nuts ( cashews, pistachios, pecans, almonds.Marland Kitchen) every other day, eat 6 servings of fruit/vegetables daily and drink plenty of water and avoid sweet beverages.   -Reviewed Health Maintenance: Yes.

## 2022-02-15 NOTE — Patient Instructions (Incomplete)
Preventive Care 40-59 Years Old, Female Preventive care refers to lifestyle choices and visits with your health care provider that can promote health and wellness. Preventive care visits are also called wellness exams. What can I expect for my preventive care visit? Counseling Your health care provider may ask you questions about your: Medical history, including: Past medical problems. Family medical history. Pregnancy history. Current health, including: Menstrual cycle. Method of birth control. Emotional well-being. Home life and relationship well-being. Sexual activity and sexual health. Lifestyle, including: Alcohol, nicotine or tobacco, and drug use. Access to firearms. Diet, exercise, and sleep habits. Work and work environment. Sunscreen use. Safety issues such as seatbelt and bike helmet use. Physical exam Your health care provider will check your: Height and weight. These may be used to calculate your BMI (body mass index). BMI is a measurement that tells if you are at a healthy weight. Waist circumference. This measures the distance around your waistline. This measurement also tells if you are at a healthy weight and may help predict your risk of certain diseases, such as type 2 diabetes and high blood pressure. Heart rate and blood pressure. Body temperature. Skin for abnormal spots. What immunizations do I need?  Vaccines are usually given at various ages, according to a schedule. Your health care provider will recommend vaccines for you based on your age, medical history, and lifestyle or other factors, such as travel or where you work. What tests do I need? Screening Your health care provider may recommend screening tests for certain conditions. This may include: Lipid and cholesterol levels. Diabetes screening. This is done by checking your blood sugar (glucose) after you have not eaten for a while (fasting). Pelvic exam and Pap test. Hepatitis B test. Hepatitis C  test. HIV (human immunodeficiency virus) test. STI (sexually transmitted infection) testing, if you are at risk. Lung cancer screening. Colorectal cancer screening. Mammogram. Talk with your health care provider about when you should start having regular mammograms. This may depend on whether you have a family history of breast cancer. BRCA-related cancer screening. This may be done if you have a family history of breast, ovarian, tubal, or peritoneal cancers. Bone density scan. This is done to screen for osteoporosis. Talk with your health care provider about your test results, treatment options, and if necessary, the need for more tests. Follow these instructions at home: Eating and drinking  Eat a diet that includes fresh fruits and vegetables, whole grains, lean protein, and low-fat dairy products. Take vitamin and mineral supplements as recommended by your health care provider. Do not drink alcohol if: Your health care provider tells you not to drink. You are pregnant, may be pregnant, or are planning to become pregnant. If you drink alcohol: Limit how much you have to 0-1 drink a day. Know how much alcohol is in your drink. In the [place] one drink equals one 12 oz bottle of beer (355 mL), one 5 oz glass of wine (148 mL), or one 1 oz glass of hard liquor (44 mL). Lifestyle Brush your teeth every morning and night with fluoride toothpaste. Floss one time each day. Exercise for at least 30 minutes 5 or more days each week. Do not use any products that contain nicotine or tobacco. These products include cigarettes, chewing tobacco, and vaping devices, such as e-cigarettes. If you need help quitting, ask your health care provider. Do not use drugs. If you are sexually active, practice safe sex. Use a condom or other form of protection to   prevent STIs. If you do not wish to become pregnant, use a form of birth control. If you plan to become pregnant, see your health care provider for a  prepregnancy visit. Take aspirin only as told by your health care provider. Make sure that you understand how much to take and what form to take. Work with your health care provider to find out whether it is safe and beneficial for you to take aspirin daily. Find healthy ways to manage stress, such as: Meditation, yoga, or listening to music. Journaling. Talking to a trusted person. Spending time with friends and family. Minimize exposure to UV radiation to reduce your risk of skin cancer. Safety Always wear your seat belt while driving or riding in a vehicle. Do not drive: If you have been drinking alcohol. Do not ride with someone who has been drinking. When you are tired or distracted. While texting. If you have been using any mind-altering substances or drugs. Wear a helmet and other protective equipment during sports activities. If you have firearms in your house, make sure you follow all gun safety procedures. Seek help if you have been physically or sexually abused. What's next? Visit your health care provider once a year for an annual wellness visit. Ask your health care provider how often you should have your eyes and teeth checked. Stay up to date on all vaccines. This information is not intended to replace advice given to you by your health care provider. Make sure you discuss any questions you have with your health care provider. Document Revised: 08/23/2020 Document Reviewed: 08/23/2020 Elsevier Patient Education  [place].

## 2022-02-18 ENCOUNTER — Ambulatory Visit (INDEPENDENT_AMBULATORY_CARE_PROVIDER_SITE_OTHER): Payer: 59 | Admitting: Family Medicine

## 2022-02-18 ENCOUNTER — Encounter: Payer: Self-pay | Admitting: Family Medicine

## 2022-02-18 VITALS — BP 124/70 | HR 91 | Temp 97.8°F | Resp 18 | Ht 64.0 in | Wt 288.8 lb

## 2022-02-18 DIAGNOSIS — Z1231 Encounter for screening mammogram for malignant neoplasm of breast: Secondary | ICD-10-CM

## 2022-02-18 DIAGNOSIS — R569 Unspecified convulsions: Secondary | ICD-10-CM | POA: Diagnosis not present

## 2022-02-18 DIAGNOSIS — J302 Other seasonal allergic rhinitis: Secondary | ICD-10-CM

## 2022-02-18 DIAGNOSIS — R7303 Prediabetes: Secondary | ICD-10-CM

## 2022-02-18 DIAGNOSIS — K219 Gastro-esophageal reflux disease without esophagitis: Secondary | ICD-10-CM

## 2022-02-18 DIAGNOSIS — J4520 Mild intermittent asthma, uncomplicated: Secondary | ICD-10-CM

## 2022-02-18 DIAGNOSIS — G4733 Obstructive sleep apnea (adult) (pediatric): Secondary | ICD-10-CM

## 2022-02-18 DIAGNOSIS — Z0000 Encounter for general adult medical examination without abnormal findings: Secondary | ICD-10-CM

## 2022-02-18 DIAGNOSIS — I10 Essential (primary) hypertension: Secondary | ICD-10-CM

## 2022-02-18 DIAGNOSIS — I340 Nonrheumatic mitral (valve) insufficiency: Secondary | ICD-10-CM | POA: Diagnosis not present

## 2022-02-18 DIAGNOSIS — E559 Vitamin D deficiency, unspecified: Secondary | ICD-10-CM

## 2022-02-18 DIAGNOSIS — E538 Deficiency of other specified B group vitamins: Secondary | ICD-10-CM

## 2022-02-18 DIAGNOSIS — Z23 Encounter for immunization: Secondary | ICD-10-CM | POA: Diagnosis not present

## 2022-02-18 MED ORDER — AMLODIPINE BESYLATE 5 MG PO TABS: ORAL_TABLET | ORAL | 1 refills | Status: DC

## 2022-02-18 MED ORDER — VALSARTAN-HYDROCHLOROTHIAZIDE 160-12.5 MG PO TABS: 1.0000 | ORAL_TABLET | Freq: Every day | ORAL | 1 refills | Status: DC

## 2022-02-18 MED ORDER — CETIRIZINE HCL 10 MG PO TABS: 10.0000 mg | ORAL_TABLET | Freq: Every day | ORAL | 1 refills | Status: AC

## 2022-02-18 MED ORDER — BUDESONIDE-FORMOTEROL FUMARATE 160-4.5 MCG/ACT IN AERO: 2.0000 | INHALATION_SPRAY | Freq: Two times a day (BID) | RESPIRATORY_TRACT | 2 refills | Status: DC

## 2022-02-18 MED ORDER — VITAMIN D (ERGOCALCIFEROL) 1.25 MG (50000 UNIT) PO CAPS: 50000.0000 [IU] | ORAL_CAPSULE | ORAL | 1 refills | Status: DC

## 2022-04-04 ENCOUNTER — Other Ambulatory Visit: Payer: Self-pay | Admitting: Family Medicine

## 2022-04-04 DIAGNOSIS — I10 Essential (primary) hypertension: Secondary | ICD-10-CM

## 2022-04-05 ENCOUNTER — Ambulatory Visit
Admission: RE | Admit: 2022-04-05 | Discharge: 2022-04-05 | Disposition: A | Discharge status: Home / Self Care | Payer: 59 | Source: Ambulatory Visit | Attending: Family Medicine | Admitting: Family Medicine

## 2022-04-05 DIAGNOSIS — Z1231 Encounter for screening mammogram for malignant neoplasm of breast: Secondary | ICD-10-CM | POA: Diagnosis present

## 2022-04-23 ENCOUNTER — Ambulatory Visit
Admission: RE | Admit: 2022-04-23 | Discharge: 2022-04-23 | Disposition: A | Discharge status: Home / Self Care | Payer: 59 | Attending: Neurosurgery | Admitting: Neurosurgery

## 2022-04-23 ENCOUNTER — Ambulatory Visit
Admission: RE | Admit: 2022-04-23 | Discharge: 2022-04-23 | Disposition: A | Discharge status: Home / Self Care | Payer: 59 | Source: Ambulatory Visit | Attending: Family | Admitting: Family

## 2022-04-23 ENCOUNTER — Other Ambulatory Visit: Payer: Self-pay | Admitting: Family

## 2022-04-23 DIAGNOSIS — S40012A Contusion of left shoulder, initial encounter: Secondary | ICD-10-CM

## 2022-04-23 DIAGNOSIS — S300XXA Contusion of lower back and pelvis, initial encounter: Secondary | ICD-10-CM | POA: Diagnosis present

## 2022-06-10 ENCOUNTER — Telehealth: Payer: Self-pay | Admitting: Family Medicine

## 2022-06-10 NOTE — Telephone Encounter (Unsigned)
Copied from [place] [id number]. Topic: General - Other >> Jun 10, 2022  3:32 PM Denman George T wrote: Reason for CRM:  Patient called requested any and all her recent bloodwork results be sent to her Urologist Elveria Royals in [facility].

## 2022-06-11 NOTE — Telephone Encounter (Signed)
Last was 09/2021. Patient advised to have a medical release form on file for [place] to be able to fax/share information for her. Verbalized understanding and will let us know if they need the results from then.

## 2022-08-21 ENCOUNTER — Other Ambulatory Visit: Payer: Self-pay | Admitting: Family Medicine

## 2022-08-21 DIAGNOSIS — I10 Essential (primary) hypertension: Secondary | ICD-10-CM

## 2022-08-22 ENCOUNTER — Other Ambulatory Visit: Payer: Self-pay

## 2022-08-22 ENCOUNTER — Other Ambulatory Visit: Payer: Self-pay | Admitting: Family Medicine

## 2022-08-22 DIAGNOSIS — I10 Essential (primary) hypertension: Secondary | ICD-10-CM

## 2022-08-22 DIAGNOSIS — E559 Vitamin D deficiency, unspecified: Secondary | ICD-10-CM

## 2022-08-22 MED ORDER — AMLODIPINE BESYLATE 5 MG PO TABS: ORAL_TABLET | ORAL | 0 refills | Status: DC

## 2022-08-23 NOTE — Progress Notes (Deleted)
Name: Paula [name]   MRN: [id number]    DOB: Jul 16, 1960   Date:08/23/2022       Progress Note  Subjective  Chief Complaint  Follow Up  HPI  HTN: she is compliant with her bp medication, taking Telmisartan hctz and Amlodipine. She denies dizziness, chest tightness or palpitation    Hyperlipidemia:     The 10-year ASCVD risk score (Arnett DK, et al., 2019) is: 5%   Values used to calculate the score:     Age: 62 years     Sex: Female     Is Non-Hispanic African American: Yes     Diabetic: No     Tobacco smoker: No     Systolic Blood Pressure: 124 mmHg     Is BP treated: Yes     HDL Cholesterol: 99 mg/dL     Total Cholesterol: 260 mg/dL    Seizure disorder: she is still under the care of [facility] neurologist, last visit 04/2020  no recent seizure, able to drive, last Tegretol level was sub-therapeutic - she is due for a visit at [facility] , I am going to give her Dr. Martyn Ehrich number today    Asthma mild: she states currently no problems, she states she has been using Symbicort prn , she states currently doing well stable    OSA: mild, she used to wear CPAP, lost weight and stopped, but even though she has gained weight she does not want to resume CPAP machine .  She denies morning headaches however  she wakes up with dry mouth.Neurologist wanted to repeat the sleep study but she has not been back for follow up  AR: taking medication and symptoms are stable.   Morbid Obesity: she is doing well on high protein diet but needs to increase vegetables.    Iron deficiency anemia and leucopenia: she used to see Dr. Orlie Dakin but was released, advised to go back if Hemoglobin drops below 10 and iron storages also drops. She needs to have labs done . Last level was 10.6.   Patient Active Problem List   Diagnosis Date Noted   GERD without esophagitis 09/24/2021   Pure hypercholesterolemia 09/24/2021   B12 deficiency 09/24/2021   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   Pre-diabetes 01/01/2017    Sinus tarsi syndrome of right foot 06/17/2016   Plantar fasciitis, right 06/17/2016   Osteoarthritis of right foot 06/17/2016   Primary osteoarthritis, right ankle and foot 09/13/2015   Carpal tunnel syndrome, right 09/13/2015   Tarsal tunnel syndrome of right side 09/13/2015   Varicose veins of both lower extremities without ulcer or inflammation 09/23/2014   Mild intermittent asthma without complication 08/15/2014   Benign hypertension 08/15/2014   Herpes genitalis in women 08/15/2014   Allergic rhinitis 08/15/2014   Seizure ([facility]) 08/15/2014   Vitamin D deficiency 08/15/2014   Morbid obesity ([facility]) 08/03/2014   Adhesive capsulitis of left shoulder 06/16/2013   Obstructive sleep apnea of adult 06/16/2013   Epilepsy ([facility]) 06/16/2013   Anemia, iron deficiency 09/30/2006    Past Surgical History:  Procedure Laterality Date   BREAST EXCISIONAL BIOPSY Left    age 78 tumor removed   BREAST SURGERY Left    benign lump at age 34    Family History  Problem Relation Age of Onset   Cancer Mother        Breast   Hypertension Mother    Diabetes Mother    Hyperlipidemia Mother    Breast cancer Mother  Social History   Tobacco Use   Smoking status: Never   Smokeless tobacco: Never  Substance Use Topics   Alcohol use: No    Alcohol/week: 0.0 standard drinks of alcohol     Current Outpatient Medications:    albuterol (VENTOLIN HFA) 108 (90 Base) MCG/ACT inhaler, Inhale 2 puffs into the lungs every 6 (six) hours as needed., Disp: 1 each, Rfl: 0   amLODipine (NORVASC) 5 MG tablet, TAKE 1 TABLET(5 MG) BY MOUTH DAILY, Disp: 90 tablet, Rfl: 0   budesonide-formoterol (SYMBICORT) 160-4.5 MCG/ACT inhaler, Inhale 2 puffs into the lungs 2 (two) times daily., Disp: 1 each, Rfl: 2   carbamazepine (TEGRETOL) 200 MG tablet, TAKE 1 AND 1/2 TABLETS BY  MOUTH IN THE MORNING AND  NOON AND 2 AND 1/2 TABLETS  BY MOUTH AT NIGHT, Disp: , Rfl:    cetirizine (ZYRTEC) 10 MG tablet, Take 1 tablet  (10 mg total) by mouth daily., Disp: 90 tablet, Rfl: 1   fluticasone (FLONASE) 50 MCG/ACT nasal spray, Place 2 sprays into both nostrils daily., Disp: 48 g, Rfl: 1   levETIRAcetam (KEPPRA) 500 MG tablet, Take 3 tablets by mouth 2 (two) times daily., Disp: , Rfl:    PREVIDENT 5000 BOOSTER PLUS 1.1 % PSTE, , Disp: , Rfl:    topiramate (TOPAMAX) 100 MG tablet, Take 3 tablets by mouth 3 (three) times daily., Disp: , Rfl:    valACYclovir (VALTREX) 1000 MG tablet, Take 1 tablet (1,000 mg total) by mouth 2 (two) times daily., Disp: 20 tablet, Rfl: 0   valsartan-hydrochlorothiazide (DIOVAN-HCT) 160-12.5 MG tablet, Take 1 tablet by mouth daily. In place of telmisartan hctz, Disp: 90 tablet, Rfl: 1   Vitamin D, Ergocalciferol, (DRISDOL) 1.25 MG (50000 UNIT) CAPS capsule, Take 1 capsule (50,000 Units total) by mouth every 7 (seven) days., Disp: 12 capsule, Rfl: 1  Allergies  Allergen Reactions   Ace Inhibitors     I personally reviewed active problem list, medication list, allergies, family history, social history, health maintenance with the patient/caregiver today.   ROS  ***  Objective  There were no vitals filed for this visit.  There is no height or weight on file to calculate BMI.  Physical Exam ***  No results found for this or any previous visit (from the past 2160 hour(s)).   PHQ2/9:    02/18/2022    3:34 PM 02/04/2022    9:20 AM 09/24/2021    3:20 PM 06/14/2021    9:45 AM 05/21/2021    3:57 PM  Depression screen PHQ 2/9  Decreased Interest 0 0 0 0 0  Down, Depressed, Hopeless 0 0 0 0 0  PHQ - 2 Score 0 0 0 0 0  Altered sleeping 0 0  0 0  Tired, decreased energy 0 0  0 0  Change in appetite 0 0  0 0  Feeling bad or failure about yourself  0 0  0 0  Trouble concentrating 0 0  0 0  Moving slowly or fidgety/restless 0 0  0 0  Suicidal thoughts 0 0  0 0  PHQ-9 Score 0 0  0 0  Difficult doing work/chores    Not difficult at all     phq 9 is {gen pos [id number]}   Fall  Risk:    02/04/2022    9:19 AM 09/24/2021    3:20 PM 06/14/2021    9:45 AM 05/21/2021    3:56 PM 11/20/2020    3:11 PM  Fall Risk  Falls in the past year? 0 0 0 0 0  Number falls in past yr:  0 0 0 0  Injury with Fall?  0 0 0 0  Risk for fall due to : No Fall Risks No Fall Risks  No Fall Risks No Fall Risks  Follow up Falls prevention discussed;Education provided;Falls evaluation completed Falls prevention discussed  Falls prevention discussed Falls prevention discussed      Functional Status Survey:      Assessment & Plan  *** There are no diagnoses linked to this encounter.

## 2022-08-26 ENCOUNTER — Ambulatory Visit: Payer: 59 | Admitting: Family Medicine

## 2022-10-03 ENCOUNTER — Other Ambulatory Visit: Payer: Self-pay | Admitting: Family Medicine

## 2022-10-03 DIAGNOSIS — I10 Essential (primary) hypertension: Secondary | ICD-10-CM

## 2022-10-04 ENCOUNTER — Other Ambulatory Visit: Payer: Self-pay

## 2022-10-04 ENCOUNTER — Other Ambulatory Visit: Payer: Self-pay | Admitting: Family Medicine

## 2022-10-04 ENCOUNTER — Telehealth: Payer: Self-pay | Admitting: Family Medicine

## 2022-10-04 DIAGNOSIS — I10 Essential (primary) hypertension: Secondary | ICD-10-CM

## 2022-10-04 MED ORDER — VALSARTAN-HYDROCHLOROTHIAZIDE 160-12.5 MG PO TABS: 1.0000 | ORAL_TABLET | Freq: Every day | ORAL | 0 refills | Status: DC

## 2022-10-04 NOTE — Telephone Encounter (Signed)
Lvm asking pt to return call to schedule appt. Dr Carlynn Purl is willing to give enough meds to last until she is able to come in

## 2022-10-04 NOTE — Telephone Encounter (Signed)
Requested Prescriptions  Pending Prescriptions Disp Refills   valsartan-hydrochlorothiazide (DIOVAN-HCT) 160-12.5 MG tablet [Pharmacy Med Name: VALSARTAN/HCTZ 160MG /12.5MG  TABLETS] 90 tablet     Sig: TAKE 1 TABLET BY MOUTH DAILY IN PLACE OF TELMISARTAN     Cardiovascular: ARB + Diuretic Combos Failed - 10/04/2022  4:39 PM      Failed - K in normal range and within 180 days    Potassium  Date Value Ref Range Status  09/26/2021 3.8 3.5 - 5.2 mmol/L Final  04/09/2013 3.6 3.5 - 5.1 mmol/L Final         Failed - Na in normal range and within 180 days    Sodium  Date Value Ref Range Status  09/26/2021 139 134 - 144 mmol/L Final  04/09/2013 138 136 - 145 mmol/L Final         Failed - Cr in normal range and within 180 days    Creatinine  Date Value Ref Range Status  04/09/2013 1.01 0.60 - 1.30 mg/dL Final   Creatinine, Ser  Date Value Ref Range Status  09/26/2021 0.92 0.57 - 1.00 mg/dL Final         Failed - eGFR is 10 or above and within 180 days    EGFR (African American)  Date Value Ref Range Status  04/09/2013 >60  Final   GFR calc Af Amer  Date Value Ref Range Status  11/29/2019 91 >59 mL/min/1.73 Final    Comment:    **[facility] currently reports eGFR in compliance with the current**   recommendations of the [employer]. [facility] will   update reporting as new guidelines are published from the NKF-ASN   Task force.    EGFR (Non-African Amer.)  Date Value Ref Range Status  04/09/2013 >60  Final    Comment:    eGFR values <75mL/min/1.73 m2 may be an indication of chronic kidney disease (CKD). Calculated eGFR is useful in patients with stable renal function. The eGFR calculation will not be reliable in acutely ill patients when serum creatinine is changing rapidly. It is not useful in  patients on dialysis. The eGFR calculation may not be applicable to patients at the low and high extremes of body sizes, pregnant women, and vegetarians.    GFR calc  non Af Amer  Date Value Ref Range Status  11/29/2019 79 >59 mL/min/1.73 Final   eGFR  Date Value Ref Range Status  09/26/2021 72 >59 mL/min/1.73 Final         Failed - Valid encounter within last 6 months    Recent Outpatient Visits           7 months ago Seizure [facility])   [place] [facility] Alba Cory, MD   8 months ago Acute non-recurrent frontal sinusitis   [facility] Alba Cory, MD   1 year ago Seizure [facility])   [facility] Alba Cory, MD   1 year ago Mild intermittent asthma without complication   [facility] Margarita Mail, DO   1 year ago Morbid obesity [facility])   [facility] Alba Cory, MD       Future Appointments             In 3 days Alba Cory, MD [facility]   In 4 months Alba Cory, MD [facility], [facility]            Passed - Patient is  not pregnant      Passed - Last BP in normal range    BP Readings from Last 1 Encounters:  02/18/22 124/70

## 2022-10-04 NOTE — Telephone Encounter (Signed)
Pt sch'd appt for Monday 7.29.2024 and is asking for enough of valsartan to last until then. She is completely out. Please send to [facility] [address]

## 2022-10-07 ENCOUNTER — Ambulatory Visit: Payer: 59 | Admitting: Family Medicine

## 2022-10-07 ENCOUNTER — Other Ambulatory Visit: Payer: Self-pay | Admitting: Family Medicine

## 2022-10-07 ENCOUNTER — Encounter: Payer: Self-pay | Admitting: Family Medicine

## 2022-10-07 DIAGNOSIS — E538 Deficiency of other specified B group vitamins: Secondary | ICD-10-CM | POA: Diagnosis not present

## 2022-10-07 DIAGNOSIS — R569 Unspecified convulsions: Secondary | ICD-10-CM

## 2022-10-07 DIAGNOSIS — I10 Essential (primary) hypertension: Secondary | ICD-10-CM

## 2022-10-07 DIAGNOSIS — J4520 Mild intermittent asthma, uncomplicated: Secondary | ICD-10-CM

## 2022-10-07 DIAGNOSIS — D508 Other iron deficiency anemias: Secondary | ICD-10-CM

## 2022-10-07 DIAGNOSIS — G4733 Obstructive sleep apnea (adult) (pediatric): Secondary | ICD-10-CM

## 2022-10-07 DIAGNOSIS — E7800 Pure hypercholesterolemia, unspecified: Secondary | ICD-10-CM

## 2022-10-07 DIAGNOSIS — E559 Vitamin D deficiency, unspecified: Secondary | ICD-10-CM

## 2022-10-07 DIAGNOSIS — R7303 Prediabetes: Secondary | ICD-10-CM

## 2022-10-07 MED ORDER — VALSARTAN-HYDROCHLOROTHIAZIDE 160-12.5 MG PO TABS: 1.0000 | ORAL_TABLET | Freq: Every day | ORAL | 1 refills | Status: DC

## 2022-10-07 MED ORDER — VITAMIN D (ERGOCALCIFEROL) 1.25 MG (50000 UNIT) PO CAPS: 50000.0000 [IU] | ORAL_CAPSULE | ORAL | 1 refills | Status: DC

## 2022-10-07 MED ORDER — AMLODIPINE BESYLATE 5 MG PO TABS: ORAL_TABLET | ORAL | 1 refills | Status: DC

## 2022-10-07 MED ORDER — BUDESONIDE-FORMOTEROL FUMARATE 160-4.5 MCG/ACT IN AERO: 2.0000 | INHALATION_SPRAY | Freq: Two times a day (BID) | RESPIRATORY_TRACT | 2 refills | Status: AC

## 2022-10-07 NOTE — Progress Notes (Signed)
Name: Paula [name]   MRN: [id number]    DOB: 23-Dec-1960   Date:10/07/2022       Progress Note  Subjective  Chief Complaint  Medication Refill  HPI  HTN: she is compliant with her bp medication, taking Valsartan/ hctz  160/12.5 mg and Amlodipine 5 mg . She denies dizziness, chest tightness or palpitation BP today was borderline when she arrived, but at goal on her last visit. She does not check her bp at home    Hyperlipidemia:     The 10-year ASCVD risk score (Arnett DK, et al., 2019) is: 6.2%   Values used to calculate the score:     Age: 62 years     Sex: Female     Is Non-Hispanic African American: Yes     Diabetic: No     Tobacco smoker: No     Systolic Blood Pressure: 128 mmHg     Is BP treated: Yes     HDL Cholesterol: 99 mg/dL     Total Cholesterol: 260 mg/dL    Seizure disorder: she is still under the care of [facility] neurologist, she is under the care of  Dr. Martyn Ehrich , she  goes once a year . She is compliant with medications and no episodes She would like for me to check her levels and send it to her neurologist    Asthma mild: she states currently no problems, she states she has been using Symbicort prn , she states currently doing well .    OSA: mild, she used to wear CPAP, lost weight and stopped, but even though she has gained weight she does not want to resume CPAP machine .  She denies morning headaches however  she wakes up with dry mouth.  AR: taking medication and symptoms are stable. Unchanged   Morbid Obesity: she is doing well on high protein diet but needs to increase vegetables.    Iron deficiency anemia and leucopenia: she used to see Dr. Orlie Dakin but was released, advised to go back if Hemoglobin drops below 10 and iron storages also drops. She needs to have labs done . 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   Pre-diabetes 01/01/2017   Sinus tarsi syndrome of right foot 06/17/2016   Plantar fasciitis, right 06/17/2016   Osteoarthritis of right foot 06/17/2016   Primary osteoarthritis, right ankle and foot 09/13/2015   Carpal tunnel syndrome, right 09/13/2015   Tarsal tunnel syndrome of right side 09/13/2015   Varicose veins of both lower extremities without ulcer or inflammation 09/23/2014   Mild intermittent asthma without complication 08/15/2014   Benign hypertension 08/15/2014   Herpes genitalis in women 08/15/2014   Allergic rhinitis 08/15/2014   Seizure (HCC) 08/15/2014   Vitamin D deficiency 08/15/2014   Morbid obesity (HCC) 08/03/2014   Adhesive capsulitis of left shoulder 06/16/2013   Obstructive sleep apnea of adult 06/16/2013   Epilepsy (HCC) 06/16/2013   Anemia, iron deficiency 09/30/2006    Past Surgical History:  Procedure Laterality Date   BREAST EXCISIONAL BIOPSY Left    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 removed   BREAST SURGERY Left    benign lump at age 7    Family History  Problem Relation Age of Onset   Cancer Mother        Breast   Hypertension Mother  Diabetes Mother    Hyperlipidemia Mother    Breast cancer Mother     Social History   Tobacco Use   Smoking status: Never   Smokeless tobacco: Never  Substance Use Topics   Alcohol use: No    Alcohol/week: 0.0 standard drinks of alcohol     Current Outpatient Medications:    albuterol (VENTOLIN HFA) 108 (90 Base) MCG/ACT inhaler, Inhale 2 puffs into the lungs every 6 (six) hours as needed., Disp: 1 each, Rfl: 0   carbamazepine (TEGRETOL) 200 MG tablet, TAKE 1 AND 1/2 TABLETS BY  MOUTH IN THE MORNING AND  NOON AND 2 AND 1/2 TABLETS  BY MOUTH AT NIGHT, Disp: , Rfl:    cetirizine (ZYRTEC) 10 MG tablet, Take 1 tablet (10 mg total) by mouth daily., Disp: 90 tablet, Rfl: 1   fluticasone (FLONASE) 50 MCG/ACT nasal spray, Place 2 sprays into both nostrils daily., Disp: 48 g, Rfl: 1    levETIRAcetam (KEPPRA) 500 MG tablet, Take 3 tablets by mouth 2 (two) times daily., Disp: , Rfl:    PREVIDENT 5000 BOOSTER PLUS 1.1 % PSTE, , Disp: , Rfl:    topiramate (TOPAMAX) 100 MG tablet, Take 3 tablets by mouth 3 (three) times daily., Disp: , Rfl:    valACYclovir (VALTREX) 1000 MG tablet, Take 1 tablet (1,000 mg total) by mouth 2 (two) times daily., Disp: 20 tablet, Rfl: 0   amLODipine (NORVASC) 5 MG tablet, TAKE 1 TABLET(5 MG) BY MOUTH DAILY, Disp: 90 tablet, Rfl: 1   budesonide-formoterol (SYMBICORT) 160-4.5 MCG/ACT inhaler, Inhale 2 puffs into the lungs 2 (two) times daily., Disp: 1 each, Rfl: 2   valsartan-hydrochlorothiazide (DIOVAN-HCT) 160-12.5 MG tablet, Take 1 tablet by mouth daily. In place of telmisartan hctz, Disp: 90 tablet, Rfl: 1   Vitamin D, Ergocalciferol, (DRISDOL) 1.25 MG (50000 UNIT) CAPS capsule, Take 1 capsule (50,000 Units total) by mouth every 7 (seven) days., Disp: 12 capsule, Rfl: 1  Allergies  Allergen Reactions   Ace Inhibitors     I personally reviewed active problem list, medication list, allergies, family history, social history, health maintenance with the patient/caregiver today.   ROS  Constitutional: Negative for fever or weight change.  Respiratory: Negative for cough and shortness of breath.   Cardiovascular: Negative for chest pain or palpitations.  Gastrointestinal: Negative for abdominal pain, no bowel changes.  Musculoskeletal: Negative for gait problem or joint swelling.  Skin: Negative for rash.  Neurological: Negative for dizziness or headache.  No other specific complaints in a complete review of systems (except as listed in HPI above).   Objective  Vitals:   10/07/22 1518 10/07/22 1558  BP: 136/74 128/80  Pulse: 87   Resp: 16   SpO2: 97%   Weight: 284 lb (128.8 kg)   Height: 5\' 4"  (1.626 m)     Body mass index is 48.75 kg/m.  Physical Exam  Constitutional: Patient appears well-developed and well-nourished. Obese  No  distress.  HEENT: head atraumatic, normocephalic, pupils equal and reactive to light, neck supple Cardiovascular: Normal rate, regular rhythm and normal heart sounds.  No murmur heard. No BLE edema. Pulmonary/Chest: Effort normal and breath sounds normal. No respiratory distress. Abdominal: Soft.  There is no tenderness. Psychiatric: Patient has a normal mood and affect. behavior is normal. Judgment and thought content normal.   PHQ2/9:    10/07/2022    3:18 PM 02/18/2022    3:34 PM 02/04/2022    9:20 AM 09/24/2021    3:20 PM 06/14/2021  9:45 AM  Depression screen PHQ 2/9  Decreased Interest 0 0 0 0 0  Down, Depressed, Hopeless 0 0 0 0 0  PHQ - 2 Score 0 0 0 0 0  Altered sleeping 0 0 0  0  Tired, decreased energy 0 0 0  0  Change in appetite 0 0 0  0  Feeling bad or failure about yourself  0 0 0  0  Trouble concentrating 0 0 0  0  Moving slowly or fidgety/restless 0 0 0  0  Suicidal thoughts 0 0 0  0  PHQ-9 Score 0 0 0  0  Difficult doing work/chores     Not difficult at all    phq 9 is negative   Fall Risk:    10/07/2022    3:18 PM 02/04/2022    9:19 AM 09/24/2021    3:20 PM 06/14/2021    9:45 AM 05/21/2021    3:56 PM  Fall Risk   Falls in the past year? 1 0 0 0 0  Number falls in past yr: 0  0 0 0  Injury with Fall? 0  0 0 0  Risk for fall due to : No Fall Risks No Fall Risks No Fall Risks  No Fall Risks  Follow up Falls prevention discussed Falls prevention discussed;Education provided;Falls evaluation completed Falls prevention discussed  Falls prevention discussed      Functional Status Survey: Is the patient deaf or have difficulty hearing?: No Does the patient have difficulty seeing, even when wearing glasses/contacts?: No Does the patient have difficulty concentrating, remembering, or making decisions?: No Does the patient have difficulty walking or climbing stairs?: No Does the patient have difficulty dressing or bathing?: No Does the patient have difficulty  doing errands alone such as visiting a doctor's office or shopping?: No    Assessment & Plan  1. Seizure (HCC)  - Levetiracetam level - Carbamazepine Level (Tegretol), total  2. Morbid obesity (HCC)  Discussed with the patient the risk posed by an increased BMI. Discussed importance of portion control, calorie counting and at least 150 minutes of physical activity weekly. Avoid sweet beverages and drink more water. Eat at least 6 servings of fruit and vegetables daily    3. Benign hypertension  - amLODipine (NORVASC) 5 MG tablet; TAKE 1 TABLET(5 MG) BY MOUTH DAILY  Dispense: 90 tablet; Refill: 1 - valsartan-hydrochlorothiazide (DIOVAN-HCT) 160-12.5 MG tablet; Take 1 tablet by mouth daily. In place of telmisartan hctz  Dispense: 90 tablet; Refill: 1 - Comprehensive metabolic panel  4. B12 deficiency  - B12 and Folate Panel  5. Vitamin D deficiency  - Vitamin D, Ergocalciferol, (DRISDOL) 1.25 MG (50000 UNIT) CAPS capsule; Take 1 capsule (50,000 Units total) by mouth every 7 (seven) days.  Dispense: 12 capsule; Refill: 1 - VITAMIN D 25 Hydroxy (Vit-D Deficiency, Fractures)  6. Obstructive sleep apnea of adult  Not compliant   7. Mild intermittent asthma without complication  - budesonide-formoterol (SYMBICORT) 160-4.5 MCG/ACT inhaler; Inhale 2 puffs into the lungs 2 (two) times daily.  Dispense: 1 each; Refill: 2  8. Pre-diabetes  - Hemoglobin A1c  9. Other iron deficiency anemia  - CBC with Differential/Platelet - Iron, TIBC and Ferritin Panel  10. Pure hypercholesterolemia  - Lipid panel

## 2022-10-29 LAB — CBC WITH DIFFERENTIAL/PLATELET
MCHC: 31.1 g/dL — ABNORMAL LOW (ref 31.5–35.7)
Monocytes Absolute: 0.6 10*3/uL (ref 0.1–0.9)
RBC: 4.06 x10E6/uL (ref 3.77–5.28)

## 2022-10-29 LAB — COMPREHENSIVE METABOLIC PANEL
Alkaline Phosphatase: 132 IU/L — ABNORMAL HIGH (ref 44–121)
BUN/Creatinine Ratio: 11 — ABNORMAL LOW (ref 12–28)
BUN: 10 mg/dL (ref 8–27)
CO2: 20 mmol/L (ref 20–29)

## 2022-10-29 LAB — CARBAMAZEPINE LEVEL, TOTAL: Carbamazepine (Tegretol), S: 7.7 ug/mL (ref 4.0–12.0)

## 2022-10-29 LAB — B12 AND FOLATE PANEL: Vitamin B-12: 327 pg/mL (ref 232–1245)

## 2023-02-13 NOTE — Progress Notes (Signed)
Name: Paula [name]   MRN: [id number]    DOB: 1960/05/22   Date:02/24/2023       Progress Note  Subjective  Chief Complaint  Chief Complaint  Patient presents with   Annual Exam    HPI  Patient presents for annual CPE.  Diet: taking protein shakes, small portions Exercise:  only at work - walks a lot at [employer] Exam: due for an exam Last Dental Exam: up to date  [employer] Visit from 02/24/2023 in [facility]  AUDIT-C Score 0      Depression: Phq 9 is  negative    02/24/2023    3:24 PM 10/07/2022    3:18 PM 02/18/2022    3:34 PM 02/04/2022    9:20 AM 09/24/2021    3:20 PM  Depression screen PHQ 2/9  Decreased Interest 0 0 0 0 0  Down, Depressed, Hopeless 0 0 0 0 0  PHQ - 2 Score 0 0 0 0 0  Altered sleeping 0 0 0 0   Tired, decreased energy 0 0 0 0   Change in appetite 0 0 0 0   Feeling bad or failure about yourself  0 0 0 0   Trouble concentrating 0 0 0 0   Moving slowly or fidgety/restless 0 0 0 0   Suicidal thoughts 0 0 0 0   PHQ-9 Score 0 0 0 0   Difficult doing work/chores Not difficult at all       Hypertension: BP Readings from Last 3 Encounters:  02/24/23 132/80  10/07/22 128/80  02/18/22 124/70   Obesity: Wt Readings from Last 3 Encounters:  02/24/23 283 lb 11.2 oz (128.7 kg)  10/07/22 284 lb (128.8 kg)  02/18/22 288 lb 12.8 oz (131 kg)   BMI Readings from Last 3 Encounters:  02/24/23 48.70 kg/m  10/07/22 48.75 kg/m  02/18/22 49.57 kg/m     Vaccines:   RSV: discussed with patient, refused  Tdap: 09/24/21 Shingrix: Complete Pneumonia: she wants to hold off for now , first dose caused arm pain  Flu: today  COVID-19: 2 doses   Hep C Screening: Complete STD testing and prevention (HIV/chl/gon/syphilis): HIV Complete Intimate partner violence: negative screen  Sexual History : not currently  Menstrual History/LMP/Abnormal Bleeding: post-menopausal  Discussed importance of follow up if  any post-menopausal bleeding: yes  Incontinence Symptoms: positive for symptoms   Breast cancer:  - Last Mammogram: 04/05/2022 - BRCA gene screening: mother had breast cancer in her 66's  Osteoporosis Prevention : Discussed high calcium and vitamin D supplementation, weight bearing exercises Bone density :not applicable   Cervical cancer screening: 01/27/2018, due today  Skin cancer: Discussed monitoring for atypical lesions  Colorectal cancer: 05/11/2013   Lung cancer:  Low Dose CT Chest recommended if Age 63-80 years, 20 pack-year currently smoking OR have quit w/in 15years. Patient does not qualify for screen   ECG: 04/25/18  Advanced Care Planning: A voluntary discussion about advance care planning including the explanation and discussion of advance directives.  Discussed health care proxy and Living will, and the patient was able to identify a health care proxy as her best friend Paula [name] .  Patient does not have a living will and power of attorney of health care   Lipids: Lab Results  Component Value Date   CHOL 232 (H) 10/28/2022   CHOL 260 (H) 09/26/2021   CHOL 230 (H) 11/27/2020   Lab Results  Component Value Date  HDL 86 10/28/2022   HDL 99 09/26/2021   HDL 85 11/27/2020   Lab Results  Component Value Date   LDLCALC 132 (H) 10/28/2022   LDLCALC 143 (H) 09/26/2021   LDLCALC 132 (H) 11/27/2020   Lab Results  Component Value Date   TRIG 84 10/28/2022   TRIG 109 09/26/2021   TRIG 76 11/27/2020   Lab Results  Component Value Date   CHOLHDL 2.7 10/28/2022   CHOLHDL 2.6 09/26/2021   CHOLHDL 2.7 11/27/2020   No results found for: "LDLDIRECT"  Glucose: Glucose  Date Value Ref Range Status  10/28/2022 95 70 - 99 mg/dL Final  16/12/9602 93 70 - 99 mg/dL Final  54/11/8117 88 65 - 99 mg/dL Final    Comment:                   **Effective December 04, 2020 Glucose reference**                  interval will be changing to:                                                              70 - 99   04/09/2013 97 65 - 99 mg/dL Final  14/78/2956 93 65 - 99 mg/dL Final   Glucose, Bld  Date Value Ref Range Status  04/25/2018 108 (H) 70 - 99 mg/dL Final  21/30/8657 99 70 - 99 mg/dL Final    Patient Active Problem List   Diagnosis Date Noted   GERD without esophagitis 09/24/2021   Pure hypercholesterolemia 09/24/2021   B12 deficiency 09/24/2021   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   Pre-diabetes 01/01/2017   Sinus tarsi syndrome of right foot 06/17/2016   Plantar fasciitis, right 06/17/2016   Osteoarthritis of right foot 06/17/2016   Primary osteoarthritis, right ankle and foot 09/13/2015   Carpal tunnel syndrome, right 09/13/2015   Tarsal tunnel syndrome of right side 09/13/2015   Varicose veins of both lower extremities without ulcer or inflammation 09/23/2014   Mild intermittent asthma without complication 08/15/2014   Benign hypertension 08/15/2014   Herpes genitalis in women 08/15/2014   Allergic rhinitis 08/15/2014   Seizure (HCC) 08/15/2014   Vitamin D deficiency 08/15/2014   Morbid obesity (HCC) 08/03/2014   Adhesive capsulitis of left shoulder 06/16/2013   Obstructive sleep apnea of adult 06/16/2013   Epilepsy (HCC) 06/16/2013   Anemia, iron deficiency 09/30/2006    Past Surgical History:  Procedure Laterality Date   BREAST EXCISIONAL BIOPSY Left    age 16 tumor removed   BREAST SURGERY Left    benign lump at age 50    Family History  Problem Relation Age of Onset   Cancer Mother        Breast   Hypertension Mother    Diabetes Mother    Hyperlipidemia Mother    Breast cancer Mother     Social History   Socioeconomic History   Marital status: Media planner    Spouse name: Paula [name]    Number of children: 0   Years of education: Not on file   Highest education level: Associate degree: occupational, Scientist, product/process development, or vocational program  Occupational History   Occupation: lab specialist    Employer: [employer]   Tobacco Use   Smoking status:  Never   Smokeless tobacco: Never  Vaping Use   Vaping status: Never Used  Substance and Sexual Activity   Alcohol use: No    Alcohol/week: 0.0 standard drinks of alcohol   Drug use: No   Sexual activity: Not Currently  Other Topics Concern   Not on file  Social History Narrative   Lives with  boyfriend   Never had children    Works for [employer]    Social Drivers of [employer] Strain: Low Risk  (02/24/2023)   Overall Financial Resource Strain (CARDIA)    Difficulty of Paying Living Expenses: Not hard at all  Food Insecurity: No Food Insecurity (02/24/2023)   Hunger Vital Sign    Worried About Running Out of Food in the Last Year: Never true    Ran Out of Food in the Last Year: Never true  Transportation Needs: No Transportation Needs (02/24/2023)   PRAPARE - Administrator, Civil Service (Medical): No    Lack of Transportation (Non-Medical): No  Physical Activity: Inactive (02/24/2023)   Exercise Vital Sign    Days of Exercise per Week: 0 days    Minutes of Exercise per Session: 0 min  Stress: No Stress Concern Present (02/24/2023)   [employer] of Occupational Health - Occupational Stress Questionnaire    Feeling of Stress : Not at all  Social Connections: Moderately Integrated (02/24/2023)   Social Connection and Isolation Panel [NHANES]    Frequency of Communication with Friends and Family: More than three times a week    Frequency of Social Gatherings with Friends and Family: More than three times a week    Attends Religious Services: More than 4 times per year    Active Member of [employer] or Organizations: No    Attends Banker Meetings: Never    Marital Status: Living with partner  Intimate Partner Violence: Not At Risk (02/24/2023)   Humiliation, Afraid, Rape, and Kick questionnaire    Fear of Current or Ex-Partner: No    Emotionally Abused: No    Physically Abused: No    Sexually  Abused: No     Current Outpatient Medications:    albuterol (VENTOLIN HFA) 108 (90 Base) MCG/ACT inhaler, Inhale 2 puffs into the lungs every 6 (six) hours as needed., Disp: 1 each, Rfl: 0   amLODipine (NORVASC) 5 MG tablet, TAKE 1 TABLET(5 MG) BY MOUTH DAILY, Disp: 90 tablet, Rfl: 1   budesonide-formoterol (SYMBICORT) 160-4.5 MCG/ACT inhaler, Inhale 2 puffs into the lungs 2 (two) times daily., Disp: 1 each, Rfl: 2   carbamazepine (TEGRETOL) 200 MG tablet, TAKE 1 AND 1/2 TABLETS BY  MOUTH IN THE MORNING AND  NOON AND 2 AND 1/2 TABLETS  BY MOUTH AT NIGHT, Disp: , Rfl:    cetirizine (ZYRTEC) 10 MG tablet, Take 1 tablet (10 mg total) by mouth daily., Disp: 90 tablet, Rfl: 1   fluticasone (FLONASE) 50 MCG/ACT nasal spray, Place 2 sprays into both nostrils daily., Disp: 48 g, Rfl: 1   levETIRAcetam (KEPPRA) 500 MG tablet, Take 3 tablets by mouth 2 (two) times daily., Disp: , Rfl:    PREVIDENT 5000 BOOSTER PLUS 1.1 % PSTE, , Disp: , Rfl:    topiramate (TOPAMAX) 100 MG tablet, Take 3 tablets by mouth 3 (three) times daily., Disp: , Rfl:    valACYclovir (VALTREX) 1000 MG tablet, Take 1 tablet (1,000 mg total) by mouth 2 (two) times daily., Disp: 20 tablet, Rfl: 0   valsartan-hydrochlorothiazide (DIOVAN-HCT)  160-12.5 MG tablet, Take 1 tablet by mouth daily. In place of telmisartan hctz, Disp: 90 tablet, Rfl: 1   Vitamin D, Ergocalciferol, (DRISDOL) 1.25 MG (50000 UNIT) CAPS capsule, Take 1 capsule (50,000 Units total) by mouth every 7 (seven) days., Disp: 12 capsule, Rfl: 1  Allergies  Allergen Reactions   Ace Inhibitors      ROS  Constitutional: Negative for fever or weight change.  Respiratory: Negative for cough and shortness of breath.   Cardiovascular: Negative for chest pain or palpitations.  Gastrointestinal: Negative for abdominal pain, no bowel changes.  Musculoskeletal: Negative for gait problem or joint swelling.  Skin: Negative for rash.  Neurological: Negative for dizziness or  headache.  No other specific complaints in a complete review of systems (except as listed in HPI above).   Objective  Vitals:   02/24/23 1527  BP: 132/80  Pulse: 79  Resp: 16  Temp: 98.1 F (36.7 C)  TempSrc: Oral  SpO2: 99%  Weight: 283 lb 11.2 oz (128.7 kg)  Height: 5\' 4"  (1.626 m)    Body mass index is 48.7 kg/m.  Physical Exam  Constitutional: Patient appears well-developed and well-nourished. No distress.  HENT: Head: Normocephalic and atraumatic. Ears: B TMs ok, no erythema or effusion; Nose: Nose normal. Mouth/Throat: Oropharynx is clear and moist. poor dentition Eyes: Conjunctivae and EOM are normal. Pupils are equal, round, and reactive to light. No scleral icterus.  Neck: Normal range of motion. Neck supple. No JVD present. No thyromegaly present.  Cardiovascular: Normal rate, regular rhythm and normal heart sounds.  No murmur heard. No BLE edema. Pulmonary/Chest: Effort normal and breath sounds normal. No respiratory distress. Abdominal: Soft. Bowel sounds are normal, no distension. There is no tenderness. no masses Breast: no lumps or masses, no nipple discharge or rashes FEMALE GENITALIA:  External genitalia normal External urethra normal Vaginal vault normal without discharge or lesions Cervix normal without discharge or lesions Bimanual exam difficult to do secondary to obesity  RECTAL: not done  Musculoskeletal: Normal range of motion, no joint effusions. slow gait Neurological: he is alert and oriented to person, place, and time. No cranial nerve deficit. Coordination, balance, strength, speech and gait are normal.  Skin: Skin is warm and dry. No rash noted. No erythema.  Psychiatric: Patient has a normal mood and affect. behavior is normal. Judgment and thought content normal.    Fall Risk:    02/24/2023    3:12 PM 10/07/2022    3:18 PM 02/04/2022    9:19 AM 09/24/2021    3:20 PM 06/14/2021    9:45 AM  Fall Risk   Falls in the past year? 0 1 0 0 0   Number falls in past yr: 0 0  0 0  Injury with Fall? 0 0  0 0  Risk for fall due to : No Fall Risks No Fall Risks No Fall Risks No Fall Risks   Follow up Falls prevention discussed;Education provided;Falls evaluation completed Falls prevention discussed Falls prevention discussed;Education provided;Falls evaluation completed Falls prevention discussed      Assessment & Plan  1. Well adult exam (Primary)  - MM 3D SCREENING MAMMOGRAM BILATERAL BREAST; Future - Lipid panel - CBC with Differential/Platelet - Comprehensive metabolic panel - Hemoglobin A1c - B12 and Folate Panel - VITAMIN D 25 Hydroxy (Vit-D Deficiency, Fractures) - TSH - Carbamazepine Level (Tegretol), total - Levetiracetam level  2. Pure hypercholesterolemia  - Lipid panel - Comprehensive metabolic panel  3. Pre-diabetes  - Hemoglobin A1c  4. Vitamin D deficiency  - VITAMIN D 25 Hydroxy (Vit-D Deficiency, Fractures)  5. B12 deficiency  - CBC with Differential/Platelet - B12 and Folate Panel  6. Cervical cancer screening  - Pap LB (liquid-based)  7. Breast cancer screening by mammogram  Mammogram  8. Seizure (HCC)  - Carbamazepine Level (Tegretol), total - Levetiracetam level  9. Needs flu shot  - Flu vaccine trivalent PF, 6mos and older(Flulaval,Afluria,Fluarix,Fluzone)   -USPSTF grade A and B recommendations reviewed with patient; age-appropriate recommendations, preventive care, screening tests, etc discussed and encouraged; healthy living encouraged; see AVS for patient education given to patient -Discussed importance of 150 minutes of physical activity weekly, eat two servings of fish weekly, eat one serving of tree nuts ( cashews, pistachios, pecans, almonds.Marland Kitchen) every other day, eat 6 servings of fruit/vegetables daily and drink plenty of water and avoid sweet beverages.   -Reviewed Health Maintenance: Yes.

## 2023-02-24 ENCOUNTER — Ambulatory Visit (INDEPENDENT_AMBULATORY_CARE_PROVIDER_SITE_OTHER): Payer: 59 | Admitting: Family Medicine

## 2023-02-24 ENCOUNTER — Encounter: Payer: Self-pay | Admitting: Family Medicine

## 2023-02-24 VITALS — BP 132/80 | HR 79 | Temp 98.1°F | Resp 16 | Ht 64.0 in | Wt 283.7 lb

## 2023-02-24 DIAGNOSIS — Z0000 Encounter for general adult medical examination without abnormal findings: Secondary | ICD-10-CM

## 2023-02-24 DIAGNOSIS — Z1231 Encounter for screening mammogram for malignant neoplasm of breast: Secondary | ICD-10-CM

## 2023-02-24 DIAGNOSIS — E559 Vitamin D deficiency, unspecified: Secondary | ICD-10-CM

## 2023-02-24 DIAGNOSIS — R7303 Prediabetes: Secondary | ICD-10-CM | POA: Diagnosis not present

## 2023-02-24 DIAGNOSIS — Z23 Encounter for immunization: Secondary | ICD-10-CM

## 2023-02-24 DIAGNOSIS — Z0001 Encounter for general adult medical examination with abnormal findings: Secondary | ICD-10-CM

## 2023-02-24 DIAGNOSIS — Z124 Encounter for screening for malignant neoplasm of cervix: Secondary | ICD-10-CM

## 2023-02-24 DIAGNOSIS — E7800 Pure hypercholesterolemia, unspecified: Secondary | ICD-10-CM | POA: Diagnosis not present

## 2023-02-24 DIAGNOSIS — E538 Deficiency of other specified B group vitamins: Secondary | ICD-10-CM

## 2023-02-24 DIAGNOSIS — R569 Unspecified convulsions: Secondary | ICD-10-CM

## 2023-03-01 LAB — PAP LB (LIQUID-BASED)

## 2023-03-01 LAB — SPECIMEN STATUS REPORT

## 2023-03-05 LAB — HPV APTIMA

## 2023-03-05 LAB — SPECIMEN STATUS REPORT

## 2023-03-10 ENCOUNTER — Ambulatory Visit: Payer: 59 | Admitting: Family Medicine

## 2023-03-10 ENCOUNTER — Encounter: Payer: Self-pay | Admitting: Family Medicine

## 2023-03-10 VITALS — BP 126/86 | HR 96 | Temp 98.0°F | Resp 18 | Ht 64.0 in | Wt 283.3 lb

## 2023-03-10 DIAGNOSIS — E7800 Pure hypercholesterolemia, unspecified: Secondary | ICD-10-CM

## 2023-03-10 DIAGNOSIS — B002 Herpesviral gingivostomatitis and pharyngotonsillitis: Secondary | ICD-10-CM

## 2023-03-10 DIAGNOSIS — R7303 Prediabetes: Secondary | ICD-10-CM

## 2023-03-10 DIAGNOSIS — R569 Unspecified convulsions: Secondary | ICD-10-CM | POA: Diagnosis not present

## 2023-03-10 DIAGNOSIS — G4733 Obstructive sleep apnea (adult) (pediatric): Secondary | ICD-10-CM | POA: Diagnosis not present

## 2023-03-10 DIAGNOSIS — I10 Essential (primary) hypertension: Secondary | ICD-10-CM

## 2023-03-10 DIAGNOSIS — J4520 Mild intermittent asthma, uncomplicated: Secondary | ICD-10-CM

## 2023-03-10 DIAGNOSIS — E559 Vitamin D deficiency, unspecified: Secondary | ICD-10-CM

## 2023-03-10 LAB — CBC WITH DIFFERENTIAL/PLATELET
Basophils Absolute: 0 10*3/uL (ref 0.0–0.2)
Basos: 1 %
EOS (ABSOLUTE): 0.2 10*3/uL (ref 0.0–0.4)
Eos: 5 %
Hematocrit: 34.4 % (ref 34.0–46.6)
Hemoglobin: 10.6 g/dL — ABNORMAL LOW (ref 11.1–15.9)
Immature Grans (Abs): 0 10*3/uL (ref 0.0–0.1)
Immature Granulocytes: 0 %
Lymphocytes Absolute: 1 10*3/uL (ref 0.7–3.1)
Lymphs: 30 %
MCH: 26.5 pg — ABNORMAL LOW (ref 26.6–33.0)
MCHC: 30.8 g/dL — ABNORMAL LOW (ref 31.5–35.7)
MCV: 86 fL (ref 79–97)
Monocytes Absolute: 0.3 10*3/uL (ref 0.1–0.9)
Monocytes: 10 %
Neutrophils Absolute: 1.8 10*3/uL (ref 1.4–7.0)
Neutrophils: 54 %
Platelets: 196 10*3/uL (ref 150–450)
RBC: 4 x10E6/uL (ref 3.77–5.28)
RDW: 13.3 % (ref 11.7–15.4)
WBC: 3.4 10*3/uL (ref 3.4–10.8)

## 2023-03-10 LAB — COMPREHENSIVE METABOLIC PANEL
ALT: 9 [IU]/L (ref 0–32)
AST: 13 [IU]/L (ref 0–40)
Albumin: 4.2 g/dL (ref 3.8–4.9)
Alkaline Phosphatase: 160 [IU]/L — ABNORMAL HIGH (ref 44–121)
BUN/Creatinine Ratio: 15 (ref 12–28)
BUN: 13 mg/dL (ref 8–27)
Bilirubin Total: <0.2 mg/dL (ref 0.0–1.2)
CO2: 22 mmol/L (ref 20–29)
Calcium: 9.3 mg/dL (ref 8.7–10.3)
Chloride: 105 mmol/L (ref 96–106)
Creatinine, Ser: 0.86 mg/dL (ref 0.57–1.00)
Globulin, Total: 2.9 g/dL (ref 1.5–4.5)
Glucose: 87 mg/dL (ref 70–99)
Potassium: 3.8 mmol/L (ref 3.5–5.2)
Sodium: 139 mmol/L (ref 134–144)
Total Protein: 7.1 g/dL (ref 6.0–8.5)
eGFR: 77 mL/min/{1.73_m2} (ref >59)

## 2023-03-10 LAB — LIPID PANEL
Chol/HDL Ratio: 2.6 {ratio} (ref 0.0–4.4)
Cholesterol, Total: 220 mg/dL — ABNORMAL HIGH (ref 100–199)
HDL: 84 mg/dL (ref >39)
LDL Chol Calc (NIH): 123 mg/dL — ABNORMAL HIGH (ref 0–99)
Triglycerides: 73 mg/dL (ref 0–149)
VLDL Cholesterol Cal: 13 mg/dL (ref 5–40)

## 2023-03-10 LAB — HEMOGLOBIN A1C
Est. average glucose Bld gHb Est-mCnc: 120 mg/dL
Hgb A1c MFr Bld: 5.8 % — ABNORMAL HIGH (ref 4.8–5.6)

## 2023-03-10 LAB — VITAMIN D 25 HYDROXY (VIT D DEFICIENCY, FRACTURES): Vit D, 25-Hydroxy: 16.4 ng/mL — ABNORMAL LOW (ref 30.0–100.0)

## 2023-03-10 LAB — LEVETIRACETAM LEVEL: Levetiracetam Lvl: 23.1 ug/mL (ref 10.0–40.0)

## 2023-03-10 LAB — CARBAMAZEPINE LEVEL, TOTAL: Carbamazepine (Tegretol), S: 10.9 ug/mL (ref 4.0–12.0)

## 2023-03-10 LAB — TSH: TSH: 2.01 u[IU]/mL (ref 0.450–4.500)

## 2023-03-10 LAB — B12 AND FOLATE PANEL
Folate: 10.2 ng/mL (ref >3.0)
Vitamin B-12: 409 pg/mL (ref 232–1245)

## 2023-03-10 MED ORDER — VITAMIN D (ERGOCALCIFEROL) 1.25 MG (50000 UNIT) PO CAPS: 50000.0000 [IU] | ORAL_CAPSULE | ORAL | 1 refills | Status: DC

## 2023-03-10 MED ORDER — VALACYCLOVIR HCL 500 MG PO TABS: 500.0000 mg | ORAL_TABLET | Freq: Three times a day (TID) | ORAL | 1 refills | Status: AC

## 2023-03-10 MED ORDER — AMLODIPINE BESYLATE 5 MG PO TABS: ORAL_TABLET | ORAL | 1 refills | Status: DC

## 2023-03-10 MED ORDER — VALSARTAN-HYDROCHLOROTHIAZIDE 160-12.5 MG PO TABS: 1.0000 | ORAL_TABLET | Freq: Every day | ORAL | 1 refills | Status: DC

## 2023-03-10 NOTE — Progress Notes (Signed)
Name: Paula [name]   MRN: [id number]    DOB: 01-Feb-1961   Date:03/10/2023       Progress Note  Subjective  Chief Complaint  Chief Complaint  Patient presents with   Medical Management of Chronic Issues    HPI Discussed the use of AI scribe software for clinical note transcription with the patient, who gave verbal consent to proceed.  Discussed the use of AI scribe software for clinical note transcription with the patient, who gave verbal consent to proceed.  History of Present Illness   The patient, with a history of hypertension, hyperlipidemia, seizures, and prediabetes, presents for a six-month follow-up. She reports no side effects from her current hypertension medications, valsartan HCTZ and amlodipine, and has been adherent to her regimen. She denies any symptoms of dizziness, chest tightness, or palpitations.  The patient's seizure control has been good with her current regimen o, Keppra, Tegretol, and Topamax. She reports no recent seizures and continues to follow up with her neurologist.  She has been diagnosed with iron deficiency anemia, but recent labs show normal iron and B12 levels. The patient has been taking B12 tablets. Her hemoglobin has been consistently around 10.6, and it is suspected that this may be her normal due to anemia of chronic disease. Previously evaluated by Dr. Orlie Dakin  The patient has a history of mild intermittent asthma and uses Symbicort as needed. She reports no current wheezing, coughing, or shortness of breath. She also has a history of sleep apnea but has not been using a CPAP machine, reporting no current symptoms such as waking up with headaches.  The patient has been diagnosed with prediabetes and has been advised to cut down on carbohydrates. She reports not consuming many sweets.  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   Pre-diabetes 01/01/2017   Sinus tarsi syndrome of right foot 06/17/2016   Plantar fasciitis, right 06/17/2016   Osteoarthritis of right foot 06/17/2016   Primary osteoarthritis, right ankle and foot 09/13/2015   Carpal tunnel syndrome, right 09/13/2015   Tarsal tunnel syndrome of right side 09/13/2015   Varicose veins of both lower extremities without ulcer or inflammation 09/23/2014   Mild intermittent asthma without complication 08/15/2014   Benign hypertension 08/15/2014   Herpes genitalis in women 08/15/2014   Allergic rhinitis 08/15/2014   Seizure (HCC) 08/15/2014   Vitamin D deficiency 08/15/2014   Morbid obesity (HCC) 08/03/2014   Adhesive capsulitis of left shoulder 06/16/2013   Obstructive sleep apnea of adult 06/16/2013   Anemia, iron deficiency 09/30/2006    Past Surgical History:  Procedure Laterality Date   BREAST EXCISIONAL BIOPSY Left    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8 tumor  removed   BREAST SURGERY Left    benign lump at age 89    Family History  Problem Relation Age of Onset   Cancer Mother        Breast   Hypertension Mother    Diabetes Mother    Hyperlipidemia Mother    Breast cancer Mother     Social History   Tobacco Use   Smoking status: Never   Smokeless tobacco: Never  Substance Use Topics   Alcohol use: No    Alcohol/week: 0.0 standard drinks of alcohol     Current Outpatient Medications:    albuterol (VENTOLIN HFA) 108 (90 Base) MCG/ACT inhaler, Inhale 2 puffs into the lungs every 6  (six) hours as needed., Disp: 1 each, Rfl: 0   amLODipine (NORVASC) 5 MG tablet, TAKE 1 TABLET(5 MG) BY MOUTH DAILY, Disp: 90 tablet, Rfl: 1   budesonide-formoterol (SYMBICORT) 160-4.5 MCG/ACT inhaler, Inhale 2 puffs into the lungs 2 (two) times daily., Disp: 1 each, Rfl: 2   carbamazepine (TEGRETOL) 200 MG tablet, TAKE 1 AND 1/2 TABLETS BY  MOUTH IN THE MORNING AND  NOON AND 2 AND 1/2 TABLETS  BY MOUTH AT NIGHT, Disp: , Rfl:    cetirizine (ZYRTEC) 10 MG tablet, Take 1 tablet (10 mg total) by mouth daily., Disp: 90 tablet, Rfl: 1   fluticasone (FLONASE) 50 MCG/ACT nasal spray, Place 2 sprays into both nostrils daily., Disp: 48 g, Rfl: 1   levETIRAcetam (KEPPRA) 500 MG tablet, Take 3 tablets by mouth 2 (two) times daily., Disp: , Rfl:    PREVIDENT 5000 BOOSTER PLUS 1.1 % PSTE, , Disp: , Rfl:    topiramate (TOPAMAX) 100 MG tablet, Take 3 tablets by mouth 3 (three) times daily., Disp: , Rfl:    valACYclovir (VALTREX) 1000 MG tablet, Take 1 tablet (1,000 mg total) by mouth 2 (two) times daily., Disp: 20 tablet, Rfl: 0   valsartan-hydrochlorothiazide (DIOVAN-HCT) 160-12.5 MG tablet, Take 1 tablet by mouth daily. In place of telmisartan hctz, Disp: 90 tablet, Rfl: 1   Vitamin D, Ergocalciferol, (DRISDOL) 1.25 MG (50000 UNIT) CAPS capsule, Take 1 capsule (50,000 Units total) by mouth every 7 (seven) days., Disp: 12 capsule, Rfl: 1  Allergies  Allergen Reactions   Ace Inhibitors     I personally reviewed active problem list, medication list, allergies, family history with the patient/caregiver today.   ROS  Ten systems reviewed and is negative except as mentioned in HPI    Objective  Vitals:   03/10/23 1459  BP: 126/86  Pulse: 96  Resp: 18  Temp: 98 F (36.7 C)  SpO2: 99%  Weight: 283 lb 4.8 oz (128.5 kg)  Height: 5\' 4"  (1.626 m)    Body mass index is 48.63 kg/m.  Physical Exam  Constitutional: Patient appears well-developed and well-nourished. Obese  No distress.  HEENT: head  atraumatic, normocephalic, pupils equal and reactive to light, neck supple Cardiovascular: Normal rate, regular rhythm and normal heart sounds.  No murmur heard. No BLE edema. Pulmonary/Chest: Effort normal and breath sounds normal. No respiratory distress. Abdominal: Soft.  There is no tenderness. Psychiatric: Patient has a normal mood and affect. behavior is normal. Judgment and thought content normal.   Recent Results (from the past 2160 hours)  Pap LB (liquid-based)     Status: None   Collection Time: 02/25/23 12:00 AM  Result Value Ref Range   Interpretation NILM,QC     Comment: NEGATIVE FOR INTRAEPITHELIAL LESION OR MALIGNANCY. THIS SPECIMEN WAS RESCREENED AS  PART OF OUR QUALITY CONTROL PROGRAM.    Category NIL     Comment: Negative for Intraepithelial Lesion   Adequacy SECNI,AOCX     Comment: Satisfactory for evaluation. No endocervical component is identified. The absence of an endocervical component was confirmed by an additional screening evaluation.    Clinician Provided ICD10 Comment     Comment: Z12.4   Performed by: Comment     Comment: Frances Maywood, BS, Cytotechnologist (ASCP)   QC reviewed by: Comment     Comment: Lubertha Basque, Cytotechnologist (ASCP)   Note: Comment     Comment: The Pap smear is a screening test designed to aid in the detection of premalignant and malignant conditions of the uterine cervix.  It is not a diagnostic procedure and should not be used as the sole means of detecting cervical cancer.  Both false-positive and false-negative reports do occur.   Specimen status report     Status: None   Collection Time: 02/25/23 12:00 AM  Result Value Ref Range   specimen status report Comment     Comment: Please note Please note The date and/or time of collection was not indicated on the requisition as required by state and federal law.  The date of receipt of the specimen was used as the collection date if not supplied.   HPV Aptima      Status: None   Collection Time: 02/25/23 12:00 AM  Result Value Ref Range   HPV Aptima Negative Negative    Comment: This nucleic acid amplification test detects fourteen high-risk HPV types (16,18,31,33,35,39,45,51,52,56,58,59,66,68) without differentiation.   Specimen status report     Status: None   Collection Time: 02/25/23 12:00 AM  Result Value Ref Range   specimen status report Comment     Comment: Written Authorization Written Authorization Written Authorization Received. Authorization received from [facility] R 03-03-2023 Logged by Porfirio Mylar   Lipid panel     Status: Abnormal   Collection Time: 03/07/23  9:40 AM  Result Value Ref Range   Cholesterol, Total 220 (H) 100 - 199 mg/dL   Triglycerides 73 0 - 149 mg/dL   HDL 84 >53 mg/dL   VLDL Cholesterol Cal 13 5 - 40 mg/dL   LDL Chol Calc (NIH) 664 (H) 0 - 99 mg/dL   Chol/HDL Ratio 2.6 0.0 - 4.4 ratio    Comment:                                   T. Chol/HDL Ratio                                             Men  Women                               1/2 Avg.Risk  3.4    3.3                                   Avg.Risk  5.0    4.4  2X Avg.Risk  9.6    7.1                                3X Avg.Risk 23.4   11.0   CBC with Differential/Platelet     Status: Abnormal   Collection Time: 03/07/23  9:40 AM  Result Value Ref Range   WBC 3.4 3.4 - 10.8 x10E3/uL   RBC 4.00 3.77 - 5.28 x10E6/uL   Hemoglobin 10.6 (L) 11.1 - 15.9 g/dL   Hematocrit 16.1 09.6 - 46.6 %   MCV 86 79 - 97 fL   MCH 26.5 (L) 26.6 - 33.0 pg   MCHC 30.8 (L) 31.5 - 35.7 g/dL   RDW 04.5 40.9 - 81.1 %   Platelets 196 150 - 450 x10E3/uL   Neutrophils 54 Not Estab. %   Lymphs 30 Not Estab. %   Monocytes 10 Not Estab. %   Eos 5 Not Estab. %   Basos 1 Not Estab. %   Neutrophils Absolute 1.8 1.4 - 7.0 x10E3/uL   Lymphocytes Absolute 1.0 0.7 - 3.1 x10E3/uL   Monocytes Absolute 0.3 0.1 - 0.9 x10E3/uL   EOS (ABSOLUTE) 0.2 0.0 - 0.4  x10E3/uL   Basophils Absolute 0.0 0.0 - 0.2 x10E3/uL   Immature Granulocytes 0 Not Estab. %   Immature Grans (Abs) 0.0 0.0 - 0.1 x10E3/uL  Comprehensive metabolic panel     Status: Abnormal   Collection Time: 03/07/23  9:40 AM  Result Value Ref Range   Glucose 87 70 - 99 mg/dL   BUN 13 8 - 27 mg/dL   Creatinine, Ser 9.14 0.57 - 1.00 mg/dL   eGFR 77 >78 GN/FAO/1.30   BUN/Creatinine Ratio 15 12 - 28   Sodium 139 134 - 144 mmol/L   Potassium 3.8 3.5 - 5.2 mmol/L   Chloride 105 96 - 106 mmol/L   CO2 22 20 - 29 mmol/L   Calcium 9.3 8.7 - 10.3 mg/dL   Total Protein 7.1 6.0 - 8.5 g/dL   Albumin 4.2 3.8 - 4.9 g/dL   Globulin, Total 2.9 1.5 - 4.5 g/dL   Bilirubin Total <8.6 0.0 - 1.2 mg/dL   Alkaline Phosphatase 160 (H) 44 - 121 IU/L   AST 13 0 - 40 IU/L   ALT 9 0 - 32 IU/L  Hemoglobin A1c     Status: Abnormal   Collection Time: 03/07/23  9:40 AM  Result Value Ref Range   Hgb A1c MFr Bld 5.8 (H) 4.8 - 5.6 %    Comment:          Prediabetes: 5.7 - 6.4          Diabetes: >6.4          Glycemic control for adults with diabetes: <7.0    Est. average glucose Bld gHb Est-mCnc 120 mg/dL  V78 and Folate Panel     Status: None   Collection Time: 03/07/23  9:40 AM  Result Value Ref Range   Vitamin B-12 409 232 - 1,245 pg/mL   Folate 10.2 >3.0 ng/mL    Comment: A serum folate concentration of less than 3.1 ng/mL is considered to represent clinical deficiency.   VITAMIN D 25 Hydroxy (Vit-D Deficiency, Fractures)     Status: Abnormal   Collection Time: 03/07/23  9:40 AM  Result Value Ref Range   Vit D, 25-Hydroxy 16.4 (L) 30.0 - 100.0 ng/mL    Comment: Vitamin D deficiency has  been defined by the Institute of Medicine and an Endocrine Society practice guideline as a level of serum 25-OH vitamin D less than 20 ng/mL (1,2). The Endocrine Society went on to further define vitamin D insufficiency as a level between 21 and 29 ng/mL (2). 1. IOM (Institute of Medicine). 2010. Dietary reference     intakes for calcium and D. [place]: The    [employer]. 2. Holick MF, Binkley [place], Bischoff-Ferrari HA, et al.    Evaluation, treatment, and prevention of vitamin D    deficiency: an Endocrine Society clinical practice    guideline. JCEM. 2011 Jul; 96(7):1911-30.   TSH     Status: None   Collection Time: 03/07/23  9:40 AM  Result Value Ref Range   TSH 2.010 0.450 - 4.500 uIU/mL  Carbamazepine Level (Tegretol), total     Status: None   Collection Time: 03/07/23  9:40 AM  Result Value Ref Range   Carbamazepine (Tegretol), S 10.9 4.0 - 12.0 ug/mL    Comment:          In conjunction with other antiepileptic drugs                                Therapeutic  4.0 -  8.0                                Toxicity     9.0 - 12.0                                    Carbamazepine alone                                Therapeutic  8.0 - 12.0                                 Detection Limit =  2.0                           <2.0 indicates None Detected   Levetiracetam level     Status: None   Collection Time: 03/07/23  9:40 AM  Result Value Ref Range   Levetiracetam Lvl 23.1 10.0 - 40.0 ug/mL    Diabetic Foot Exam:     PHQ2/9:    02/24/2023    3:24 PM 10/07/2022    3:18 PM 02/18/2022    3:34 PM 02/04/2022    9:20 AM 09/24/2021    3:20 PM  Depression screen PHQ 2/9  Decreased Interest 0 0 0 0 0  Down, Depressed, Hopeless 0 0 0 0 0  PHQ - 2 Score 0 0 0 0 0  Altered sleeping 0 0 0 0   Tired, decreased energy 0 0 0 0   Change in appetite 0 0 0 0   Feeling bad or failure about yourself  0 0 0 0   Trouble concentrating 0 0 0 0   Moving slowly or fidgety/restless 0 0 0 0   Suicidal thoughts 0 0 0 0   PHQ-9 Score 0 0 0 0   Difficult doing work/chores Not difficult at all  phq 9 is negative   Fall Risk:    02/24/2023    3:12 PM 10/07/2022    3:18 PM 02/04/2022    9:19 AM 09/24/2021    3:20 PM 06/14/2021    9:45 AM  Fall Risk   Falls in the past year? 0 1 0 0  0  Number falls in past yr: 0 0  0 0  Injury with Fall? 0 0  0 0  Risk for fall due to : No Fall Risks No Fall Risks No Fall Risks No Fall Risks   Follow up Falls prevention discussed;Education provided;Falls evaluation completed Falls prevention discussed Falls prevention discussed;Education provided;Falls evaluation completed Falls prevention discussed     Assessment & Plan  Assessment and Plan    Hypertension Well controlled on Valsartan HCTZ 160/12.5mg  and Amlodipine 5mg . No reported side effects or symptoms of hypotension. -Continue current regimen.  Hyperlipidemia 10-year ASCVD risk of 5.6%. No indication for statin therapy based on current guidelines. Discussed lifestyle modifications. -Continue with dietary modifications and physical activity.  Iron Deficiency Anemia Hemoglobin stable at 10.6. Iron and B12 levels normal. Likely anemia of chronic disease. -Monitor hemoglobin levels. If hemoglobin drops below 10, consider referral to hematologist.  Vitamin D Deficiency Patient reported inconsistent use of prescribed Vitamin D. -Refill Vitamin D prescription. Encourage patient to take weekly as prescribed.  Seizure Disorder Seizures well controlled on Keppra, and Tegretol. No recent seizures reported. Slight elevation in alkaline phosphatase likely due to anti-seizure medications. -Continue current regimen. Monitor alkaline phosphatase levels.  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 -Refill Valtrex 500mg . Instruct patient to take 1 tablet three times daily for genital outbreaks and 2 tablets twice daily for oral outbreaks.  Prediabetes A1c stable at 5.8. Discussed dietary modifications. -Continue with dietary modifications and physical activity.  Obesity/Morbid - BMI over 40 Patient has lost weight since last visit. Discussed potential weight loss medications, but patient declined. -Encourage continued physical  activity and healthy diet.  Sleep Apnea Patient reported not using CPAP machine but has no symptoms of sleep apnea. -Monitor for symptoms.  Asthma Mild intermittent asthma. Patient uses Symbicort as needed. -Continue current regimen.  General Health Maintenance -Continue annual follow-ups with neurologist. -Encourage healthy diet for cholesterol management. -Consider daily over-the-counter Vitamin D if patient continues to be non-compliant with weekly prescription.

## 2023-04-04 ENCOUNTER — Other Ambulatory Visit: Payer: Self-pay | Admitting: Family Medicine

## 2023-04-04 DIAGNOSIS — I10 Essential (primary) hypertension: Secondary | ICD-10-CM

## 2023-04-04 NOTE — Telephone Encounter (Signed)
Medication Refill -  Most Recent Primary Care Visit:  Provider: Alba Cory  Department: [facility]  Visit Type: OFFICE VISIT  Date: 03/10/2023  Medication: valsartan-hydrochlorothiazide (DIOVAN-HCT) 160-12.5 MG   Has the patient contacted their pharmacy? Yes (Agent: If no, request that the patient contact the pharmacy for the refill. If patient does not wish to contact the pharmacy document the reason why and proceed with request.) (Agent: If yes, when and what did the pharmacy advise?)  Is this the correct pharmacy for this prescription? Yes If no, delete pharmacy and type the correct one.  This is the patient's preferred pharmacy:  [id number] - [place], [place] - [address] AT [facility] [address] [address] [facility] [address] [place] [address] Phone: [phone] Fax: [phone]   Has the prescription been filled recently? Yes  Is the patient out of the medication? Yes  Has the patient been seen for an appointment in the last year OR does the patient have an upcoming appointment? Yes  Can we respond through MyChart? NO  Agent: Please be advised that Rx refills may take up to 3 business days. We ask that you follow-up with your pharmacy.

## 2023-04-04 NOTE — Telephone Encounter (Signed)
Refilled 03/10/23. Requested Prescriptions  Refused Prescriptions Disp Refills   valsartan-hydrochlorothiazide (DIOVAN-HCT) 160-12.5 MG tablet 90 tablet 1    Sig: Take 1 tablet by mouth daily. In place of telmisartan hctz     Cardiovascular: ARB + Diuretic Combos Passed - 04/04/2023  3:57 PM      Passed - K in normal range and within 180 days    Potassium  Date Value Ref Range Status  03/07/2023 3.8 3.5 - 5.2 mmol/L Final  04/09/2013 3.6 3.5 - 5.1 mmol/L Final         Passed - Na in normal range and within 180 days    Sodium  Date Value Ref Range Status  03/07/2023 139 134 - 144 mmol/L Final  04/09/2013 138 136 - 145 mmol/L Final         Passed - Cr in normal range and within 180 days    Creatinine  Date Value Ref Range Status  04/09/2013 1.01 0.60 - 1.30 mg/dL Final   Creatinine, Ser  Date Value Ref Range Status  03/07/2023 0.86 0.57 - 1.00 mg/dL Final         Passed - eGFR is 10 or above and within 180 days    EGFR (African American)  Date Value Ref Range Status  04/09/2013 >60  Final   GFR calc Af Amer  Date Value Ref Range Status  11/29/2019 91 >59 mL/min/1.73 Final    Comment:    **[facility] currently reports eGFR in compliance with the current**   recommendations of the [employer]. [facility] will   update reporting as new guidelines are published from the NKF-ASN   Task force.    EGFR (Non-African Amer.)  Date Value Ref Range Status  04/09/2013 >60  Final    Comment:    eGFR values <33mL/min/1.73 m2 may be an indication of chronic kidney disease (CKD). Calculated eGFR is useful in patients with stable renal function. The eGFR calculation will not be reliable in acutely ill patients when serum creatinine is changing rapidly. It is not useful in  patients on dialysis. The eGFR calculation may not be applicable to patients at the low and high extremes of body sizes, pregnant women, and vegetarians.    GFR calc non Af Amer  Date Value Ref  Range Status  11/29/2019 79 >59 mL/min/1.73 Final   eGFR  Date Value Ref Range Status  03/07/2023 77 >59 mL/min/1.73 Final         Passed - Patient is not pregnant      Passed - Last BP in normal range    BP Readings from Last 1 Encounters:  03/10/23 126/86         Passed - Valid encounter within last 6 months    Recent Outpatient Visits           3 weeks ago Seizure [facility])   [facility] Alba Cory, MD   1 month ago Well adult exam   [facility] Alba Cory, MD   5 months ago Morbid obesity [facility])   [facility] Alba Cory, MD   1 year ago Seizure [facility])   [facility] Alba Cory, MD   1 year ago Acute non-recurrent frontal sinusitis   [facility] Alba Cory, MD       Future Appointments             In 5 months Alba Cory, MD [facility]  [facility]   In 11 months Carlynn Purl, Danna Hefty, MD [facility] [address]

## 2023-07-02 ENCOUNTER — Encounter: Payer: Self-pay | Admitting: Family Medicine

## 2023-07-02 ENCOUNTER — Ambulatory Visit: Admitting: Family Medicine

## 2023-07-02 VITALS — BP 126/76 | HR 99 | Resp 16 | Ht 64.0 in | Wt 274.6 lb

## 2023-07-02 DIAGNOSIS — H109 Unspecified conjunctivitis: Secondary | ICD-10-CM

## 2023-07-02 DIAGNOSIS — J321 Chronic frontal sinusitis: Secondary | ICD-10-CM

## 2023-07-02 DIAGNOSIS — J4521 Mild intermittent asthma with (acute) exacerbation: Secondary | ICD-10-CM

## 2023-07-02 MED ORDER — AMOXICILLIN-POT CLAVULANATE 875-125 MG PO TABS: 1.0000 | ORAL_TABLET | Freq: Two times a day (BID) | ORAL | 0 refills | Status: DC

## 2023-07-02 MED ORDER — CIPROFLOXACIN HCL 0.3 % OP SOLN: 2.0000 [drp] | OPHTHALMIC | 0 refills | Status: DC

## 2023-07-02 NOTE — Progress Notes (Signed)
 Name: Paula [name]   MRN: [id number]    DOB: [birth year]   Date:07/02/2023       Progress Note  Subjective  Chief Complaint  Chief Complaint  Patient presents with   Cough    Few days   Eye Problem    Crusty red L eye in the mornings    Discussed the use of AI scribe software for clinical note transcription with the patient, who gave verbal consent to proceed.  History of Present Illness She is a 63 year old female with mild intermittent asthma who presents with a dry cough and nasal congestion.  She has been experiencing a dry cough for approximately two weeks. She uses her Symbicort  inhaler, taking two puffs in the morning and at night. The cough is sometimes dry and sometimes productive, with a desire to expel mucus if present. No wheezing is present.  Nasal congestion began over the weekend, approximately three days ago. She used Flonase  nasal spray, borrowed from a friend, to alleviate the congestion. The nasal congestion is accompanied by drainage down the back of her throat, causing mild throat soreness.  Her left eye has been matted shut upon waking for the past two days, with today being worse than yesterday. There is no pain in the eye, but mucus accumulates, particularly in the corner near the lacrimal duct. She does not wear contact lenses, only glasses, and has not used any treatment for her eye symptoms.  She experiences a headache localized to the forehead, which worsens when bending her head down. The sensation is described as pressure rather than pain. No ear pain or significant nasal discharge is present.  She is currently taking a blood pressure medication, which manages her blood pressure well. She confirms that she can take antibiotics and has no known allergies to penicillin.    Patient Active Problem List   Diagnosis Date Noted   GERD without esophagitis 09/24/2021   Pure hypercholesterolemia 09/24/2021   B12 deficiency 09/24/2021   Delayed sleep phase  syndrome 07/03/2017   Pre-diabetes 01/01/2017   Sinus tarsi syndrome of right foot 06/17/2016   Plantar fasciitis, right 06/17/2016   Osteoarthritis of right foot 06/17/2016   Primary osteoarthritis, right ankle and foot 09/13/2015   Carpal tunnel syndrome, right 09/13/2015   Tarsal tunnel syndrome of right side 09/13/2015   Varicose veins of both lower extremities without ulcer or inflammation 09/23/2014   Mild intermittent asthma without complication 08/15/2014   Benign hypertension 08/15/2014   Herpes genitalis in women 08/15/2014   Allergic rhinitis 08/15/2014   Seizure (HCC) 08/15/2014   Vitamin D  deficiency 08/15/2014   Morbid obesity (HCC) 08/03/2014   Adhesive capsulitis of left shoulder 06/16/2013   Obstructive sleep apnea of adult 06/16/2013   Anemia, iron deficiency 09/30/2006    Social History   Tobacco Use   Smoking status: Never   Smokeless tobacco: Never  Substance Use Topics   Alcohol use: No    Alcohol/week: 0.0 standard drinks of alcohol     Current Outpatient Medications:    albuterol  (VENTOLIN  HFA) 108 (90 Base) MCG/ACT inhaler, Inhale 2 puffs into the lungs every 6 (six) hours as needed., Disp: 1 each, Rfl: 0   amLODipine  (NORVASC ) 5 MG tablet, TAKE 1 TABLET(5 MG) BY MOUTH DAILY, Disp: 90 tablet, Rfl: 1   budesonide -formoterol  (SYMBICORT ) 160-4.5 MCG/ACT inhaler, Inhale 2 puffs into the lungs 2 (two) times daily., Disp: 1 each, Rfl: 2   carbamazepine  (TEGRETOL ) 200 MG tablet, TAKE 1 AND 1/2 TABLETS  BY  MOUTH IN THE MORNING AND  NOON AND 2 AND 1/2 TABLETS  BY MOUTH AT NIGHT, Disp: , Rfl:    cetirizine  (ZYRTEC ) 10 MG tablet, Take 1 tablet (10 mg total) by mouth daily., Disp: 90 tablet, Rfl: 1   fluticasone  (FLONASE ) 50 MCG/ACT nasal spray, Place 2 sprays into both nostrils daily., Disp: 48 g, Rfl: 1   levETIRAcetam  (KEPPRA ) 500 MG tablet, Take 3 tablets by mouth 2 (two) times daily., Disp: , Rfl:    PREVIDENT 5000 BOOSTER PLUS 1.1 % PSTE, , Disp: , Rfl:     topiramate  (TOPAMAX ) 100 MG tablet, Take 3 tablets by mouth 3 (three) times daily., Disp: , Rfl:    valACYclovir  (VALTREX ) 500 MG tablet, Take 1 tablet (500 mg total) by mouth 3 (three) times daily. For genital one three times daily for 7 days for lips take two twice a day for one day prn, Disp: 30 tablet, Rfl: 1   valsartan -hydrochlorothiazide  (DIOVAN -HCT) 160-12.5 MG tablet, Take 1 tablet by mouth daily. In place of telmisartan  hctz, Disp: 90 tablet, Rfl: 1   Vitamin D , Ergocalciferol , (DRISDOL ) 1.25 MG (50000 UNIT) CAPS capsule, Take 1 capsule (50,000 Units total) by mouth every 7 (seven) days., Disp: 12 capsule, Rfl: 1  Allergies  Allergen Reactions   Ace Inhibitors     ROS  Ten systems reviewed and is negative except as mentioned in HPI    Objective  Vitals:   07/02/23 1112  BP: 126/76  Pulse: 99  Resp: 16  SpO2: 99%  Weight: 274 lb 9.6 oz (124.6 kg)  Height: 5\' 4"  (1.626 m)    Body mass index is 47.13 kg/m.  Physical Exam CONSTITUTIONAL: Patient appears well-developed and well-nourished. No distress. HEENT: Head atraumatic, normocephalic, neck supple. Erythematous conjunctiva in the periphery  Ears normal. Tender frontal sinus CARDIOVASCULAR: Normal rate, regular rhythm and normal heart sounds. No murmur heard. No BLE edema. PULMONARY: Effort normal and breath sounds normal. Lungs clear to auscultation. No respiratory distress. ABDOMINAL: There is no tenderness or distention. MUSCULOSKELETAL: Normal gait. Without gross motor or sensory deficit. PSYCHIATRIC: Patient has a normal mood and affect. Behavior is normal. Judgment and thought content normal.  Assessment & Plan  Acute Frontal sinusitis Symptoms and duration suggest bacterial sinusitis. Augmentin  preferred for longer antibiotic course. - Prescribe Augmentin  for 10 days, twice a day. - Advise use of plain Mucinex. - Encourage increased fluid intake.  Bacterial conjunctivitis, left eye - Prescribe antibiotic  eye drops for left eye, every two hours on first day, then every four hours. - Advise warm compresses in the morning. - Instruct to prevent contamination of the other eye.  Mild intermittent asthma Exacerbation with dry cough likely due to recent URI. Symbicort  used for control. - Continue Symbicort , two puffs twice a day, until symptoms improve.

## 2023-09-01 ENCOUNTER — Ambulatory Visit: Payer: Self-pay | Admitting: Family Medicine

## 2023-09-01 ENCOUNTER — Encounter: Payer: Self-pay | Admitting: Family Medicine

## 2023-09-01 VITALS — BP 130/76 | HR 96 | Temp 98.5°F | Resp 16 | Ht 64.0 in | Wt 277.0 lb

## 2023-09-01 DIAGNOSIS — I10 Essential (primary) hypertension: Secondary | ICD-10-CM | POA: Diagnosis not present

## 2023-09-01 DIAGNOSIS — E7800 Pure hypercholesterolemia, unspecified: Secondary | ICD-10-CM

## 2023-09-01 DIAGNOSIS — J4520 Mild intermittent asthma, uncomplicated: Secondary | ICD-10-CM

## 2023-09-01 DIAGNOSIS — R569 Unspecified convulsions: Secondary | ICD-10-CM

## 2023-09-01 DIAGNOSIS — G4733 Obstructive sleep apnea (adult) (pediatric): Secondary | ICD-10-CM

## 2023-09-01 DIAGNOSIS — E538 Deficiency of other specified B group vitamins: Secondary | ICD-10-CM

## 2023-09-01 DIAGNOSIS — R7303 Prediabetes: Secondary | ICD-10-CM

## 2023-09-01 DIAGNOSIS — E559 Vitamin D deficiency, unspecified: Secondary | ICD-10-CM

## 2023-09-01 MED ORDER — VITAMIN D (ERGOCALCIFEROL) 1.25 MG (50000 UNIT) PO CAPS: 50000.0000 [IU] | ORAL_CAPSULE | ORAL | 1 refills | Status: AC

## 2023-09-01 MED ORDER — AMLODIPINE BESYLATE 5 MG PO TABS: ORAL_TABLET | ORAL | 1 refills | Status: DC

## 2023-09-01 MED ORDER — VALSARTAN-HYDROCHLOROTHIAZIDE 160-12.5 MG PO TABS: 1.0000 | ORAL_TABLET | Freq: Every day | ORAL | 1 refills | Status: DC

## 2023-09-01 NOTE — Patient Instructions (Signed)
 Lane Arthea Locus, MD  [id number] [facility] [address]  [facility]  [place], [place] [address]  [phone] (Work)  [phone] ([address])    Jonette Lauraine Norris, [place]  [address]  [place], [place] [address]  [phone] (Work)  [phone] (Fax)
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
## 2024-02-27 NOTE — Patient Instructions (Incomplete)
 Preventive Care 58-61 Years Old, Female  Preventive care refers to lifestyle choices and visits with your health care provider that can promote health and wellness. Preventive care visits are also called wellness exams.  What can I expect for my preventive care visit?  Counseling  Your health care provider may ask you questions about your:  Medical history, including:  Past medical problems.  Family medical history.  Pregnancy history.  Current health, including:  Menstrual cycle.  Method of birth control.  Emotional well-being.  Home life and relationship well-being.  Sexual activity and sexual health.  Lifestyle, including:  Alcohol, nicotine or tobacco, and drug use.  Access to firearms.  Diet, exercise, and sleep habits.  Work and work Astronomer.  Sunscreen use.  Safety issues such as seatbelt and bike helmet use.  Physical exam  Your health care provider will check your:  Height and weight. These may be used to calculate your BMI (body mass index). BMI is a measurement that tells if you are at a healthy weight.  Waist circumference. This measures the distance around your waistline. This measurement also tells if you are at a healthy weight and may help predict your risk of certain diseases, such as type 2 diabetes and high blood pressure.  Heart rate and blood pressure.  Body temperature.  Skin for abnormal spots.  What immunizations do I need?    Vaccines are usually given at various ages, according to a schedule. Your health care provider will recommend vaccines for you based on your age, medical history, and lifestyle or other factors, such as travel or where you work.  What tests do I need?  Screening  Your health care provider may recommend screening tests for certain conditions. This may include:  Lipid and cholesterol levels.  Diabetes screening. This is done by checking your blood sugar (glucose) after you have not eaten for a while (fasting).  Pelvic exam and Pap test.  Hepatitis B test.  Hepatitis C  test.  HIV (human immunodeficiency virus) test.  STI (sexually transmitted infection) testing, if you are at risk.  Lung cancer screening.  Colorectal cancer screening.  Mammogram. Talk with your health care provider about when you should start having regular mammograms. This may depend on whether you have a family history of breast cancer.  BRCA-related cancer screening. This may be done if you have a family history of breast, ovarian, tubal, or peritoneal cancers.  Bone density scan. This is done to screen for osteoporosis.  Talk with your health care provider about your test results, treatment options, and if necessary, the need for more tests.  Follow these instructions at home:  Eating and drinking    Eat a diet that includes fresh fruits and vegetables, whole grains, lean protein, and low-fat dairy products.  Take vitamin and mineral supplements as recommended by your health care provider.  Do not drink alcohol if:  Your health care provider tells you not to drink.  You are pregnant, may be pregnant, or are planning to become pregnant.  If you drink alcohol:  Limit how much you have to 0-1 drink a day.  Know how much alcohol is in your drink. In the [place] one drink equals one 12 oz bottle of beer (355 mL), one 5 oz glass of wine (148 mL), or one 1 oz glass of hard liquor (44 mL).  Lifestyle  Brush your teeth every morning and night with fluoride toothpaste. Floss one time each day.  Exercise for at least  30 minutes 5 or more days each week.  Do not use any products that contain nicotine or tobacco. These products include cigarettes, chewing tobacco, and vaping devices, such as e-cigarettes. If you need help quitting, ask your health care provider.  Do not use drugs.  If you are sexually active, practice safe sex. Use a condom or other form of protection to prevent STIs.  If you do not wish to become pregnant, use a form of birth control. If you plan to become pregnant, see your health care provider for a  prepregnancy visit.  Take aspirin only as told by your health care provider. Make sure that you understand how much to take and what form to take. Work with your health care provider to find out whether it is safe and beneficial for you to take aspirin daily.  Find healthy ways to manage stress, such as:  Meditation, yoga, or listening to music.  Journaling.  Talking to a trusted person.  Spending time with friends and family.  Minimize exposure to UV radiation to reduce your risk of skin cancer.  Safety  Always wear your seat belt while driving or riding in a vehicle.  Do not drive:  If you have been drinking alcohol. Do not ride with someone who has been drinking.  When you are tired or distracted.  While texting.  If you have been using any mind-altering substances or drugs.  Wear a helmet and other protective equipment during sports activities.  If you have firearms in your house, make sure you follow all gun safety procedures.  Seek help if you have been physically or sexually abused.  What's next?  Visit your health care provider once a year for an annual wellness visit.  Ask your health care provider how often you should have your eyes and teeth checked.  Stay up to date on all vaccines.  This information is not intended to replace advice given to you by your health care provider. Make sure you discuss any questions you have with your health care provider.  Document Revised: 08/23/2020 Document Reviewed: 08/23/2020  Elsevier Patient Education  2024 [employer].

## 2024-03-01 ENCOUNTER — Ambulatory Visit: Admitting: Family Medicine

## 2024-03-01 ENCOUNTER — Encounter: Payer: 59 | Admitting: Family Medicine

## 2024-03-29 ENCOUNTER — Other Ambulatory Visit: Payer: Self-pay | Admitting: Family Medicine

## 2024-03-29 DIAGNOSIS — I10 Essential (primary) hypertension: Secondary | ICD-10-CM

## 2024-03-29 NOTE — Telephone Encounter (Unsigned)
 Copied from CRM #[id number]. Topic: Clinical - Medication Refill >> Mar 29, 2024  3:04 PM Winona R wrote: Medication: valsartan -hydrochlorothiazide  (DIOVAN -HCT) 160-12.5 MG tablet  Has the patient contacted their pharmacy? No (Agent: If no, request that the patient contact the pharmacy for the refill. If patient does not wish to contact the pharmacy document the reason why and proceed with request.) (Agent: If yes, when and what did the pharmacy advise?)  This is the patient's preferred pharmacy:  [id number] - [place], [place] - [address] AT [facility] [address] [facility] [address] [place] [address] Phone: [phone] Fax: [phone]  Is this the correct pharmacy for this prescription? Yes If no, delete pharmacy and type the correct one.   Has the prescription been filled recently? Yes  Is the patient out of the medication? Yes  Has the patient been seen for an appointment in the last year OR does the patient have an upcoming appointment? Yes  Can we respond through MyChart? No  Agent: Please be advised that Rx refills may take up to 3 business days. We ask that you follow-up with your pharmacy.

## 2024-03-30 MED ORDER — VALSARTAN-HYDROCHLOROTHIAZIDE 160-12.5 MG PO TABS: 1.0000 | ORAL_TABLET | Freq: Every day | ORAL | 0 refills | Status: AC

## 2024-03-30 NOTE — Telephone Encounter (Signed)
 Requested Prescriptions  Pending Prescriptions Disp Refills   valsartan -hydrochlorothiazide  (DIOVAN -HCT) 160-12.5 MG tablet 90 tablet 0    Sig: Take 1 tablet by mouth daily. In place of telmisartan  hctz     Cardiovascular: ARB + Diuretic Combos Failed - 03/30/2024  1:02 PM      Failed - K in normal range and within 180 days    Potassium  Date Value Ref Range Status  03/07/2023 3.8 3.5 - 5.2 mmol/L Final  04/09/2013 3.6 3.5 - 5.1 mmol/L Final         Failed - Na in normal range and within 180 days    Sodium  Date Value Ref Range Status  03/07/2023 139 134 - 144 mmol/L Final  04/09/2013 138 136 - 145 mmol/L Final         Failed - Cr in normal range and within 180 days    Creatinine  Date Value Ref Range Status  04/09/2013 1.01 0.60 - 1.30 mg/dL Final   Creatinine, Ser  Date Value Ref Range Status  03/07/2023 0.86 0.57 - 1.00 mg/dL Final         Failed - eGFR is 10 or above and within 180 days    EGFR (African American)  Date Value Ref Range Status  04/09/2013 >60  Final   GFR calc Af Amer  Date Value Ref Range Status  11/29/2019 91 >59 mL/min/1.73 Final    Comment:    **[facility] currently reports eGFR in compliance with the current**   recommendations of the [employer]. [facility] will   update reporting as new guidelines are published from the NKF-ASN   Task force.    EGFR (Non-African Amer.)  Date Value Ref Range Status  04/09/2013 >60  Final    Comment:    eGFR values <75mL/min/1.73 m2 may be an indication of chronic kidney disease (CKD). Calculated eGFR is useful in patients with stable renal function. The eGFR calculation will not be reliable in acutely ill patients when serum creatinine is changing rapidly. It is not useful in  patients on dialysis. The eGFR calculation may not be applicable to patients at the low and high extremes of body sizes, pregnant women, and vegetarians.    GFR calc non Af Amer  Date Value Ref Range Status   11/29/2019 79 >59 mL/min/1.73 Final   eGFR  Date Value Ref Range Status  03/07/2023 77 >59 mL/min/1.73 Final         Failed - Valid encounter within last 6 months    Recent Outpatient Visits           7 months ago Seizure [facility])   [facility] Glenard Mire, MD   9 months ago Mild intermittent asthma with (acute) exacerbation   [facility] [place], Krichna, MD              Passed - Patient is not pregnant      Passed - Last BP in normal range    BP Readings from Last 1 Encounters:  09/01/23 130/76

## 2024-04-06 ENCOUNTER — Other Ambulatory Visit: Payer: Self-pay | Admitting: Family Medicine

## 2024-04-06 DIAGNOSIS — I10 Essential (primary) hypertension: Secondary | ICD-10-CM

## 2024-04-07 NOTE — Telephone Encounter (Signed)
 Requested Prescriptions  Pending Prescriptions Disp Refills   amLODipine  (NORVASC ) 5 MG tablet [Pharmacy Med Name: AMLODIPINE  BESYLATE 5MG  TABLETS] 90 tablet 0    Sig: TAKE 1 TABLET(5 MG) BY MOUTH DAILY     Cardiovascular: Calcium  Channel Blockers 2 Failed - 04/07/2024  2:53 PM      Failed - Valid encounter within last 6 months    Recent Outpatient Visits           7 months ago Seizure [facility])   [facility] [place], Dorette, MD   9 months ago Mild intermittent asthma with (acute) exacerbation   [facility] [place], Krichna, MD              Passed - Last BP in normal range    BP Readings from Last 1 Encounters:  09/01/23 130/76         Passed - Last Heart Rate in normal range    Pulse Readings from Last 1 Encounters:  09/01/23 96

## 2024-04-08 ENCOUNTER — Encounter: Payer: Self-pay | Admitting: Oncology

## 2024-04-14 NOTE — Patient Instructions (Incomplete)
 Preventive Care 62-62 Years Old, Female  Preventive care refers to lifestyle choices and visits with your health care provider that can promote health and wellness. Preventive care visits are also called wellness exams.  What can I expect for my preventive care visit?  Counseling  Your health care provider may ask you questions about your:  Medical history, including:  Past medical problems.  Family medical history.  Pregnancy history.  Current health, including:  Menstrual cycle.  Method of birth control.  Emotional well-being.  Home life and relationship well-being.  Sexual activity and sexual health.  Lifestyle, including:  Alcohol, nicotine or tobacco, and drug use.  Access to firearms.  Diet, exercise, and sleep habits.  Work and work Astronomer.  Sunscreen use.  Safety issues such as seatbelt and bike helmet use.  Physical exam  Your health care provider will check your:  Height and weight. These may be used to calculate your BMI (body mass index). BMI is a measurement that tells if you are at a healthy weight.  Waist circumference. This measures the distance around your waistline. This measurement also tells if you are at a healthy weight and may help predict your risk of certain diseases, such as type 2 diabetes and high blood pressure.  Heart rate and blood pressure.  Body temperature.  Skin for abnormal spots.  What immunizations do I need?    Vaccines are usually given at various ages, according to a schedule. Your health care provider will recommend vaccines for you based on your age, medical history, and lifestyle or other factors, such as travel or where you work.  What tests do I need?  Screening  Your health care provider may recommend screening tests for certain conditions. This may include:  Lipid and cholesterol levels.  Diabetes screening. This is done by checking your blood sugar (glucose) after you have not eaten for a while (fasting).  Pelvic exam and Pap test.  Hepatitis B test.  Hepatitis C  test.  HIV (human immunodeficiency virus) test.  STI (sexually transmitted infection) testing, if you are at risk.  Lung cancer screening.  Colorectal cancer screening.  Mammogram. Talk with your health care provider about when you should start having regular mammograms. This may depend on whether you have a family history of breast cancer.  BRCA-related cancer screening. This may be done if you have a family history of breast, ovarian, tubal, or peritoneal cancers.  Bone density scan. This is done to screen for osteoporosis.  Talk with your health care provider about your test results, treatment options, and if necessary, the need for more tests.  Follow these instructions at home:  Eating and drinking    Eat a diet that includes fresh fruits and vegetables, whole grains, lean protein, and low-fat dairy products.  Take vitamin and mineral supplements as recommended by your health care provider.  Do not drink alcohol if:  Your health care provider tells you not to drink.  You are pregnant, may be pregnant, or are planning to become pregnant.  If you drink alcohol:  Limit how much you have to 0-1 drink a day.  Know how much alcohol is in your drink. In the [place] one drink equals one 12 oz bottle of beer (355 mL), one 5 oz glass of wine (148 mL), or one 1 oz glass of hard liquor (44 mL).  Lifestyle  Brush your teeth every morning and night with fluoride toothpaste. Floss one time each day.  Exercise for at least  30 minutes 5 or more days each week.  Do not use any products that contain nicotine or tobacco. These products include cigarettes, chewing tobacco, and vaping devices, such as e-cigarettes. If you need help quitting, ask your health care provider.  Do not use drugs.  If you are sexually active, practice safe sex. Use a condom or other form of protection to prevent STIs.  If you do not wish to become pregnant, use a form of birth control. If you plan to become pregnant, see your health care provider for a  prepregnancy visit.  Take aspirin only as told by your health care provider. Make sure that you understand how much to take and what form to take. Work with your health care provider to find out whether it is safe and beneficial for you to take aspirin daily.  Find healthy ways to manage stress, such as:  Meditation, yoga, or listening to music.  Journaling.  Talking to a trusted person.  Spending time with friends and family.  Minimize exposure to UV radiation to reduce your risk of skin cancer.  Safety  Always wear your seat belt while driving or riding in a vehicle.  Do not drive:  If you have been drinking alcohol. Do not ride with someone who has been drinking.  When you are tired or distracted.  While texting.  If you have been using any mind-altering substances or drugs.  Wear a helmet and other protective equipment during sports activities.  If you have firearms in your house, make sure you follow all gun safety procedures.  Seek help if you have been physically or sexually abused.  What's next?  Visit your health care provider once a year for an annual wellness visit.  Ask your health care provider how often you should have your eyes and teeth checked.  Stay up to date on all vaccines.  This information is not intended to replace advice given to you by your health care provider. Make sure you discuss any questions you have with your health care provider.  Document Revised: 08/23/2020 Document Reviewed: 08/23/2020  Elsevier Patient Education  2024 [employer].

## 2024-04-15 ENCOUNTER — Encounter: Admitting: Family Medicine

## 2024-05-17 ENCOUNTER — Encounter: Payer: Self-pay | Admitting: Family Medicine
